# Patient Record
Sex: Female | Born: 1935
Health system: Southern US, Community
[De-identification: ages and names within clinical notes are randomized; demographics above are authoritative.]

## PROBLEM LIST (undated history)

## (undated) DIAGNOSIS — T8859XA Other complications of anesthesia, initial encounter: Secondary | ICD-10-CM

## (undated) DIAGNOSIS — Z8489 Family history of other specified conditions: Secondary | ICD-10-CM

## (undated) DIAGNOSIS — E079 Disorder of thyroid, unspecified: Secondary | ICD-10-CM

## (undated) DIAGNOSIS — I7300 Raynaud's syndrome without gangrene: Secondary | ICD-10-CM

## (undated) DIAGNOSIS — Z8619 Personal history of other infectious and parasitic diseases: Secondary | ICD-10-CM

## (undated) DIAGNOSIS — I10 Essential (primary) hypertension: Secondary | ICD-10-CM

## (undated) DIAGNOSIS — T4145XA Adverse effect of unspecified anesthetic, initial encounter: Secondary | ICD-10-CM

## (undated) DIAGNOSIS — I8390 Asymptomatic varicose veins of unspecified lower extremity: Secondary | ICD-10-CM

## (undated) DIAGNOSIS — M722 Plantar fascial fibromatosis: Secondary | ICD-10-CM

## (undated) DIAGNOSIS — C50919 Malignant neoplasm of unspecified site of unspecified female breast: Secondary | ICD-10-CM

## (undated) DIAGNOSIS — M549 Dorsalgia, unspecified: Secondary | ICD-10-CM

## (undated) DIAGNOSIS — J189 Pneumonia, unspecified organism: Secondary | ICD-10-CM

## (undated) DIAGNOSIS — E039 Hypothyroidism, unspecified: Secondary | ICD-10-CM

## (undated) DIAGNOSIS — K589 Irritable bowel syndrome without diarrhea: Secondary | ICD-10-CM

## (undated) DIAGNOSIS — J449 Chronic obstructive pulmonary disease, unspecified: Secondary | ICD-10-CM

## (undated) DIAGNOSIS — K219 Gastro-esophageal reflux disease without esophagitis: Secondary | ICD-10-CM

## (undated) HISTORY — DX: Raynaud's syndrome without gangrene: I73.00

## (undated) HISTORY — PX: COLONOSCOPY: SHX174

## (undated) HISTORY — DX: Chronic obstructive pulmonary disease, unspecified: J44.9

## (undated) HISTORY — DX: Essential (primary) hypertension: I10

## (undated) HISTORY — DX: Malignant neoplasm of unspecified site of unspecified female breast: C50.919

## (undated) HISTORY — DX: Plantar fascial fibromatosis: M72.2

## (undated) HISTORY — DX: Irritable bowel syndrome, unspecified: K58.9

## (undated) HISTORY — DX: Hemochromatosis, unspecified: E83.119

## (undated) HISTORY — PX: CATARACT EXTRACTION: SUR2

## (undated) HISTORY — PX: APPENDECTOMY: SHX54

## (undated) HISTORY — DX: Dorsalgia, unspecified: M54.9

## (undated) HISTORY — DX: Disorder of thyroid, unspecified: E07.9

---

## 1968-04-13 HISTORY — PX: OTHER SURGICAL HISTORY: SHX169

## 1977-04-13 HISTORY — PX: ABDOMINAL HYSTERECTOMY: SHX81

## 1989-04-13 HISTORY — PX: LUMBAR LAMINECTOMY: SHX95

## 1992-04-13 HISTORY — PX: MASTECTOMY: SHX3

## 1999-10-30 ENCOUNTER — Other Ambulatory Visit: Admission: RE | Admit: 1999-10-30 | Discharge: 1999-10-30 | Payer: Self-pay | Admitting: *Deleted

## 2000-01-06 ENCOUNTER — Other Ambulatory Visit: Admission: RE | Admit: 2000-01-06 | Discharge: 2000-01-06 | Payer: Self-pay | Admitting: Ophthalmology

## 2000-01-17 ENCOUNTER — Inpatient Hospital Stay (HOSPITAL_COMMUNITY): Admission: EM | Admit: 2000-01-17 | Discharge: 2000-01-19 | Payer: Self-pay | Admitting: Emergency Medicine

## 2000-01-17 ENCOUNTER — Encounter: Payer: Self-pay | Admitting: Emergency Medicine

## 2000-01-19 ENCOUNTER — Encounter: Payer: Self-pay | Admitting: Cardiovascular Disease

## 2000-01-19 ENCOUNTER — Encounter: Payer: Self-pay | Admitting: Internal Medicine

## 2000-06-07 ENCOUNTER — Ambulatory Visit (HOSPITAL_COMMUNITY): Admission: RE | Admit: 2000-06-07 | Discharge: 2000-06-07 | Payer: Self-pay | Admitting: Endocrinology

## 2000-06-07 ENCOUNTER — Encounter: Payer: Self-pay | Admitting: Endocrinology

## 2001-01-05 ENCOUNTER — Other Ambulatory Visit: Admission: RE | Admit: 2001-01-05 | Discharge: 2001-01-05 | Payer: Self-pay | Admitting: *Deleted

## 2001-08-17 ENCOUNTER — Encounter: Admission: RE | Admit: 2001-08-17 | Discharge: 2001-08-17 | Payer: Self-pay | Admitting: Gastroenterology

## 2001-08-17 ENCOUNTER — Encounter: Payer: Self-pay | Admitting: Gastroenterology

## 2001-08-23 ENCOUNTER — Encounter: Payer: Self-pay | Admitting: Gastroenterology

## 2001-08-23 ENCOUNTER — Ambulatory Visit (HOSPITAL_COMMUNITY): Admission: RE | Admit: 2001-08-23 | Discharge: 2001-08-23 | Payer: Self-pay | Admitting: Gastroenterology

## 2001-10-11 ENCOUNTER — Encounter (HOSPITAL_COMMUNITY): Admission: RE | Admit: 2001-10-11 | Discharge: 2002-01-09 | Payer: Self-pay | Admitting: Gastroenterology

## 2002-01-10 ENCOUNTER — Encounter (HOSPITAL_COMMUNITY): Admission: RE | Admit: 2002-01-10 | Discharge: 2002-04-10 | Payer: Self-pay | Admitting: Gastroenterology

## 2002-04-11 ENCOUNTER — Encounter (HOSPITAL_COMMUNITY): Admission: RE | Admit: 2002-04-11 | Discharge: 2002-07-10 | Payer: Self-pay | Admitting: Gastroenterology

## 2002-08-01 ENCOUNTER — Encounter (HOSPITAL_COMMUNITY): Admission: RE | Admit: 2002-08-01 | Discharge: 2002-10-30 | Payer: Self-pay | Admitting: Gastroenterology

## 2002-11-14 ENCOUNTER — Encounter (HOSPITAL_COMMUNITY): Admission: RE | Admit: 2002-11-14 | Discharge: 2003-02-12 | Payer: Self-pay | Admitting: Gastroenterology

## 2003-02-27 ENCOUNTER — Encounter (HOSPITAL_COMMUNITY): Admission: RE | Admit: 2003-02-27 | Discharge: 2003-05-28 | Payer: Self-pay | Admitting: Gastroenterology

## 2003-03-13 ENCOUNTER — Ambulatory Visit (HOSPITAL_COMMUNITY): Admission: RE | Admit: 2003-03-13 | Discharge: 2003-03-13 | Payer: Self-pay | Admitting: Internal Medicine

## 2003-03-29 ENCOUNTER — Other Ambulatory Visit: Admission: RE | Admit: 2003-03-29 | Discharge: 2003-03-29 | Payer: Self-pay | Admitting: *Deleted

## 2003-07-19 ENCOUNTER — Encounter (HOSPITAL_COMMUNITY): Admission: RE | Admit: 2003-07-19 | Discharge: 2003-10-17 | Payer: Self-pay | Admitting: Gastroenterology

## 2003-11-14 ENCOUNTER — Encounter (HOSPITAL_COMMUNITY): Admission: RE | Admit: 2003-11-14 | Discharge: 2004-02-12 | Payer: Self-pay | Admitting: Gastroenterology

## 2004-03-14 ENCOUNTER — Ambulatory Visit (HOSPITAL_COMMUNITY): Admission: RE | Admit: 2004-03-14 | Discharge: 2004-03-14 | Payer: Self-pay

## 2004-04-09 ENCOUNTER — Encounter: Admission: RE | Admit: 2004-04-09 | Discharge: 2004-04-09 | Payer: Self-pay | Admitting: Gastroenterology

## 2004-04-18 ENCOUNTER — Ambulatory Visit: Payer: Self-pay | Admitting: Oncology

## 2004-08-06 ENCOUNTER — Ambulatory Visit: Payer: Self-pay | Admitting: Oncology

## 2004-09-02 ENCOUNTER — Ambulatory Visit: Payer: Self-pay | Admitting: Internal Medicine

## 2004-09-05 ENCOUNTER — Ambulatory Visit (HOSPITAL_COMMUNITY): Admission: RE | Admit: 2004-09-05 | Discharge: 2004-09-05 | Payer: Self-pay | Admitting: Internal Medicine

## 2004-10-21 ENCOUNTER — Ambulatory Visit: Payer: Self-pay | Admitting: Internal Medicine

## 2004-12-26 ENCOUNTER — Ambulatory Visit: Payer: Self-pay | Admitting: Oncology

## 2005-03-09 ENCOUNTER — Ambulatory Visit: Payer: Self-pay | Admitting: Oncology

## 2005-04-24 ENCOUNTER — Ambulatory Visit: Payer: Self-pay | Admitting: Internal Medicine

## 2005-06-30 ENCOUNTER — Ambulatory Visit: Payer: Self-pay | Admitting: Oncology

## 2005-11-30 ENCOUNTER — Ambulatory Visit: Payer: Self-pay | Admitting: Oncology

## 2005-12-04 LAB — COMPREHENSIVE METABOLIC PANEL
ALT: 10 U/L (ref 0–40)
CO2: 26 mEq/L (ref 19–32)
Creatinine, Ser: 0.72 mg/dL (ref 0.40–1.20)
Total Bilirubin: 0.6 mg/dL (ref 0.3–1.2)

## 2005-12-04 LAB — CBC WITH DIFFERENTIAL/PLATELET
BASO%: 0.4 % (ref 0.0–2.0)
Eosinophils Absolute: 0.3 10*3/uL (ref 0.0–0.5)
LYMPH%: 22.2 % (ref 14.0–48.0)
MCH: 32.7 pg (ref 26.0–34.0)
MCHC: 35 g/dL (ref 32.0–36.0)
MCV: 93.4 fL (ref 81.0–101.0)
MONO%: 9.4 % (ref 0.0–13.0)
Platelets: 283 10*3/uL (ref 145–400)
RBC: 4.47 10*6/uL (ref 3.70–5.32)

## 2005-12-04 LAB — FERRITIN: Ferritin: 43 ng/mL (ref 10–291)

## 2006-01-04 ENCOUNTER — Ambulatory Visit: Payer: Self-pay | Admitting: Internal Medicine

## 2006-01-07 ENCOUNTER — Ambulatory Visit: Payer: Self-pay | Admitting: Internal Medicine

## 2006-01-27 ENCOUNTER — Ambulatory Visit: Payer: Self-pay | Admitting: Internal Medicine

## 2006-03-30 ENCOUNTER — Ambulatory Visit: Payer: Self-pay | Admitting: Oncology

## 2006-04-02 LAB — IRON AND TIBC
%SAT: 56 % — ABNORMAL HIGH (ref 20–55)
Iron: 167 ug/dL — ABNORMAL HIGH (ref 42–145)
UIBC: 133 ug/dL

## 2006-04-02 LAB — CBC WITH DIFFERENTIAL/PLATELET
Eosinophils Absolute: 0.2 10*3/uL (ref 0.0–0.5)
MONO#: 0.6 10*3/uL (ref 0.1–0.9)
MONO%: 8 % (ref 0.0–13.0)
NEUT#: 5.1 10*3/uL (ref 1.5–6.5)
RBC: 4.69 10*6/uL (ref 3.70–5.32)
RDW: 12.2 % (ref 11.3–14.5)
WBC: 7.3 10*3/uL (ref 3.9–10.0)

## 2006-04-02 LAB — COMPREHENSIVE METABOLIC PANEL
ALT: 16 U/L (ref 0–35)
CO2: 31 mEq/L (ref 19–32)
Creatinine, Ser: 0.71 mg/dL (ref 0.40–1.20)
Total Bilirubin: 0.7 mg/dL (ref 0.3–1.2)

## 2006-04-02 LAB — FERRITIN: Ferritin: 63 ng/mL (ref 10–291)

## 2006-10-05 ENCOUNTER — Ambulatory Visit: Payer: Self-pay | Admitting: Oncology

## 2006-10-08 LAB — CBC WITH DIFFERENTIAL/PLATELET
Basophils Absolute: 0 10*3/uL (ref 0.0–0.1)
Eosinophils Absolute: 0.2 10*3/uL (ref 0.0–0.5)
HCT: 39.1 % (ref 34.8–46.6)
LYMPH%: 19.8 % (ref 14.0–48.0)
MCHC: 35.7 g/dL (ref 32.0–36.0)
MONO#: 0.5 10*3/uL (ref 0.1–0.9)
NEUT%: 68.7 % (ref 39.6–76.8)
Platelets: 250 10*3/uL (ref 145–400)
WBC: 6.5 10*3/uL (ref 3.9–10.0)

## 2007-01-06 ENCOUNTER — Encounter: Payer: Self-pay | Admitting: *Deleted

## 2007-01-06 DIAGNOSIS — K589 Irritable bowel syndrome without diarrhea: Secondary | ICD-10-CM | POA: Insufficient documentation

## 2007-01-06 DIAGNOSIS — M549 Dorsalgia, unspecified: Secondary | ICD-10-CM | POA: Insufficient documentation

## 2007-01-06 DIAGNOSIS — Z9079 Acquired absence of other genital organ(s): Secondary | ICD-10-CM | POA: Insufficient documentation

## 2007-01-06 DIAGNOSIS — E039 Hypothyroidism, unspecified: Secondary | ICD-10-CM | POA: Insufficient documentation

## 2007-01-06 DIAGNOSIS — Z9010 Acquired absence of unspecified breast and nipple: Secondary | ICD-10-CM | POA: Insufficient documentation

## 2007-01-06 DIAGNOSIS — C50919 Malignant neoplasm of unspecified site of unspecified female breast: Secondary | ICD-10-CM | POA: Insufficient documentation

## 2007-01-06 DIAGNOSIS — I10 Essential (primary) hypertension: Secondary | ICD-10-CM | POA: Insufficient documentation

## 2007-03-01 ENCOUNTER — Telehealth: Payer: Self-pay | Admitting: Internal Medicine

## 2007-03-18 ENCOUNTER — Encounter: Payer: Self-pay | Admitting: Internal Medicine

## 2007-03-25 ENCOUNTER — Ambulatory Visit: Payer: Self-pay | Admitting: Internal Medicine

## 2007-03-25 LAB — CONVERTED CEMR LAB
BUN: 22 mg/dL (ref 6–23)
Basophils Absolute: 0 10*3/uL (ref 0.0–0.1)
Basophils Relative: 0.6 % (ref 0.0–1.0)
CO2: 31 meq/L (ref 19–32)
Calcium: 9.3 mg/dL (ref 8.4–10.5)
Chloride: 100 meq/L (ref 96–112)
Creatinine, Ser: 0.9 mg/dL (ref 0.4–1.2)
Eosinophils Absolute: 0.3 10*3/uL (ref 0.0–0.6)
Eosinophils Relative: 4.4 % (ref 0.0–5.0)
Ferritin: 82.6 ng/mL (ref 10.0–291.0)
Free T4: 1 ng/dL (ref 0.6–1.6)
GFR calc Af Amer: 79 mL/min
GFR calc non Af Amer: 65 mL/min
Glucose, Bld: 103 mg/dL — ABNORMAL HIGH (ref 70–99)
HCT: 43.6 % (ref 36.0–46.0)
Hemoglobin: 15.2 g/dL — ABNORMAL HIGH (ref 12.0–15.0)
Lymphocytes Relative: 24.4 % (ref 12.0–46.0)
MCHC: 34.8 g/dL (ref 30.0–36.0)
MCV: 95.5 fL (ref 78.0–100.0)
Monocytes Absolute: 0.7 10*3/uL (ref 0.2–0.7)
Monocytes Relative: 11.1 % — ABNORMAL HIGH (ref 3.0–11.0)
Neutro Abs: 3.5 10*3/uL (ref 1.4–7.7)
Neutrophils Relative %: 59.5 % (ref 43.0–77.0)
Platelets: 289 10*3/uL (ref 150–400)
Potassium: 3.7 meq/L (ref 3.5–5.1)
RBC: 4.56 M/uL (ref 3.87–5.11)
RDW: 11.6 % (ref 11.5–14.6)
Sodium: 139 meq/L (ref 135–145)
TSH: 7.31 microintl units/mL — ABNORMAL HIGH (ref 0.35–5.50)
WBC: 6 10*3/uL (ref 4.5–10.5)

## 2007-03-27 ENCOUNTER — Encounter: Payer: Self-pay | Admitting: Internal Medicine

## 2007-03-31 ENCOUNTER — Ambulatory Visit: Payer: Self-pay | Admitting: Internal Medicine

## 2007-04-12 ENCOUNTER — Ambulatory Visit: Payer: Self-pay | Admitting: Oncology

## 2007-04-15 ENCOUNTER — Encounter: Payer: Self-pay | Admitting: Internal Medicine

## 2007-04-29 ENCOUNTER — Telehealth: Payer: Self-pay | Admitting: Internal Medicine

## 2007-05-06 ENCOUNTER — Encounter: Payer: Self-pay | Admitting: Internal Medicine

## 2007-05-09 ENCOUNTER — Encounter: Admission: RE | Admit: 2007-05-09 | Discharge: 2007-05-09 | Payer: Self-pay | Admitting: *Deleted

## 2007-06-06 ENCOUNTER — Encounter: Payer: Self-pay | Admitting: Internal Medicine

## 2007-06-28 ENCOUNTER — Ambulatory Visit: Payer: Self-pay | Admitting: Internal Medicine

## 2007-06-28 DIAGNOSIS — R55 Syncope and collapse: Secondary | ICD-10-CM | POA: Insufficient documentation

## 2007-07-06 ENCOUNTER — Ambulatory Visit: Payer: Self-pay | Admitting: Oncology

## 2007-07-13 LAB — COMPREHENSIVE METABOLIC PANEL
Albumin: 4.4 g/dL (ref 3.5–5.2)
CO2: 32 mEq/L (ref 19–32)
Calcium: 9.8 mg/dL (ref 8.4–10.5)
Glucose, Bld: 130 mg/dL — ABNORMAL HIGH (ref 70–99)
Potassium: 3.7 mEq/L (ref 3.5–5.3)
Sodium: 141 mEq/L (ref 135–145)
Total Protein: 6.7 g/dL (ref 6.0–8.3)

## 2007-07-15 ENCOUNTER — Encounter: Payer: Self-pay | Admitting: Internal Medicine

## 2007-07-20 ENCOUNTER — Ambulatory Visit: Payer: Self-pay | Admitting: Internal Medicine

## 2007-07-21 ENCOUNTER — Encounter: Payer: Self-pay | Admitting: Internal Medicine

## 2007-08-05 ENCOUNTER — Encounter: Payer: Self-pay | Admitting: Internal Medicine

## 2007-08-08 ENCOUNTER — Encounter: Payer: Self-pay | Admitting: Internal Medicine

## 2007-09-12 ENCOUNTER — Ambulatory Visit: Payer: Self-pay | Admitting: Oncology

## 2007-09-14 LAB — CBC WITH DIFFERENTIAL/PLATELET
BASO%: 0.3 % (ref 0.0–2.0)
EOS%: 4.5 % (ref 0.0–7.0)
LYMPH%: 20.7 % (ref 14.0–48.0)
MCH: 33 pg (ref 26.0–34.0)
MCHC: 35.7 g/dL (ref 32.0–36.0)
MONO#: 0.4 10*3/uL (ref 0.1–0.9)
RBC: 4.68 10*6/uL (ref 3.70–5.32)
WBC: 5.7 10*3/uL (ref 3.9–10.0)
lymph#: 1.2 10*3/uL (ref 0.9–3.3)

## 2007-09-14 LAB — COMPREHENSIVE METABOLIC PANEL
ALT: 13 U/L (ref 0–35)
AST: 19 U/L (ref 0–37)
Alkaline Phosphatase: 76 U/L (ref 39–117)
BUN: 21 mg/dL (ref 6–23)
Calcium: 9.1 mg/dL (ref 8.4–10.5)
Chloride: 103 mEq/L (ref 96–112)
Creatinine, Ser: 0.76 mg/dL (ref 0.40–1.20)
Potassium: 3.8 mEq/L (ref 3.5–5.3)

## 2007-09-14 LAB — FERRITIN: Ferritin: 32 ng/mL (ref 10–291)

## 2007-10-18 ENCOUNTER — Encounter: Payer: Self-pay | Admitting: Internal Medicine

## 2007-10-18 ENCOUNTER — Encounter: Admission: RE | Admit: 2007-10-18 | Discharge: 2007-10-18 | Payer: Self-pay | Admitting: Gastroenterology

## 2007-11-18 ENCOUNTER — Encounter: Payer: Self-pay | Admitting: Internal Medicine

## 2008-01-11 ENCOUNTER — Ambulatory Visit: Payer: Self-pay | Admitting: Oncology

## 2008-01-13 LAB — CBC WITH DIFFERENTIAL/PLATELET
EOS%: 5.8 % (ref 0.0–7.0)
LYMPH%: 19.8 % (ref 14.0–48.0)
MCH: 33.4 pg (ref 26.0–34.0)
MCHC: 35.5 g/dL (ref 32.0–36.0)
MCV: 94.1 fL (ref 81.0–101.0)
MONO%: 9 % (ref 0.0–13.0)
RBC: 4.74 10*6/uL (ref 3.70–5.32)
RDW: 12.6 % (ref 11.3–14.5)

## 2008-01-13 LAB — FERRITIN: Ferritin: 69 ng/mL (ref 10–291)

## 2008-01-18 ENCOUNTER — Other Ambulatory Visit: Admission: RE | Admit: 2008-01-18 | Discharge: 2008-01-18 | Payer: Self-pay | Admitting: Obstetrics and Gynecology

## 2008-02-15 ENCOUNTER — Ambulatory Visit (HOSPITAL_COMMUNITY): Admission: RE | Admit: 2008-02-15 | Discharge: 2008-02-15 | Payer: Self-pay | Admitting: Obstetrics and Gynecology

## 2008-03-21 ENCOUNTER — Ambulatory Visit: Payer: Self-pay | Admitting: Internal Medicine

## 2008-03-22 ENCOUNTER — Telehealth: Payer: Self-pay | Admitting: Internal Medicine

## 2008-05-16 ENCOUNTER — Ambulatory Visit: Payer: Self-pay | Admitting: Oncology

## 2008-05-18 LAB — COMPREHENSIVE METABOLIC PANEL
ALT: 19 U/L (ref 0–35)
AST: 20 U/L (ref 0–37)
Albumin: 4.2 g/dL (ref 3.5–5.2)
Alkaline Phosphatase: 92 U/L (ref 39–117)
BUN: 17 mg/dL (ref 6–23)
Chloride: 98 mEq/L (ref 96–112)
Potassium: 3.6 mEq/L (ref 3.5–5.3)
Sodium: 138 mEq/L (ref 135–145)

## 2008-05-18 LAB — CBC WITH DIFFERENTIAL/PLATELET
BASO%: 0.4 % (ref 0.0–2.0)
EOS%: 2.2 % (ref 0.0–7.0)
MCH: 32.5 pg (ref 26.0–34.0)
MCHC: 34.6 g/dL (ref 32.0–36.0)
MCV: 93.9 fL (ref 81.0–101.0)
MONO%: 4.9 % (ref 0.0–13.0)
RBC: 4.71 10*6/uL (ref 3.70–5.32)
RDW: 12.2 % (ref 11.3–14.5)
lymph#: 1.1 10*3/uL (ref 0.9–3.3)

## 2008-05-21 ENCOUNTER — Encounter: Payer: Self-pay | Admitting: Internal Medicine

## 2008-05-23 ENCOUNTER — Encounter: Payer: Self-pay | Admitting: Internal Medicine

## 2008-09-17 ENCOUNTER — Ambulatory Visit: Payer: Self-pay | Admitting: Oncology

## 2008-09-19 LAB — FERRITIN: Ferritin: 48 ng/mL (ref 10–291)

## 2008-11-19 ENCOUNTER — Encounter: Payer: Self-pay | Admitting: Internal Medicine

## 2009-01-07 ENCOUNTER — Encounter: Payer: Self-pay | Admitting: Internal Medicine

## 2009-01-14 ENCOUNTER — Ambulatory Visit: Payer: Self-pay | Admitting: Oncology

## 2009-01-16 LAB — FERRITIN: Ferritin: 83 ng/mL (ref 10–291)

## 2009-02-14 ENCOUNTER — Telehealth: Payer: Self-pay | Admitting: Internal Medicine

## 2009-02-15 ENCOUNTER — Ambulatory Visit: Payer: Self-pay | Admitting: Internal Medicine

## 2009-02-15 LAB — CONVERTED CEMR LAB
BUN: 15 mg/dL (ref 6–23)
CO2: 30 meq/L (ref 19–32)
Calcium: 8.9 mg/dL (ref 8.4–10.5)
Chloride: 102 meq/L (ref 96–112)
Creatinine, Ser: 0.7 mg/dL (ref 0.4–1.2)
Free T4: 1 ng/dL (ref 0.6–1.6)
GFR calc non Af Amer: 86.44 mL/min (ref >60)
Glucose, Bld: 87 mg/dL (ref 70–99)
Potassium: 4.1 meq/L (ref 3.5–5.1)
Sodium: 142 meq/L (ref 135–145)
TSH: 1.22 microintl units/mL (ref 0.35–5.50)
Vit D, 25-Hydroxy: 47 ng/mL (ref 30–89)
Vitamin B-12: 578 pg/mL (ref 211–911)

## 2009-02-18 ENCOUNTER — Ambulatory Visit: Payer: Self-pay | Admitting: Internal Medicine

## 2009-02-18 DIAGNOSIS — L659 Nonscarring hair loss, unspecified: Secondary | ICD-10-CM | POA: Insufficient documentation

## 2009-03-20 ENCOUNTER — Encounter: Payer: Self-pay | Admitting: Internal Medicine

## 2009-05-13 ENCOUNTER — Ambulatory Visit: Payer: Self-pay | Admitting: Oncology

## 2009-05-15 ENCOUNTER — Encounter: Payer: Self-pay | Admitting: Internal Medicine

## 2009-05-15 LAB — CBC WITH DIFFERENTIAL/PLATELET
BASO%: 0.2 % (ref 0.0–2.0)
Basophils Absolute: 0 10*3/uL (ref 0.0–0.1)
EOS%: 1.8 % (ref 0.0–7.0)
Eosinophils Absolute: 0.2 10*3/uL (ref 0.0–0.5)
HCT: 44.5 % (ref 34.8–46.6)
HGB: 15.2 g/dL (ref 11.6–15.9)
LYMPH%: 12 % — ABNORMAL LOW (ref 14.0–49.7)
MCH: 33 pg (ref 25.1–34.0)
MCHC: 34.2 g/dL (ref 31.5–36.0)
MCV: 96.4 fL (ref 79.5–101.0)
MONO#: 0.5 10*3/uL (ref 0.1–0.9)
MONO%: 5.1 % (ref 0.0–14.0)
NEUT#: 8 10*3/uL — ABNORMAL HIGH (ref 1.5–6.5)
NEUT%: 80.9 % — ABNORMAL HIGH (ref 38.4–76.8)
Platelets: 272 10*3/uL (ref 145–400)
RBC: 4.62 10*6/uL (ref 3.70–5.45)
RDW: 12.1 % (ref 11.2–14.5)
WBC: 9.9 10*3/uL (ref 3.9–10.3)
lymph#: 1.2 10*3/uL (ref 0.9–3.3)

## 2009-05-15 LAB — COMPREHENSIVE METABOLIC PANEL
ALT: 15 U/L (ref 0–35)
AST: 24 U/L (ref 0–37)
Albumin: 4.1 g/dL (ref 3.5–5.2)
Alkaline Phosphatase: 89 U/L (ref 39–117)
BUN: 16 mg/dL (ref 6–23)
CO2: 26 mEq/L (ref 19–32)
Calcium: 9 mg/dL (ref 8.4–10.5)
Chloride: 99 mEq/L (ref 96–112)
Creatinine, Ser: 0.67 mg/dL (ref 0.40–1.20)
Glucose, Bld: 95 mg/dL (ref 70–99)
Potassium: 3.9 mEq/L (ref 3.5–5.3)
Sodium: 138 mEq/L (ref 135–145)
Total Bilirubin: 0.8 mg/dL (ref 0.3–1.2)
Total Protein: 6.9 g/dL (ref 6.0–8.3)

## 2009-05-15 LAB — FERRITIN: Ferritin: 88 ng/mL (ref 10–291)

## 2009-05-22 ENCOUNTER — Encounter: Payer: Self-pay | Admitting: Internal Medicine

## 2009-07-04 ENCOUNTER — Ambulatory Visit: Payer: Self-pay | Admitting: Oncology

## 2009-07-04 LAB — FERRITIN: Ferritin: 38 ng/mL (ref 10–291)

## 2009-10-11 ENCOUNTER — Ambulatory Visit: Payer: Self-pay | Admitting: Oncology

## 2009-10-16 LAB — FERRITIN: Ferritin: 51 ng/mL (ref 10–291)

## 2009-11-25 ENCOUNTER — Encounter: Payer: Self-pay | Admitting: Internal Medicine

## 2009-12-24 ENCOUNTER — Ambulatory Visit: Payer: Self-pay | Admitting: Internal Medicine

## 2009-12-24 DIAGNOSIS — R109 Unspecified abdominal pain: Secondary | ICD-10-CM | POA: Insufficient documentation

## 2009-12-24 LAB — CONVERTED CEMR LAB
BUN: 18 mg/dL (ref 6–23)
Basophils Absolute: 0 10*3/uL (ref 0.0–0.1)
Basophils Relative: 0.3 % (ref 0.0–3.0)
Bilirubin Urine: NEGATIVE
Blood in Urine, dipstick: NEGATIVE
CO2: 32 meq/L (ref 19–32)
Calcium: 9.2 mg/dL (ref 8.4–10.5)
Chloride: 101 meq/L (ref 96–112)
Creatinine, Ser: 0.7 mg/dL (ref 0.4–1.2)
Eosinophils Absolute: 0.1 10*3/uL (ref 0.0–0.7)
Eosinophils Relative: 1.5 % (ref 0.0–5.0)
Free T4: 1.19 ng/dL (ref 0.60–1.60)
GFR calc non Af Amer: 82.17 mL/min (ref >60)
Glucose, Bld: 80 mg/dL (ref 70–99)
Glucose, Urine, Semiquant: NEGATIVE
HCT: 43.6 % (ref 36.0–46.0)
Hemoglobin: 15.1 g/dL — ABNORMAL HIGH (ref 12.0–15.0)
Ketones, urine, test strip: NEGATIVE
Lymphocytes Relative: 13.1 % (ref 12.0–46.0)
Lymphs Abs: 1.2 10*3/uL (ref 0.7–4.0)
MCHC: 34.5 g/dL (ref 30.0–36.0)
MCV: 96.8 fL (ref 78.0–100.0)
Monocytes Absolute: 0.5 10*3/uL (ref 0.1–1.0)
Monocytes Relative: 5.1 % (ref 3.0–12.0)
Neutro Abs: 7.4 10*3/uL (ref 1.4–7.7)
Neutrophils Relative %: 80 % — ABNORMAL HIGH (ref 43.0–77.0)
Nitrite: NEGATIVE
Platelets: 252 10*3/uL (ref 150.0–400.0)
Potassium: 4.1 meq/L (ref 3.5–5.1)
Protein, U semiquant: NEGATIVE
RBC: 4.51 M/uL (ref 3.87–5.11)
RDW: 12.4 % (ref 11.5–14.6)
Sodium: 141 meq/L (ref 135–145)
Specific Gravity, Urine: <1.005
T3 Uptake Ratio: 39.8 % — ABNORMAL HIGH (ref 22.5–37.0)
TSH: 1 microintl units/mL (ref 0.35–5.50)
Urobilinogen, UA: 0.2
WBC Urine, dipstick: NEGATIVE
WBC: 9.2 10*3/uL (ref 4.5–10.5)
pH: 5

## 2010-01-01 ENCOUNTER — Telehealth: Payer: Self-pay | Admitting: Internal Medicine

## 2010-02-03 ENCOUNTER — Ambulatory Visit: Payer: Self-pay | Admitting: Oncology

## 2010-02-06 ENCOUNTER — Encounter: Payer: Self-pay | Admitting: Internal Medicine

## 2010-04-18 ENCOUNTER — Encounter
Admission: RE | Admit: 2010-04-18 | Discharge: 2010-04-18 | Payer: Self-pay | Source: Home / Self Care | Attending: General Surgery | Admitting: General Surgery

## 2010-04-18 ENCOUNTER — Encounter: Payer: Self-pay | Admitting: Internal Medicine

## 2010-05-13 ENCOUNTER — Ambulatory Visit: Payer: Self-pay | Admitting: Oncology

## 2010-05-13 LAB — COMPREHENSIVE METABOLIC PANEL
ALT: 13 U/L (ref 0–35)
AST: 21 U/L (ref 0–37)
Albumin: 4.2 g/dL (ref 3.5–5.2)
Alkaline Phosphatase: 78 U/L (ref 39–117)
BUN: 15 mg/dL (ref 6–23)
CO2: 28 mEq/L (ref 19–32)
Chloride: 99 mEq/L (ref 96–112)
Glucose, Bld: 94 mg/dL (ref 70–99)
Potassium: 3.9 mEq/L (ref 3.5–5.3)
Sodium: 137 mEq/L (ref 135–145)
Total Bilirubin: 0.7 mg/dL (ref 0.3–1.2)
Total Protein: 6.8 g/dL (ref 6.0–8.3)

## 2010-05-13 LAB — CBC WITH DIFFERENTIAL/PLATELET
BASO%: 0.3 % (ref 0.0–2.0)
Basophils Absolute: 0 10*3/uL (ref 0.0–0.1)
EOS%: 2.9 % (ref 0.0–7.0)
Eosinophils Absolute: 0.2 10*3/uL (ref 0.0–0.5)
HCT: 43.2 % (ref 34.8–46.6)
LYMPH%: 13.7 % — ABNORMAL LOW (ref 14.0–49.7)
MCH: 33 pg (ref 25.1–34.0)
MCHC: 35 g/dL (ref 31.5–36.0)
MCV: 94.4 fL (ref 79.5–101.0)
MONO%: 6.1 % (ref 0.0–14.0)
NEUT#: 5.7 10*3/uL (ref 1.5–6.5)
RBC: 4.57 10*6/uL (ref 3.70–5.45)
RDW: 12.1 % (ref 11.2–14.5)

## 2010-05-13 LAB — FERRITIN: Ferritin: 50 ng/mL (ref 10–291)

## 2010-05-13 NOTE — Progress Notes (Signed)
Summary: Rf and Lab questions  Phone Note Call from Patient   Caller: Patient Reason for Call: Talk to Doctor Summary of Call: pt is requesting Rf on thyroid med and Trim/HCTZ.  She states she also has ? about recent labs. Initial call taken by: Lanier Prude, [facility]),  January 01, 2010 10:34 AM  Follow-up for Phone Call        lmoam for pt to call back Follow-up by: Ami Bullins CMA,  January 01, 2010 12:21 PM  Additional Follow-up for Phone Call Additional follow up Details #1::        pt concerned will her neutraphils being high on her labs. She is also concerned with her hair falling out and what should be done with her thyroid. Do you want to change any dose on her levothyroxine. Please Advise  Additional Follow-up by: Ami Bullins CMA,  January 01, 2010 12:27 PM    Additional Follow-up for Phone Call Additional follow up Details #2::    1) white count is normal and having 80% segment neurtraphils is inconsequential  2) Thyroid functions are in normal range with minial increase in T3U which is not significant. No change in dose of levothyroxine  3) for hair loss - at this point consult with dermatology is the next step.  Follow-up by: Jacques Navy MD,  January 01, 2010 1:14 PM  Additional Follow-up for Phone Call Additional follow up Details #3:: Details for Additional Follow-up Action Taken: Notified pt with md recommendations. Pt sates ok but need refills on both meds [facility]. [facility] Additional Follow-up by: Orlan Leavens RMA,  January 02, 2010 3:00 PM  Prescriptions: TRIAMTERENE-HCTZ 37.5-25 MG TABS (TRIAMTERENE-HCTZ) 1 once daily  #90 x 3   Entered by:   Orlan Leavens RMA   Authorized by:   Jacques Navy MD   Signed by:   Orlan Leavens RMA on 01/02/2010   Method used:   Electronically to        [facility]* (retail)       [address] [place], [place]  [address]       Ph: [phone]  Fax: [phone]   RxID:   [id number] LEVOXYL 75 MCG  TABS (LEVOTHYROXINE SODIUM) 1 once daily  #90 x 3   Entered by:   Orlan Leavens RMA   Authorized by:   Jacques Navy MD   Signed by:   Orlan Leavens RMA on 01/02/2010   Method used:   Electronically to        [facility]* (retail)       [address] [place], [place]  [address]       Ph: [phone]       Fax: [phone]   RxID:   [id number]

## 2010-05-13 NOTE — Assessment & Plan Note (Signed)
Summary: HAIR LOSS, STOMCH PAIN/NWS #   Vital Signs:  Patient profile:   77 year old female Height:      63 inches Weight:      107 pounds BMI:     19.02 O2 Sat:      94 % on Room air Temp:     97.5 degrees F oral Pulse rate:   103 / minute BP sitting:   124 / 62  (left arm) Cuff size:   regular  Vitals Entered By: Bill Salinas CMA (December 24, 2009 11:26 AM)  O2 Flow:  Room air CC: office visit to address several concerns 1. hair loss x 2 months 2. abd distention and pain 3. urinary freq./ ab Comments Pt will get flu shot today   Primary Care Provider:  Norins  CC:  office visit to address several concerns 1. hair loss x 2 months 2. abd distention and pain 3. urinary freq./ ab.  History of Present Illness: Patient presents with several issues:  Urinary frequency, mild dysuria. No fever or flank pain.   Dull aching lower abdominal pain that is intermittent. For the past week the discomfort has been worse and more  noticeable.E-chart and EMR records reviewed.  Last colonoscopy in '04 was normal. She has had transvaginal U/S '09- no ovaries visualized but no adnexal or pelvic abnormality. She has had Abdominal U/S x 3 '09 that were negative.  Hairloss increased over the past two months. Not handfulls but notices when she washes her hair or has her hair done she has had abnormal hair follicle.   Current Medications (verified): 1)  Levoxyl 75 Mcg  Tabs (Levothyroxine Sodium) .Marland Kitchen.. 1 Once Daily 2)  Triamterene-Hctz 37.5-25 Mg Tabs (Triamterene-Hctz) .Marland Kitchen.. 1 Once Daily 3)  Biotin 10 Mg Tabs (Biotin) .... Daily 4)  Claritin 10 Mg Tabs (Loratadine) .... As Needed  Allergies (verified): 1)  ! * Ivp Dye 2)  ! Sulfa  Past History:  Past Medical History: Last updated: Apr 25, 2007 CARCINOMA, BREAST, RIGHT (ICD-174.9)-'94 HYPOTHYROIDISM (ICD-244.9) HEMOCHROMATOSIS (ICD-275.0) IBS (ICD-564.1) BACK PAIN (ICD-724.5) HYPERTENSION (ICD-401.9) plantar fascititis    Physician  roster:        Ortho - [facility]        Onc - Darrold Span        Derm - [facility]        GI - [facility]        Gyn- [facility]        GS- [facility]  Past Surgical History: Last updated: 04/25/2007 MASTECTOMY, RIGHT, HX OF (ICD-V45.71) '94 TOTAL ABDOMINAL HYSTERECTOMY, HX OF (ICD-V45.77) '79- ovarian growth/embryonic cysts. Abnl PAP  Lumbar laminectomy '91 L5-S1 Appendectomy '79  Family History: Last updated: 04/25/07 father - died 52; CAD/MI,RA, HTN, (?) hemachromatosis mother- died 48; metastatic cancer (pancreatic vs lung), HTN, OA, Hypothyroid brother - hemachromatosis, MI; other brother healthy Neg- breast cancer, DM  Social History: Last updated: 04-25-2007 [employer] work: Scientist, product/process development. married '53-widowed '02 1 son- '55 grandchildren 2 lives alone: I-ADLs  Review of Systems       The patient complains of abdominal pain.  The patient denies anorexia, fever, weight loss, weight gain, decreased hearing, chest pain, syncope, dyspnea on exertion, prolonged cough, melena, hematochezia, severe indigestion/heartburn, incontinence, suspicious skin lesions, difficulty walking, depression, abnormal bleeding, and enlarged lymph nodes.    Physical Exam  General:  WNWD white female in no distress Head:  normocephalic and atraumatic.   Eyes:  pupils equal and pupils round.  C&S clear Neck:  supple.  Chest Wall:  no deformities and no tenderness.   Lungs:  normal respiratory effort and normal breath sounds.   Heart:  normal rate and regular rhythm.   Abdomen:  soft, normal bowel sounds, no distention, no masses, no guarding, no rigidity, no abdominal hernia, and no hepatomegaly.  Mild tenderness in the lower quadrants Msk:  normal ROM, no joint swelling, no joint warmth, and no redness over joints.   Pulses:  2+ radial Extremities:  No peripheral edema Neurologic:  alert & oriented X3, cranial nerves II-XII intact, and gait normal.   Skin:  turgor normal and color normal.   Scalp without lesions or abnormalities. No dandruff. Hair of normal texture. No visible folliculitis or folicular changes Cervical Nodes:  no anterior cervical adenopathy and no posterior cervical adenopathy.   Psych:  Oriented X3, normally interactive, good eye contact, and slightly anxious.     Impression & Recommendations:  Problem # 1:  ABDOMINAL PAIN OTHER SPECIFIED SITE (ICD-789.09) Normal exam. Previous imaging studies normal with no evidence of ovarian disease. U/A negative  Plan - r/o infection - CBC           No imaging at this time.           if discomfort is persistent or worsens will refer to GI  Orders: TLB-CBC Platelet - w/Differential (85025-CBCD)  Addendum - CBC normal  Problem # 2:  ALOPECIA (ICD-704.00) No visible abnormality scalp or hair.  Plan - check thyroid function           may need to revisit dermatology : previous exam with Dr. Terri Piedra was negative.   Problem # 3:  HYPOTHYROIDISM (ICD-244.9) Need to check thyroid function.  Her updated medication list for this problem includes:    Levoxyl 75 Mcg Tabs (Levothyroxine sodium) .Marland Kitchen... 1 once daily  Orders: TLB-TSH (Thyroid Stimulating Hormone) (84443-TSH) TLB-T4 (Thyrox), Free [id number]) TLB-T3 Uptake (40981-X9JY)  Addendum - labs are normal with an iconsequential elevation in T3 uptake.  Plan - continue present medication  Problem # 4:  HYPERTENSION (ICD-401.9)  Her updated medication list for this problem includes:    Triamterene-hctz 37.5-25 Mg Tabs (Triamterene-hctz) .Marland Kitchen... 1 once daily  Orders: TLB-BMP (Basic Metabolic Panel-BMET) (80048-METABOL)  BP today: 124/62 Prior BP: 138/66 (02/18/2009)  Good control on present medications. Labs are normal.  Problem # 5:  HEMOCHROMATOSIS (ICD-275.0) Had recent [facility]bot[facility]. She is stable  Complete Medication List: 1)  Levoxyl 75 Mcg Tabs (Levothyroxine sodium) .Marland Kitchen.. 1 once daily 2)  Triamterene-hctz 37.5-25 Mg Tabs (Triamterene-hctz) .Marland Kitchen..  1 once daily 3)  Biotin 10 Mg Tabs (Biotin) .... Daily 4)  Claritin 10 Mg Tabs (Loratadine) .... As needed  Other Orders: Flu Vaccine 15yrs + MEDICARE PATIENTS (N8295) Administration Flu vaccine - MCR (A2130)   Patient: Brandy Peterson Note: All result statuses are Final unless otherwise noted.  Tests: (1) TSH (TSH)   FastTSH                   1.00 uIU/mL                 0.35-5.50  Tests: (2) T4, Free (FT4R)   Free T4                   1.19 ng/dL                  0.60-1.60  Tests: (3) T3 Uptake (T3UP)   T3 Uptake            [  H]  39.8 %                      22.5-37.0  Tests: (4) BMP (METABOL)   Sodium                    141 mEq/L                   135-145   Potassium                 4.1 mEq/L                   3.5-5.1   Chloride                  101 mEq/L                   96-112   Carbon Dioxide            32 mEq/L                    19-32   Glucose                   80 mg/dL                    15-17   BUN                       18 mg/dL                    6-16   Creatinine                0.7 mg/dL                   0.7-3.7   Calcium                   9.2 mg/dL                   1.0-62.6   GFR                       82.17 mL/min                >60  Tests: (5) CBC Platelet w/Diff (CBCD)   White Cell Count          9.2 K/uL                    4.5-10.5   Red Cell Count            4.51 Mil/uL                 3.87-5.11   Hemoglobin           [H]  15.1 g/dL                   94.8-54.6   Hematocrit                43.6 %                      36.0-46.0   MCV                       96.8 fl  78.0-100.0   MCHC                      34.5 g/dL                   40.9-81.1   RDW                       12.4 %                      11.5-14.6   Platelet Count            252.0 K/uL                  150.0-400.0   Neutrophil %         [H]  80.0 %                      43.0-77.0   Lymphocyte %              13.1 %                      12.0-46.0   Monocyte %                5.1 %                        3.0-12.0   Eosinophils%              1.5 %                       0.0-5.0   Basophils %               0.3 %                       0.0-3.0   Neutrophill Absolute      7.4 K/uL                    1.4-7.7   Lymphocyte Absolute       1.2 K/uL                    0.7-4.0   Monocyte Absolute         0.5 K/uL                    0.1-1.0  Eosinophils, Absolute                             0.1 K/uL                    0.0-0.7   Basophils Absolute        0.0 K/uL                    0.0-0.1 Laboratory Results   Urine Tests   Date/Time Reported: Ami Bullins CMA  December 24, 2009 11:31 AM   Routine Urinalysis   Color: straw Appearance: Clear Glucose: negative   (Normal Range: Negative) Bilirubin: negative   (Normal Range: Negative) Ketone: negative   (Normal Range: Negative) Spec. Gravity: <1.005   (Normal Range: 1.003-1.035) Blood: negative   (Normal Range: Negative) pH: 5.0   (Normal Range: 5.0-8.0) Protein: negative   (Normal Range: Negative)  Urobilinogen: 0.2   (Normal Range: 0-1) Nitrite: negative   (Normal Range: Negative) Leukocyte Esterace: negative   (Normal Range: Negative)        Flu Vaccine Consent Questions     Do you have a history of severe allergic reactions to this vaccine? no    Any prior history of allergic reactions to egg and/or gelatin? no    Do you have a sensitivity to the preservative Thimersol? no    Do you have a past history of Guillan-Barre Syndrome? no    Do you currently have an acute febrile illness? no    Have you ever had a severe reaction to latex? no    Vaccine information given and explained to patient? yes    Are you currently pregnant? no    Lot Number:[id number]   Exp Date:10/11/2010   Site Given  Left Deltoid IMflu .........Marland KitchenLamar Sprinkles, CMA  December 24, 2009 12:29 PM

## 2010-05-13 NOTE — Letter (Signed)
[facility]   Imported By: Sherian Rein 06/06/2009 09:40:37  _____________________________________________________________________  External Attachment:    Type:   Image     Comment:   External Document

## 2010-05-13 NOTE — Letter (Signed)
Summary: [facility]  [facility]   Imported By: Lester Braggs 02/12/2010 10:14:38  _____________________________________________________________________  External Attachment:    Type:   Image     Comment:   External Document

## 2010-05-15 NOTE — Letter (Signed)
Summary: [facility]   Imported By: Lennie Odor 05/05/2010 14:13:51  _____________________________________________________________________  External Attachment:    Type:   Image     Comment:   External Document

## 2010-05-19 ENCOUNTER — Encounter (HOSPITAL_BASED_OUTPATIENT_CLINIC_OR_DEPARTMENT_OTHER): Payer: Medicare Other | Admitting: Oncology

## 2010-05-19 ENCOUNTER — Encounter: Payer: Self-pay | Admitting: Internal Medicine

## 2010-05-19 DIAGNOSIS — Z853 Personal history of malignant neoplasm of breast: Secondary | ICD-10-CM

## 2010-06-04 NOTE — Letter (Signed)
Summary: [facility]  [facility]   Imported By: Sherian Rein 05/28/2010 11:53:22  _____________________________________________________________________  External Attachment:    Type:   Image     Comment:   External Document

## 2010-08-29 NOTE — H&P (Signed)
[place]. [facility]  Patient:    Brandy Peterson, Brandy Peterson                         MRN: [id number] Adm. Date:  [id number] Disc. Date: [id number] Attending:  Duke Salvia                         History and Physical  CHIEF COMPLAINT: The patient is a 77 year old white female without prior known cardiac disease, who was stable until the morning of admission when she had onset of chest pain while at rest.  HISTORY OF PRESENT ILLNESS: This was described as a severe pressure sensation along the right chest wall area.  It was associated with weakness and profuse diaphoresis.  She had persistent pain and the patient took two of her husbands nitroglycerin, and after the second nitroglycerin tablet the pain resolved after 10-12 minutes.  Also during this period of time following the nitroglycerin use the patient described some dimmed visual acuity.  She became quite weak and faint.  Because of the onset of pain the patient was transported to the ER for evaluation and treatment.  The patient has a two to three year history of right-sided jaw and chest pain that usually occurred at night and was not related to exertion.  The patient had this evaluated approximately two years ago with what sounds like a Cardiolite stress test with apparent negative results.  For the past two years the patient has had some mild exertional substernal chest pain she describes as tightness with walking.  This has been nonprogressive for about two years, and again is mild and transient.  The patient otherwise has been clinically stable, but over the past 24 hours has noticed some nausea and weakness.  She awoke this morning, however, feeling well.  She is now admitted for further evaluation and treatment of her chest pain and to exclude ischemic heart disease.  PAST MEDICAL HISTORY:  1. Right mastectomy in 1995 or 1996 by Dr. Samuella Cota, and is also followed     closely by Dr. Darrold Span.  2.  History of prior tobacco use, but not in approximately ten years.  3. Gravida 1 para 1 abortus 0, with a son.  PAST SURGICAL HISTORY:  1. Remote hysterectomy with incidental appendectomy.  2. Apparently the patient has had some other gynecologic surgery for     embryonal tumor of some sort.  ALLERGIES:  1. IVP DYE.  2. SULFA.  CURRENT MEDICATIONS: She takes a diuretic for fluid retention.  She denies any history of hypertension, hypercholesterolemia, or diabetes.  FAMILY HISTORY: Positive for coronary artery disease.  Father died at age 31 of MI.  A brother required what sounds like thrombolytic therapy for an acute MI while in [place].  Mother died of complications of metastatic cancer, either pancreatic or lung.  REVIEW OF SYSTEMS: Otherwise fairly noncontributory except as mentioned in the History of Present Illness.  PHYSICAL EXAMINATION:  VITAL SIGNS: Blood pressure 100/64, pulse rate 56.  GENERAL: The patient is a well-developed, thin appearing white female, in no acute distress at rest.  HEENT: Normal pupillary responses.  Fundi normal.  Ears, nose, and throat clear.  NECK: No neck vein distention, bruits, or thyroid enlargement.  CHEST: Clear.  CARDIOVASCULAR: Normal S1 and S2, no murmurs or gallops.  BREAST: Right mastectomy site was clean.  There is no axillary adenopathy. Left breast is normal.  ABDOMEN:  Soft, nontender.  No organomegaly.  Bowel sounds active.  No bruits appreciated.  EXTREMITIES: No edema.  Peripheral pulses full.  NEUROLOGIC: Nonfocal.  IMPRESSION:  1. Right anterior chest pain.  Rule out ischemic heart disease.  2. Status post right mastectomy.  PLAN: The patient will be admitted to a telemetry setting and serial cardiac enzymes and electrocardiograms will be reviewed.  The patient will be placed on intravenous heparin and intravenous nitrates.  Beta-blockers will be held in view of her sinus bradycardia and low-normal blood  pressure.  Will place on daily aspirin.  Cardiology is to evaluate. DD:  01/17/00 TD:  01/17/00 Job: [id number]

## 2010-08-29 NOTE — Assessment & Plan Note (Signed)
[facility]                             PRIMARY CARE OFFICE NOTE   NAME:Brandy Peterson, Hosie                         MRN:          [id number]  DATE:01/04/2006                            DOB:          1930-08-26    1. Brandy Peterson is a 77 year old woman followed for several medical problems      who presents for follow up evaluation and exam.  In the interval since      her last visit in January 2007, she has been followed by Dr. Darrold Span      for her hemochromatosis.  She has had recent lab work in August at the      [facility] which revealed a normal comprehensive metabolic panel,      normal ferritin level at 43, and normal CBC with a hemoglobin of 14.6,      hematocrit 41.8, white count 7500, with a normal differential.   2.  GI.  The patient reports she is having persistent problems with gas,      lower abdominal bloating and discomfort, upper abdominal bloating and      discomfort and pain, and irregular bowel habits.  The patient      occasionally gets sharp chest pressures suggestive of reflux.  She has      been diagnosed previously with IBS and currently takes no medications.      She has to follow a high fiber diet.   3.  Rheum.  The patient has had a problem with multiple joint discomforts,      especially her knees.  She also has had an erythematous rash.  She was      evaluated by Dr. Corliss Skains several years ago for possible lupus and did      have, at that time, an ANA of 1 to 80 titer which was inconclusive.   4.  Derm.  The patient has had many small minor lesions treated by Dr. Para Skeans.  She does continue to have a problem with facial tingling,      discomfort, and stinging and erythematous flushing, specifically      associated with heat such as a hot shower.   PAST MEDICAL HISTORY:  SURGICAL:  1. Total abdominal hysterectomy.  2. Mastectomy right September 2004.  MEDICAL:  1. Usual childhood diseases.  2. Hypertension.  3.  History of back pain.  4. IBS.  5. Hemachromatosis.  6. History of breast cancer.  7. Hypothyroid disease.   CURRENT MEDICATIONS:  Calcium with D, multi-vitamins, HydroDIURIL 37.5 mg  daily, Levoxyl 75 mcg daily, ferritin on an as needed basis.   HEALTH MAINTENANCE:  The patient's last pelvic and Pap smear was in August  2007 with Dr. Carey Bullocks.  The patient's last mammogram was July 2007 which was  unremarkable.  The patient's last colonoscopy was in approximately 2002 with  Dr. Vilinda Boehringer.  The patient's last tetanus booster was in 1995.   PHYSICIAN ROSTER:  Vania Rea. Jarold Motto, M.D., [facility], FACP, GI  Lennis P. Darrold Span, M.D., oncology  Sharlet Salina  Lurlean Leyden, M.D., surgery  Pershing Cox, M.D., OB/GYN  Elinor Parkinson. Terri Piedra, M.D., dermatology   REVIEW OF SYMPTOMS:  The patient has had no fevers, sweats, or chills.  Her  weight has been stable.  She has had an eye exam in the last 12-14 months.  No ENT, cardiovascular, or respiratory complaints.  GI as above.  No GU  complaints.  Musculoskeletal as above.  Dermatological as above.   INTERVAL SOCIAL HISTORY:  Unchanged with the patient continuing to live  alone.  She is very independent in all her activities of daily living.   PHYSICAL EXAMINATION:  VITAL SIGNS:  Temperature 96.5, blood pressure 147/63, pulse 68, weight 110.  GENERAL:  Well nourished woman looking younger than her stated chronologic  age in no acute distress.  HEENT:  Normocephalic, atraumatic, EACs with some cerumen, no impaction, TMs  were visualized and normal.  Oropharynx with native dentition in good  repair, she does have a partial denture.  No buccal lesions were noted.  Posterior pharynx was clear.  Conjunctivae and sclerae were clear.  Pupils  equal, round, reactive to light and accommodation.  Funduscopic exam  revealed normal disk margins, no vascular abnormalities were noted.  No raw  spots, no exudates, no hemorrhages.  NECK:  Supple without  thyromegaly.  NODES:  No lymphadenopathy was noted in the cervical or supraclavicular  regions.  CHEST:  No CVA tenderness, lungs were clear to auscultation and percussion.  BREASTS:  Exam deferred to gynecology.  CARDIOVASCULAR:  2+ radial pulse, no JVD, no carotid bruits.  She had a  quiet precordium with a regular rate and rhythm without murmurs, gallops,  and rubs.  ABDOMEN:  Soft, no guarding or rebound.  No organo splenomegaly was noted.  PELVIC AND RECTAL:  Exams deferred to Dr. Carey Bullocks.  EXTREMITIES:  Without cyanosis, clubbing, or edema.  No deformities were  noted.  She moves all her joints well.   LABORATORY DATA:  As noted, the patient had recent laboratory in August 2007  at the [facility].  Last mammogram November 03, 2005, which was read as  normal.  The patient had a 12 lead electrocardiogram today which revealed a  heart rate of 57, this was an unremarkable EKG unchanged from previous  studies.   ASSESSMENT:  1. Hypertension.  The patient's blood pressure is borderline controlled at      today's visit.  I will ask that she have her blood pressure checked at      her convenience at random.  If she continues to run blood pressures      greater than 140 systolic, we need to consider a change in her      medications.  2. Hypothyroid disease.  The patient is due for TSH.  We will get this      done and adjust her medications as indicated.  3. Hemachromatosis.  The patient is followed on a regular basis by Dr.      Jama Flavors.  With the patient's problem with skin stinging and      tingling as well as erythematous macular rash, I would be concerned      with whether she has polycythemia vera with a significant skin reaction      as noted.  We will direct this question to Dr. Darrold Span.  4. Derm.  The patient has been seen by Dr. Para Skeans.  She has had some      minor skin lesions removed, none  of which were evidently worrisome or     cancerous.  At this time, her  examination of the skin is normal.  No      further evaluation at this time but she should return to Dr. Terri Piedra as      needed.  5. GI.  Patient with symptoms that area very suggestive of IBS.  She has      been seen by Dr. Vilinda Boehringer and has had colonoscopy approximately five      years ago.  Will defer to Dr. Randa Evens for recall.   PLAN:  1. The patient is given a prescription for Levsin SL to use on an as      needed basis for bloating, discomfort, gas, and irregular bowel habits.  2. Health maintenance.  The patient is current and up to date with her      gynecologist.  She needs to check with Dr. Randa Evens as to when she is      due for follow up.  In reviewing the patient's chart, she did have a      stress nuclear study in 2000 that was negative.  She had a 2D echo in      2004 that was negative.  No need for cardiac evaluation at this time.   In summary, a pleasant patient with medical problems as outlined above.  She  has had pneumonia vaccine.  The patient is advised that ZOSTAVAX is  available, although given her Medicare part D, she might be better served by  either going to the health department or pharmacy for the vaccine.  She will  be notified as to the results of her laboratory, specifically ANA, to rule  out any significant rheumatologic disorder causing joint pain, TSH, and a  lipid panel for which she is due.                                   Rosalyn Gess Norins, MD   MEN/MedQ  DD:  01/04/2006  DT:  01/05/2006  Job #:  [id number]   cc:   Lennis P. Darrold Span, M.D.  Pershing Cox, M.D.  James L. Malon Kindle., M.D.

## 2010-08-29 NOTE — Discharge Summary (Signed)
. [facility]  Patient:    VELENA, KEEGAN                         MRN: [id number] Adm. Date:  [id number] Disc. Date: [id number] Attending:  Duke Salvia Dictator:   Cornell Barman, P.A. CC:         Rosalyn Gess. Norins, M.D. [facility]   Discharge Summary  DISCHARGE DIAGNOSIS:  Chest pain, myocardial infarction ruled out.  BRIEF ADMISSION HISTORY:  Ms. Macias is a 77 year old white female without prior known coronary artery disease who presented with chest pain that was relieved by nitroglycerin. She had associated shortness of breath and diaphoresis.  PAST MEDICAL HISTORY: 1. History of breast cancer, status post right mastectomy in 1996. 2. Remote tobacco. 3. Status post hysterectomy. 4. Status post appendectomy.  HOSPITAL COURSE:  CHEST PAIN:  The patient was admitted to rule out coronary artery disease.  Cardiology was asked to see the patient.  Noralyn Pick. Eden Emms, M.D., saw the patient on January 17, 2000.  He felt her pain was atypical but would recommend continuing her heparin and pursuing a stress Cardiolite.  The patient did rule out for MI.  Cardiolite was negative and the patient was felt to be stable for discharge.  DISCHARGE MEDICATIONS:  The patient was instructed to resume her medications as at home and add an aspirin 325 mg q.d.  FOLLOW-UP:  The patient was to follow up with Casimiro Needle E. Norins, M.D., in two to three weeks. DD:  01/27/00 TD:  01/27/00 Job: [id number]

## 2010-09-03 ENCOUNTER — Encounter (INDEPENDENT_AMBULATORY_CARE_PROVIDER_SITE_OTHER): Payer: Self-pay | Admitting: General Surgery

## 2010-09-09 ENCOUNTER — Other Ambulatory Visit: Payer: Self-pay | Admitting: Oncology

## 2010-09-09 ENCOUNTER — Encounter (HOSPITAL_BASED_OUTPATIENT_CLINIC_OR_DEPARTMENT_OTHER): Payer: Medicare Other | Admitting: Oncology

## 2010-09-09 LAB — FERRITIN: Ferritin: 45 ng/mL (ref 10–291)

## 2010-12-25 ENCOUNTER — Other Ambulatory Visit: Payer: Self-pay | Admitting: Internal Medicine

## 2010-12-26 ENCOUNTER — Other Ambulatory Visit: Payer: Self-pay | Admitting: Internal Medicine

## 2010-12-26 ENCOUNTER — Other Ambulatory Visit (INDEPENDENT_AMBULATORY_CARE_PROVIDER_SITE_OTHER): Payer: Medicare Other

## 2010-12-26 ENCOUNTER — Ambulatory Visit (INDEPENDENT_AMBULATORY_CARE_PROVIDER_SITE_OTHER): Payer: Medicare Other | Admitting: Internal Medicine

## 2010-12-26 DIAGNOSIS — R7989 Other specified abnormal findings of blood chemistry: Secondary | ICD-10-CM

## 2010-12-26 DIAGNOSIS — E039 Hypothyroidism, unspecified: Secondary | ICD-10-CM

## 2010-12-26 DIAGNOSIS — D489 Neoplasm of uncertain behavior, unspecified: Secondary | ICD-10-CM

## 2010-12-26 DIAGNOSIS — I10 Essential (primary) hypertension: Secondary | ICD-10-CM

## 2010-12-26 LAB — LIPID PANEL
HDL: 85 mg/dL (ref >39.00)
Triglycerides: 54 mg/dL (ref 0.0–149.0)

## 2010-12-26 LAB — T4, FREE: Free T4: 1.95 ng/dL — ABNORMAL HIGH (ref 0.60–1.60)

## 2010-12-26 LAB — TSH: TSH: 2.53 u[IU]/mL (ref 0.35–5.50)

## 2010-12-26 NOTE — Assessment & Plan Note (Signed)
Lab Results  Component Value Date   TSH 1.00 12/24/2009   Patient due for follow-up lab with recommendations as to thyroid replacement dose as indicated.

## 2010-12-26 NOTE — Assessment & Plan Note (Signed)
BP Readings from Last 3 Encounters:  12/26/10 150/68  12/24/09 124/62  02/18/09 138/66   Newly elevated blood pressure  Plan - continue present medication           Monitor BP as outpatient and report back. Medication adjustments as indicated

## 2010-12-26 NOTE — Progress Notes (Signed)
Subjective:    Patient ID: Brandy Peterson, female    DOB: [birth year], 77 y.o.   MRN: [id number]  HPI Brandy Peterson presents with concern for a new lesion at the underside of her left breast at the 5 o'clock position. There was initially some itching which has subsided. She has had no pain, no new lymph nodes.  She is due for follow-up thyroid labs as well a lipid testing due to previous abnormal labs.  Her Blood pressure is elevated today and she reports that when in other medical settings her pressure has been high. She has not monitored her blood pressure at other times.  She reports that she had an episode of dysequilibrium and ataxia without any associated symptoms that resolved spontaneously without sequelae.   Past Medical History  Diagnosis Date  . Raynaud's syndrome   . Hemochromatosis   . Thyroid disease   . IBS (irritable bowel syndrome)   . Backache, unspecified   . Hypertension   . Carcinoma of breast     Right  . Plantar fasciitis    Past Surgical History  Procedure Date  . Bone spur 1970    BOTH FEET  . Abdominal hysterectomy 1979    PARTIAL  . Mastectomy 1994    RIGHT  . Mastectomy 1994    Right  . Appendectomy   . Lumbar laminectomy 1991    L5-S1   Family History  Problem Relation Age of Onset  . Cancer Mother     metastatic cancer (pancreas vs lung)  . Hypertension Mother   . Osteoarthritis Mother   . Hypothyroidism Mother   . Heart disease Father     CAD/MI  . Hypertension Father   . Arthritis Father   . Hemochromatosis Father     Possible  . Diabetes Neg Hx   . Hemochromatosis Brother    History   Social History  . Marital Status: Widowed    Spouse Name: N/A    Number of Children: N/A  . Years of Education: N/A   Occupational History  . Not on file.   Social History Main Topics  . Smoking status: Unknown If Ever Smoked  . Smokeless tobacco: Not on file  . Alcohol Use: Yes     2 OZ, 5 TIMES A WEEK  . Drug Use:   . Sexually Active:      Other Topics Concern  . 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 She now lives alone I-ADLs. Pt has 1 son born 18 and 2 grandchildren.        Review of Systems System review is negative for any constitutional, cardiac, pulmonary, GI or neuro symptoms or complaints     Objective:   Physical Exam Vitals reviewed - BP noted Gen'l - WNWD white woman in no distress HEENT - EAC's /TMs normal, C&S clear, globes normal Pulm - normal respirations Cor - 2+ radial, RRR Derm- tan raised 1 cm lesion at the 5 o'clock position left breast, smooth edge, no dark discoloration   Procedure Note :     Procedure :  Skin biopsy   Indication: Suspicious lesion   Risks including unsuccessful procedure , bleeding, infection, bruising, scar, a need for another complete procedure and others were explained to the patient in detail as well as the benefits. Informed consent was obtained and signed.   The patient was placed in a  supine position.  Lesion #1 on left breast at 5 o'clock  measuring   10 mm   Skin over lesion #1  was prepped with Betadine and alcohol  and anesthetized with 1 cc of 2% lidocaine and epinephrine, using a 30-gauge 1 inch needle.  Shave biopsy with a sterile Dermablade was carried out in the usual fashion. It was attempted to biopsy with  1 mm free margins.  Hyfrecator was used to destroy the rest of the lesion potentially left behind and for hemostasis. Band-Aid was applied.  Postprocedure instructions :    A Band-Aid should be  changed twice daily. You can take a shower tomorrow.  Keep the wounds clean. You can wash them with liquid soap and water. Pat dry with gauze or a Kleenex tissue  Before applying antibiotic ointment and a Band-Aid.   You need to report immediately  if fever, chills or any signs of infection develop.    The biopsy results should be available in 1  -2 weeks.        Assessment & Plan:  Skin lesion of uncertain behavior. successful shave biopsy. Specimen to pathology

## 2010-12-30 ENCOUNTER — Encounter: Payer: Self-pay | Admitting: Internal Medicine

## 2011-01-02 ENCOUNTER — Telehealth: Payer: Self-pay | Admitting: *Deleted

## 2011-01-02 NOTE — Telephone Encounter (Signed)
Pt calling in to report BP readings so Dr Debby Bud can determine if her BP meds needs to be changed 136/56 Pulse 69   138/56 Pulse 59   121/58 Pulse 63   110/58 Pulse 64  Pt states that her breast where Mole was cut off was weeping for one day, but states it is healing well.

## 2011-01-04 NOTE — Telephone Encounter (Signed)
BP's are ok. I appreciate her sending report. Do I need to look at the wound site?

## 2011-01-05 ENCOUNTER — Other Ambulatory Visit: Payer: Self-pay | Admitting: *Deleted

## 2011-01-05 MED ORDER — TRIAMTERENE-HCTZ 37.5-25 MG PO CAPS: 1.0000 | ORAL_CAPSULE | ORAL | Status: DC

## 2011-01-05 MED ORDER — LEVOTHYROXINE SODIUM 75 MCG PO TABS: 75.0000 ug | ORAL_TABLET | Freq: Every day | ORAL | Status: DC

## 2011-01-05 NOTE — Telephone Encounter (Signed)
Call path report to her: seborrheic keratosis - inflammed. 100% benign! thanks

## 2011-01-05 NOTE — Telephone Encounter (Signed)
Spoke with pt and informed her. Pt states her site on her breast has turned into a scab. She states its doing much better

## 2011-01-06 ENCOUNTER — Telehealth: Payer: Self-pay | Admitting: *Deleted

## 2011-01-06 NOTE — Telephone Encounter (Signed)
Pt informed

## 2011-01-06 NOTE — Telephone Encounter (Signed)
Spoke w/pharm, pt has been getting Rx for triam/hctz cap but wants tablets due to cost. Gave verbal ok to pharmacist to use tabs.

## 2011-01-14 ENCOUNTER — Other Ambulatory Visit: Payer: Self-pay | Admitting: Oncology

## 2011-01-14 ENCOUNTER — Encounter (HOSPITAL_BASED_OUTPATIENT_CLINIC_OR_DEPARTMENT_OTHER): Payer: Medicare Other | Admitting: Oncology

## 2011-01-14 LAB — FERRITIN: Ferritin: 57 ng/mL (ref 10–291)

## 2011-01-22 ENCOUNTER — Ambulatory Visit (HOSPITAL_COMMUNITY)
Admission: RE | Admit: 2011-01-22 | Discharge: 2011-01-22 | Disposition: A | Discharge status: Home / Self Care | Payer: Medicare Other | Source: Ambulatory Visit | Attending: Oncology | Admitting: Oncology

## 2011-02-18 ENCOUNTER — Encounter (INDEPENDENT_AMBULATORY_CARE_PROVIDER_SITE_OTHER): Payer: Self-pay

## 2011-02-27 ENCOUNTER — Telehealth: Payer: Self-pay

## 2011-02-27 DIAGNOSIS — E039 Hypothyroidism, unspecified: Secondary | ICD-10-CM

## 2011-02-27 NOTE — Telephone Encounter (Signed)
Patient called [facility]mov[facility] stating that she had labs done 12/2010 and received a letter advising to recheck in 4mos. Patient would like a call back letting her know when she may come in to have her TSH and lipid checked.

## 2011-02-28 NOTE — Telephone Encounter (Signed)
Order entered for Dec 17 or after

## 2011-03-02 NOTE — Telephone Encounter (Signed)
Patient notified

## 2011-03-31 ENCOUNTER — Other Ambulatory Visit (INDEPENDENT_AMBULATORY_CARE_PROVIDER_SITE_OTHER): Payer: Medicare Other

## 2011-03-31 DIAGNOSIS — E039 Hypothyroidism, unspecified: Secondary | ICD-10-CM

## 2011-04-01 LAB — TSH: TSH: 6.41 u[IU]/mL — ABNORMAL HIGH (ref 0.35–5.50)

## 2011-04-08 ENCOUNTER — Encounter: Payer: Self-pay | Admitting: Internal Medicine

## 2011-04-08 MED ORDER — LEVOTHYROXINE SODIUM 88 MCG PO TABS: 88.0000 ug | ORAL_TABLET | Freq: Every day | ORAL | Status: DC

## 2011-04-24 ENCOUNTER — Ambulatory Visit (INDEPENDENT_AMBULATORY_CARE_PROVIDER_SITE_OTHER): Payer: Medicare Other | Admitting: General Surgery

## 2011-04-24 DIAGNOSIS — Z853 Personal history of malignant neoplasm of breast: Secondary | ICD-10-CM | POA: Diagnosis not present

## 2011-04-24 DIAGNOSIS — C50919 Malignant neoplasm of unspecified site of unspecified female breast: Secondary | ICD-10-CM | POA: Diagnosis not present

## 2011-04-24 NOTE — Progress Notes (Signed)
History: Patient returns for yearly followup with a history of 1.8 cm node-negative ER/PR positive carcinoma the right breast status post modified mastectomy by Dr. Samuella Cota and subsequent 5 years of tamoxifen. She reports she is doing well. She denies any changes in her left breast, right chest wall changes, unusual pain, or other complaints. Imaging last year at [facility] was negative.  Exam: Gen.: Thin elderly female in no distress Skin: Somewhat atrophic, no rash or infection HEENT: No palpable masses or thyromegaly. Lymph nodes: No palpable cervical, supraclavicular, or axillary lymph nodes Breasts: Right chest wall well-healed with no skin changes or chest wall nodules. The axilla is negative. The left breast shows some mild nodularity but no mass, skin change or nipple changes.  Assessment and plan: Doing well with no evidence of new breast cancer were late recurrence. She is to return in one year.

## 2011-05-01 ENCOUNTER — Telehealth: Payer: Self-pay | Admitting: Oncology

## 2011-05-01 NOTE — Telephone Encounter (Signed)
Talked to pt, gave her appt for end of January for labs and MD visit on 05/22/11

## 2011-05-11 ENCOUNTER — Other Ambulatory Visit (HOSPITAL_BASED_OUTPATIENT_CLINIC_OR_DEPARTMENT_OTHER): Payer: Medicare Other | Admitting: Lab

## 2011-05-11 LAB — COMPREHENSIVE METABOLIC PANEL
Albumin: 4.1 g/dL (ref 3.5–5.2)
CO2: 29 mEq/L (ref 19–32)
Glucose, Bld: 93 mg/dL (ref 70–99)
Potassium: 4 mEq/L (ref 3.5–5.3)
Sodium: 138 mEq/L (ref 135–145)
Total Protein: 6.6 g/dL (ref 6.0–8.3)

## 2011-05-11 LAB — CBC WITH DIFFERENTIAL/PLATELET
Eosinophils Absolute: 0.3 10*3/uL (ref 0.0–0.5)
MONO#: 0.4 10*3/uL (ref 0.1–0.9)
NEUT#: 5.6 10*3/uL (ref 1.5–6.5)
Platelets: 255 10*3/uL (ref 145–400)
RBC: 4.64 10*6/uL (ref 3.70–5.45)
RDW: 12.6 % (ref 11.2–14.5)
WBC: 7.7 10*3/uL (ref 3.9–10.3)

## 2011-05-15 ENCOUNTER — Telehealth: Payer: Self-pay

## 2011-05-15 ENCOUNTER — Other Ambulatory Visit: Payer: Self-pay | Admitting: Oncology

## 2011-05-15 NOTE — Telephone Encounter (Signed)
SPOKE WITH MS. Muscarella AND TOLD HER THAT HER FERRITIN WAS 54 ON 05-11-11.  SHE IS SET UP FOR PHLEBOTOMY AFTER HER VISIT WITH DR. Darrold Span ON 05-22-11.

## 2011-05-21 ENCOUNTER — Telehealth: Payer: Self-pay | Admitting: *Deleted

## 2011-05-21 NOTE — Telephone Encounter (Signed)
Received call from pt this am stating that she may not be able to make appt tomorrow for Dr. Darrold Span & phlebotomy b/c her brother is at [facility] & she doesn't really know what to expect.  She will call back later & speak to schedulers if she knows she definitely won't come.  Note to Dr.Livesay.

## 2011-05-22 ENCOUNTER — Ambulatory Visit: Payer: Medicare Other

## 2011-05-22 ENCOUNTER — Ambulatory Visit (HOSPITAL_BASED_OUTPATIENT_CLINIC_OR_DEPARTMENT_OTHER): Payer: Medicare Other | Admitting: Oncology

## 2011-05-22 DIAGNOSIS — C50919 Malignant neoplasm of unspecified site of unspecified female breast: Secondary | ICD-10-CM

## 2011-05-22 DIAGNOSIS — Z853 Personal history of malignant neoplasm of breast: Secondary | ICD-10-CM

## 2011-05-22 NOTE — Progress Notes (Signed)
1145-523 grams removed without difficulty from left ac from [id number]. Pressure dressing applied to site. Pt tolerated without difficulty.  Pt ate snack and drink prior to discharge.  Pt discharged to home at 1145 with dressing clean, dry and intact to left ac.  Pt without any questions or concerns at this time.

## 2011-05-23 ENCOUNTER — Encounter: Payer: Self-pay | Admitting: Oncology

## 2011-05-23 NOTE — Progress Notes (Signed)
OFFICE PROGRESS NOTE Date of Visit: 05-22-11 Physicians: M.Norins, J.Edwards, B.Hoxworth  INTERVAL HISTORY:   Patient is seen, alone for visit today, in continuing attention to her history of hemochromatosis, also remote history of stage 1 breast cancer. We follow ferritin levels and phlebotomize to keep < or equal to 50; she had phlebotomy in Feb and Oct 2012 and x3 in 2011. Most recent ferritin was 54 on 05-11-11 and she has been scheduled for phlebotomy with visit today. The breast cancer was T1N0 (five axillary nodes) in 1994, treated with mastectomy with the node evaluation, five years of tamoxifen and subsequently on observation. Last mammograms were at [facility] 12-25-10 without findings of concern. Patient is upset today as her brother is hospitalized at [facility] following a CVA; he also has hemochromatosis. She has had no recent infectious illnesses, no respiratory or cardiac symptoms, appetite and energy generally good, no new or different pain, no changes on breast self-exam.Remainder of full 10 point Review of Systems is negative. She has pushed fluids today prior to phlebotomy.  Objective:  Vital signs in last 24 hours:  BP 156/74  Pulse 64  Temp(Src) 96.6 F (35.9 C) (Oral)  Ht 5' 1.5" (1.562 m)  Wt 109 lb (49.442 kg)  BMI 20.26 kg/m2 Easily mobile, NAD. Wearing gloves due to Reynaud's.   HEENT:mucous membranes moist, pharynx normal without lesions.PERRL. Hair somewhat thin. LymphaticsCervical, supraclavicular, and axillary nodes normal.No inguinal adenopathy Resp: clear to auscultation bilaterally and normal percussion bilaterally Cardio: regular rate and rhythm GI: soft, non-tender; bowel sounds normal; no masses,  no organomegaly. Liver edge not felt. Extremities: extremities normal, atraumatic, no cyanosis or edema Breasts: right mastectomy scar without evidence of local recurrence. Nothing in right axilla, no swelling RUE. Left breast without dominant mass, skin or nipple  findings and axilla is benign.    Lab Results: from 05-11-11 WBC 7.7, ANC 5.6, Hgb 15.1, Plt 255, MCV 95.5 Ferritin 54 CMET entirely normal including Tb 0.6, AP 79, AST 25, ALT 18 BMET  Studies/Results:  No results found.  Medications: I have reviewed the patient's current medications.  Assessment/Plan:  1. Hemachromatosis: will phlebotomize today and repeat ferritin in 6 months. I will see her in a year or sooner if needed 2. Remote history of breast cancer as above. Mammograms at [facility] in Sept 2013 3.Remote tobacco use, discontinued over 20 yrs ago        Reece Packer, MD   05/23/2011, 4:45 PM

## 2011-08-14 ENCOUNTER — Encounter (INDEPENDENT_AMBULATORY_CARE_PROVIDER_SITE_OTHER): Payer: Self-pay

## 2011-10-22 ENCOUNTER — Telehealth: Payer: Self-pay | Admitting: Oncology

## 2011-10-22 NOTE — Telephone Encounter (Signed)
pt called into r/s 8/8 lab to  8/15     done.aware aom

## 2011-11-19 ENCOUNTER — Other Ambulatory Visit: Payer: Medicare Other | Admitting: Lab

## 2011-11-26 ENCOUNTER — Other Ambulatory Visit (HOSPITAL_BASED_OUTPATIENT_CLINIC_OR_DEPARTMENT_OTHER): Payer: Medicare Other | Admitting: Lab

## 2011-11-26 LAB — CBC WITH DIFFERENTIAL/PLATELET
BASO%: 0.4 % (ref 0.0–2.0)
EOS%: 3.2 % (ref 0.0–7.0)
LYMPH%: 23 % (ref 14.0–49.7)
MCH: 32.9 pg (ref 25.1–34.0)
MCHC: 35 g/dL (ref 31.5–36.0)
MCV: 94.1 fL (ref 79.5–101.0)
MONO%: 9.3 % (ref 0.0–14.0)
Platelets: 241 10*3/uL (ref 145–400)
RBC: 4.42 10*6/uL (ref 3.70–5.45)

## 2011-11-26 LAB — FERRITIN: Ferritin: 50 ng/mL (ref 10–291)

## 2011-11-27 ENCOUNTER — Other Ambulatory Visit: Payer: Self-pay | Admitting: Internal Medicine

## 2011-11-27 ENCOUNTER — Telehealth: Payer: Self-pay

## 2011-11-27 NOTE — Telephone Encounter (Signed)
Spoke with Brandy Peterson and told her that her ferritin level was 50.  Dr. Darrold Span feels that it is ok to just recheck level in Feb. 2014 as scheduled. Pt. Stated that Is comfortable with this plan.

## 2011-11-30 ENCOUNTER — Other Ambulatory Visit: Payer: Self-pay

## 2011-11-30 ENCOUNTER — Other Ambulatory Visit: Payer: Self-pay | Admitting: Oncology

## 2011-11-30 ENCOUNTER — Telehealth: Payer: Self-pay

## 2011-11-30 NOTE — Telephone Encounter (Signed)
Brandy Peterson after talkling with Dr. Precious Reel nurse last week about ferritin being 50 felt that she would like to have a phlebotomy this week.  She has been a little tired and has experienced some abd. pain as she had when first diagnosed.

## 2011-11-30 NOTE — Progress Notes (Signed)
Spoke with Ms. Brandy Peterson and told her that Dr. Darrold Span is ok for her to have a phlebotomy for ferritin of  50 and with symptoms of fatigue and abd. Pain.  Told her of appt. On 12-03-11 at 0900.

## 2011-11-30 NOTE — Telephone Encounter (Signed)
Told Ms. Delucchi that Dr. Darrold Span is fine with a phlebotomy for her.  It is scheduled for 0900 on 12-03-11.

## 2011-12-01 DIAGNOSIS — Z1231 Encounter for screening mammogram for malignant neoplasm of breast: Secondary | ICD-10-CM | POA: Diagnosis not present

## 2011-12-03 ENCOUNTER — Encounter: Payer: Self-pay | Admitting: Internal Medicine

## 2011-12-03 ENCOUNTER — Ambulatory Visit (INDEPENDENT_AMBULATORY_CARE_PROVIDER_SITE_OTHER): Payer: Medicare Other | Admitting: Internal Medicine

## 2011-12-03 ENCOUNTER — Ambulatory Visit (HOSPITAL_BASED_OUTPATIENT_CLINIC_OR_DEPARTMENT_OTHER): Payer: Medicare Other

## 2011-12-03 ENCOUNTER — Ambulatory Visit: Payer: Medicare Other

## 2011-12-03 VITALS — BP 140/72 | HR 62 | Temp 97.4°F | Resp 16 | Wt 105.0 lb

## 2011-12-03 DIAGNOSIS — I10 Essential (primary) hypertension: Secondary | ICD-10-CM | POA: Diagnosis not present

## 2011-12-03 MED ORDER — LISINOPRIL 2.5 MG PO TABS: 2.5000 mg | ORAL_TABLET | Freq: Every day | ORAL | Status: DC

## 2011-12-03 NOTE — Progress Notes (Signed)
Subjective:    Patient ID: Brandy Peterson, female    DOB: 1930-10-11, 78 y.o.   MRN: [id number]  HPI Mrs. Warwick presents acutely for elevated BP: at [facility] she had BPO 176/69, 170/68. She reports that at home she will have SBP in the 150-160 range. She has not had any symptoms: no nosebleed, double vision, neurologic changes, visual changes.  Past Medical History  Diagnosis Date  . Raynaud's syndrome   . Hemochromatosis   . Thyroid disease   . IBS (irritable bowel syndrome)   . Backache, unspecified   . Hypertension   . Carcinoma of breast     Right  . Plantar fasciitis    Past Surgical History  Procedure Date  . Bone spur 1970    BOTH FEET  . Abdominal hysterectomy 1979    PARTIAL  . Mastectomy 1994    RIGHT  . Mastectomy 1994    Right  . Appendectomy   . Lumbar laminectomy 1991    L5-S1   Family History  Problem Relation Age of Onset  . Cancer Mother     metastatic cancer (pancreas vs lung)  . Hypertension Mother   . Osteoarthritis Mother   . Hypothyroidism Mother   . Heart disease Father     CAD/MI  . Hypertension Father   . Arthritis Father   . Hemochromatosis Father     Possible  . Diabetes Neg Hx   . Hemochromatosis Brother    History   Social History  . Marital Status: Widowed    Spouse Name: N/A    Number of Children: N/A  . Years of Education: N/A   Occupational History  . Not on file.   Social History Main Topics  . Smoking status: Former Smoker    Types: Cigarettes    Quit date: 04/13/1988  . Smokeless tobacco: Not on file   Comment: approximate quit date  . Alcohol Use: Yes     2 OZ, 5 TIMES A WEEK  . Drug Use: Not on file  . Sexually Active: Not on file   Other Topics Concern  . 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 She now lives alone I-ADLs. Pt has 1 son born 27 and 2 grandchildren.     Current Outpatient Prescriptions on  File Prior to Visit  Medication Sig Dispense Refill  . Biotin 5000 MCG CAPS Take by mouth.        . levothyroxine (SYNTHROID, LEVOTHROID) 88 MCG tablet Take 1 tablet (88 mcg total) by mouth daily.  90 tablet  3  . Loratadine-Pseudoephedrine (CLARITIN-D 12 HOUR PO) Take by mouth.        . Omega-3 Fatty Acids (FISH OIL PO) Take 1,000 mg by mouth daily.       Marland Kitchen triamterene-hydrochlorothiazide (MAXZIDE-25) 37.5-25 MG per tablet TAKE ONE TABLET BY MOUTH EVERY DAY  90 tablet  0  . triamterene-hydrochlorothiazide (DYAZIDE) 37.5-25 MG per capsule Take 1 each (1 capsule total) by mouth every morning.  90 capsule  3      Review of Systems    System review is negative for any constitutional, cardiac, pulmonary, GI or neuro symptoms or complaints other than as described in the HPI.  Objective:   Physical Exam Filed Vitals:   12/03/11 1027  BP: 140/72  Pulse: 62  Temp: 97.4 F (36.3 C)  Resp: 16   Repeat BP by  examiner- 142/74  Gen'l thin woman in no distress HEENT- Fundi with normal vessels, no exudate or hemorrhage Cor 2+ radial, RRR Pulm - normal respirations.       Assessment & Plan:

## 2011-12-03 NOTE — Progress Notes (Addendum)
Ferritin 50, phlebotomy indicated.  Phlebotomy using aseptic tech, 500gm removed.  Pt tolerated well.  No bleeding noted on dc.   dmr

## 2011-12-03 NOTE — Patient Instructions (Addendum)
Mild hypertension - elevated at the cancer center but close to normal in the office at 142/74.  Plan Continue maxide  Add lisinopril 2.5 mg (very low dose) once a day  Return in 21-28 days for BP check.

## 2011-12-05 NOTE — Assessment & Plan Note (Addendum)
BP Readings from Last 3 Encounters:  12/03/11 140/72  12/03/11 170/68  05/22/11 150/70   Patient has had more frequent elevations in BP  Plan Continue maxide  Add ACE-I at low dose: lisinopril 2.5 mg once a day  ROV for BP check 18-21 days.

## 2011-12-29 ENCOUNTER — Encounter: Payer: Self-pay | Admitting: Internal Medicine

## 2011-12-29 ENCOUNTER — Ambulatory Visit (INDEPENDENT_AMBULATORY_CARE_PROVIDER_SITE_OTHER): Payer: Medicare Other | Admitting: Internal Medicine

## 2011-12-29 VITALS — BP 160/70 | HR 67 | Temp 96.6°F | Resp 16 | Wt 104.0 lb

## 2011-12-29 DIAGNOSIS — Z23 Encounter for immunization: Secondary | ICD-10-CM

## 2011-12-29 DIAGNOSIS — J309 Allergic rhinitis, unspecified: Secondary | ICD-10-CM | POA: Diagnosis not present

## 2011-12-29 DIAGNOSIS — E039 Hypothyroidism, unspecified: Secondary | ICD-10-CM | POA: Diagnosis not present

## 2011-12-29 DIAGNOSIS — I10 Essential (primary) hypertension: Secondary | ICD-10-CM

## 2011-12-29 MED ORDER — FLUTICASONE PROPIONATE 50 MCG/ACT NA SUSP: 1.0000 | Freq: Every day | NASAL | Status: DC

## 2011-12-29 NOTE — Assessment & Plan Note (Signed)
Blood pressure - the readings that you showed are all below the threshold for additional medication. The pharmacy blood pressure machines are reasonably accurate. You can also bring in your machine to compare to ours. Please continue to take the maxide. Before starting any other medication please discontinue the decongestant which is a definite cause of elevated blood pressure. If you continue to have high readings we will consider a different class of blood pressure medication.

## 2011-12-29 NOTE — Patient Instructions (Addendum)
Blood pressure - the readings that you showed are all below the threshold for additional medication. The pharmacy blood pressure machines are reasonably accurate. You can also bring in your machine to compare to ours. Please continue to take the maxide. Before starting any other medication please discontinue the decongestant which is a definite cause of elevated blood pressure. If you continue to have high readings we will consider a different class of blood pressure medication.   Allergy - start flonase spray, 1 spray to each nostril once a day. If after 5 days you are not getting adequate relief you may increase this to 1 spray to each nostril twice a day. Please gargle or brush your teeth after use. You may continue the claritin-D for 2-3 days while the flonase takes affect.

## 2011-12-29 NOTE — Assessment & Plan Note (Signed)
Allergy - start flonase spray, 1 spray to each nostril once a day. If after 5 days you are not getting adequate relief you may increase this to 1 spray to each nostril twice a day. Please gargle or brush your teeth after use. You may continue the claritin-D for 2-3 days while the flonase takes affect.

## 2012-01-02 NOTE — Progress Notes (Signed)
Subjective:    Patient ID: Margorie John, female    DOB: 07-26-1930, 78 y.o.   MRN: [id number]  HPI Ms. Ortlieb presents for evaluation of elevated BP readings. She has had several elevated readings at local drugstore. She has been asymptomatic: no headache, epistaxis, change in vision, focal neurologic symptoms, chest pain or shortness of breath.  It is allergy season for her and she is having congestion and rhinorrhea. She is advised against the use of otc decongestants in a setting of blood pressure. She has no h/o to suggest infection.   Past Medical History  Diagnosis Date  . Raynaud's syndrome   . Hemochromatosis   . Thyroid disease   . IBS (irritable bowel syndrome)   . Backache, unspecified   . Hypertension   . Carcinoma of breast     Right  . Plantar fasciitis    Past Surgical History  Procedure Date  . Bone spur 1970    BOTH FEET  . Abdominal hysterectomy 1979    PARTIAL  . Mastectomy 1994    RIGHT  . Mastectomy 1994    Right  . Appendectomy   . Lumbar laminectomy 1991    L5-S1   Family History  Problem Relation Age of Onset  . Cancer Mother     metastatic cancer (pancreas vs lung)  . Hypertension Mother   . Osteoarthritis Mother   . Hypothyroidism Mother   . Heart disease Father     CAD/MI  . Hypertension Father   . Arthritis Father   . Hemochromatosis Father     Possible  . Diabetes Neg Hx   . Hemochromatosis Brother    History   Social History  . Marital Status: Widowed    Spouse Name: N/A    Number of Children: N/A  . Years of Education: N/A   Occupational History  . Not on file.   Social History Main Topics  . Smoking status: Former Smoker    Types: Cigarettes    Quit date: 04/13/1988  . Smokeless tobacco: Not on file   Comment: approximate quit date  . Alcohol Use: Yes     2 OZ, 5 TIMES A WEEK  . Drug Use: Not on file  . Sexually Active: Not on file   Other Topics Concern  . 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. Pt married in 1953 and was widowed in 2002. She now lives alone I-ADLs. Pt has 1 son born 39 and 2 grandchildren.     Current Outpatient Prescriptions on File Prior to Visit  Medication Sig Dispense Refill  . Biotin 5000 MCG CAPS Take by mouth.        . levothyroxine (SYNTHROID, LEVOTHROID) 88 MCG tablet Take 1 tablet (88 mcg total) by mouth daily.  90 tablet  3  . Omega-3 Fatty Acids (FISH OIL PO) Take 1,000 mg by mouth daily.       Marland Kitchen triamterene-hydrochlorothiazide (MAXZIDE-25) 37.5-25 MG per tablet TAKE ONE TABLET BY MOUTH EVERY DAY  90 tablet  0  . fluticasone (FLONASE) 50 MCG/ACT nasal spray Place 1 spray into the nose daily.  16 g  6    Review of Systems System review is negative for any constitutional, cardiac, pulmonary, GI or neuro symptoms or complaints other than as described in the HPI.     Objective:   Physical Exam Filed Vitals:   12/29/11 1040  BP: 160/70  Pulse: 67  Temp:  96.6 F (35.9 C)  Resp: 16   Gen'l- slender white woman in no distress HEENT_ PERRLA Cor- RRR Pulm - normal respirations Neuro - A&O x 3, CN II-XII normal, normal gait.       Assessment & Plan:

## 2012-02-05 ENCOUNTER — Other Ambulatory Visit (INDEPENDENT_AMBULATORY_CARE_PROVIDER_SITE_OTHER): Payer: Medicare Other

## 2012-02-05 DIAGNOSIS — E039 Hypothyroidism, unspecified: Secondary | ICD-10-CM | POA: Diagnosis not present

## 2012-02-05 DIAGNOSIS — I10 Essential (primary) hypertension: Secondary | ICD-10-CM | POA: Diagnosis not present

## 2012-02-05 LAB — COMPREHENSIVE METABOLIC PANEL
ALT: 22 U/L (ref 0–35)
AST: 28 U/L (ref 0–37)
Albumin: 3.6 g/dL (ref 3.5–5.2)
Calcium: 9.1 mg/dL (ref 8.4–10.5)
Chloride: 100 mEq/L (ref 96–112)
Creatinine, Ser: 0.7 mg/dL (ref 0.4–1.2)
Potassium: 4.5 mEq/L (ref 3.5–5.1)
Sodium: 135 mEq/L (ref 135–145)
Total Protein: 7.1 g/dL (ref 6.0–8.3)

## 2012-02-08 ENCOUNTER — Ambulatory Visit (INDEPENDENT_AMBULATORY_CARE_PROVIDER_SITE_OTHER): Payer: Medicare Other | Admitting: Internal Medicine

## 2012-02-08 ENCOUNTER — Encounter: Payer: Self-pay | Admitting: Internal Medicine

## 2012-02-08 VITALS — BP 152/70 | HR 67 | Temp 97.0°F | Resp 16 | Wt 105.0 lb

## 2012-02-08 DIAGNOSIS — M546 Pain in thoracic spine: Secondary | ICD-10-CM | POA: Insufficient documentation

## 2012-02-08 MED ORDER — BIOTIN 5000 MCG PO CAPS: 5000.0000 ug | ORAL_CAPSULE | Freq: Every day | ORAL | Status: DC

## 2012-02-08 MED ORDER — LEVOTHYROXINE SODIUM 88 MCG PO TABS: 88.0000 ug | ORAL_TABLET | Freq: Every day | ORAL | Status: DC

## 2012-02-08 MED ORDER — TRIAMTERENE-HCTZ 37.5-25 MG PO TABS: 1.0000 | ORAL_TABLET | Freq: Every day | ORAL | Status: DC

## 2012-02-08 MED ORDER — DULOXETINE HCL 30 MG PO CPEP: 30.0000 mg | ORAL_CAPSULE | Freq: Every day | ORAL | Status: DC

## 2012-02-08 NOTE — Assessment & Plan Note (Signed)
Long term chronic problem (10 years) but getting worse.  Plan - MRI thoracic spine to r/o DDD, radiculopathy  Trial of cymbalta 30 mg daily for pain management 

## 2012-02-08 NOTE — Progress Notes (Signed)
Subjective:    Patient ID: Brandy Peterson, female    DOB: Aug 20, 1930, 78 y.o.   MRN: [id number]  HPI Brandy Peterson presents for evaluation of intractable mid-thoracic back pain that has been a problem on and off for approximately 10 years. She did have shingles at the right mid-back which was painful but resolved. Several months later she started having intermittent pain in the mid back both right and left. She describes the pain as deep and nerve like. Over the years she has had multiple evaluations. Approximately 2003 she had an MRI of the spine which revealed a minor bulging disk. This was not thought to be a surgical problem. She has been tried on a variety of drugs that really did not help except for lyrica which may have helped some but the side effects were freightening. Over the last 2 weeks the pain has been much worse and more persistent although it has not disturbed her sleep. She has some paresthesia in her hands but no focal weakness.   Past Medical History  Diagnosis Date  . Raynaud's syndrome   . Hemochromatosis   . Thyroid disease   . IBS (irritable bowel syndrome)   . Backache, unspecified   . Hypertension   . Carcinoma of breast     Right  . Plantar fasciitis    Past Surgical History  Procedure Date  . Bone spur 1970    BOTH FEET  . Abdominal hysterectomy 1979    PARTIAL  . Mastectomy 1994    RIGHT  . Mastectomy 1994    Right  . Appendectomy   . Lumbar laminectomy 1991    L5-S1   Family History  Problem Relation Age of Onset  . Cancer Mother     metastatic cancer (pancreas vs lung)  . Hypertension Mother   . Osteoarthritis Mother   . Hypothyroidism Mother   . Heart disease Father     CAD/MI  . Hypertension Father   . Arthritis Father   . Hemochromatosis Father     Possible  . Diabetes Neg Hx   . Hemochromatosis Brother    History   Social History  . Marital Status: Widowed    Spouse Name: N/A    Number of Children: N/A  . Years of Education: N/A    Occupational History  . Not on file.   Social History Main Topics  . Smoking status: Former Smoker    Types: Cigarettes    Quit date: 04/13/1988  . Smokeless tobacco: Not on file   Comment: approximate quit date  . Alcohol Use: Yes     2 OZ, 5 TIMES A WEEK  . Drug Use: Not on file  . Sexually Active: Not on file   Other Topics Concern  . 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 She now lives alone I-ADLs. Pt has 1 son born 67 and 2 grandchildren.     Current Outpatient Prescriptions on File Prior to Visit  Medication Sig Dispense Refill  . Biotin 5000 MCG CAPS Take by mouth.        . fluticasone (FLONASE) 50 MCG/ACT nasal spray Place 1 spray into the nose daily.  16 g  6  . levothyroxine (SYNTHROID, LEVOTHROID) 88 MCG tablet Take 1 tablet (88 mcg total) by mouth daily.  90 tablet  3  . Omega-3 Fatty Acids (FISH OIL PO) Take 1,000 mg  by mouth daily.       Marland Kitchen triamterene-hydrochlorothiazide (MAXZIDE-25) 37.5-25 MG per tablet TAKE ONE TABLET BY MOUTH EVERY DAY  90 tablet  0     Review of Systems System review is negative for any constitutional, cardiac, pulmonary, GI or neuro symptoms or complaints other than as described in the HPI.     Objective:   Physical Exam Filed Vitals:   02/08/12 1718  BP: 152/70  Pulse: 67  Temp: 97 F (36.1 C)  Resp: 16   Gen'l- WNWD white woman who is in no acute distress Cor- RRR Pulm - normal respirations, lungs clear MSK  - no tenderness to percussion along the spine, no palpable tenderness in the back, pain with holding arms outstretched in front of her, with bending to the side. She has normal grip and arm strength and full ROM  Lab Results  Component Value Date   WBC 5.6 11/26/2011   HGB 14.5 11/26/2011   HCT 41.6 11/26/2011   PLT 241 11/26/2011   GLUCOSE 86 02/05/2012   CHOL 206* 12/26/2010   TRIG 54.0 12/26/2010   HDL 85.00  12/26/2010   LDLDIRECT 93.6 12/26/2010   ALT 22 02/05/2012   AST 28 02/05/2012   NA 135 02/05/2012   K 4.5 02/05/2012   CL 100 02/05/2012   CREATININE 0.7 02/05/2012   BUN 17 02/05/2012   CO2 27 02/05/2012   TSH 2.92 02/05/2012          Assessment & Plan:

## 2012-02-08 NOTE — Patient Instructions (Addendum)
Long term chronic problem (10 years) but getting worse.  Plan - MRI thoracic spine to r/o DDD, radiculopathy  Trial of cymbalta 30 mg daily for pain management

## 2012-02-16 ENCOUNTER — Ambulatory Visit (HOSPITAL_COMMUNITY)
Admission: RE | Admit: 2012-02-16 | Discharge: 2012-02-16 | Disposition: A | Discharge status: Home / Self Care | Payer: Medicare Other | Source: Ambulatory Visit | Attending: Internal Medicine | Admitting: Internal Medicine

## 2012-02-16 DIAGNOSIS — G8929 Other chronic pain: Secondary | ICD-10-CM | POA: Insufficient documentation

## 2012-02-16 DIAGNOSIS — M5124 Other intervertebral disc displacement, thoracic region: Secondary | ICD-10-CM | POA: Diagnosis not present

## 2012-02-16 DIAGNOSIS — M546 Pain in thoracic spine: Secondary | ICD-10-CM

## 2012-04-29 ENCOUNTER — Encounter (INDEPENDENT_AMBULATORY_CARE_PROVIDER_SITE_OTHER): Payer: Self-pay | Admitting: General Surgery

## 2012-04-29 ENCOUNTER — Ambulatory Visit (INDEPENDENT_AMBULATORY_CARE_PROVIDER_SITE_OTHER): Payer: Medicare Other | Admitting: General Surgery

## 2012-04-29 VITALS — BP 152/86 | HR 67 | Temp 96.9°F | Ht 62.0 in | Wt 108.0 lb

## 2012-04-29 DIAGNOSIS — C50919 Malignant neoplasm of unspecified site of unspecified female breast: Secondary | ICD-10-CM

## 2012-04-29 NOTE — Progress Notes (Signed)
Chief complaint: Followup breast cancer  History: Patient returns for yearly followup with a history of 1.8 cm node-negative ER/PR positive carcinoma the right breast status post modified mastectomy by Dr. Samuella Cota and subsequent 5 years of tamoxifen. She reports she is doing well. She denies any changes in her left breast or right chest wall changes. She has some chronic thoracic back pain which has been intermittently bothering her but is really no different over the last couple of years. Previous MRI showed no evidence of tumor. Imaging last year at [facility] was negative.  Exam: BP 152/86  Pulse 67  Temp 96.9 F (36.1 C) (Temporal)  Ht 5\' 2"  (1.575 m)  Wt 108 lb (48.988 kg)  BMI 19.75 kg/m2  SpO2 97% General: Appears well HEENT: No palpable mass or thyromegaly. Lymph nodes: No cervical, subclavicular or axillary nodes palpable Breasts: Well-healed mastectomy site right chest wall without skin changes or nodularity. Left breast is negative to exam.  Imaging: mammogram at [facility] August 2013 was negative  Assessment and plan: No evidence of recurrent or new breast cancer on clinical followup and imaging. She was like to return in one year for followup.

## 2012-05-09 DIAGNOSIS — Z1211 Encounter for screening for malignant neoplasm of colon: Secondary | ICD-10-CM | POA: Diagnosis not present

## 2012-05-13 ENCOUNTER — Other Ambulatory Visit (HOSPITAL_BASED_OUTPATIENT_CLINIC_OR_DEPARTMENT_OTHER): Payer: Medicare Other | Admitting: Lab

## 2012-05-13 ENCOUNTER — Ambulatory Visit: Payer: Medicare Other | Admitting: Oncology

## 2012-05-13 LAB — CBC WITH DIFFERENTIAL/PLATELET
Basophils Absolute: 0 10*3/uL (ref 0.0–0.1)
EOS%: 4.7 % (ref 0.0–7.0)
Eosinophils Absolute: 0.3 10*3/uL (ref 0.0–0.5)
HCT: 42.9 % (ref 34.8–46.6)
HGB: 15.2 g/dL (ref 11.6–15.9)
MCH: 33 pg (ref 25.1–34.0)
MCV: 93.4 fL (ref 79.5–101.0)
MONO%: 9.1 % (ref 0.0–14.0)
NEUT#: 4.2 10*3/uL (ref 1.5–6.5)
NEUT%: 67.2 % (ref 38.4–76.8)
RDW: 12.2 % (ref 11.2–14.5)

## 2012-05-13 LAB — COMPREHENSIVE METABOLIC PANEL (CC13)
Albumin: 3.5 g/dL (ref 3.5–5.0)
Alkaline Phosphatase: 84 U/L (ref 40–150)
BUN: 15 mg/dL (ref 7.0–26.0)
CO2: 27 mEq/L (ref 22–29)
Glucose: 88 mg/dl (ref 70–99)
Potassium: 3.9 mEq/L (ref 3.5–5.1)
Sodium: 137 mEq/L (ref 136–145)
Total Bilirubin: 0.67 mg/dL (ref 0.20–1.20)
Total Protein: 7.1 g/dL (ref 6.4–8.3)

## 2012-05-17 ENCOUNTER — Telehealth: Payer: Self-pay

## 2012-05-17 NOTE — Telephone Encounter (Signed)
Told Brandy Peterson the ferritin value as noted below by Dr. Darrold Span. She is fine with phlebotomy on 05-20-12 if it can be coordinated.  If the appointment needs to be changed, she cannot come on mondays or tuesdays. Told her that this office will call her to let her know if appt. 05-20-12 or another day.

## 2012-05-17 NOTE — Telephone Encounter (Signed)
Message copied by Lorine Bears on Tue May 17, 2012  8:40 PM ------      Message from: Reece Packer      Created: Fri May 13, 2012  5:37 PM       Labs seen and need follow up: please let her know ferritin 63, other labs good. Please add phlebotomy to visit on 2-7 if possible (ok to move MD visit to coordinate with phlebotomy if cannot do it that day). She needs to push po fluids day of phlebotomy

## 2012-05-18 ENCOUNTER — Telehealth: Payer: Self-pay | Admitting: *Deleted

## 2012-05-18 NOTE — Telephone Encounter (Signed)
Notified pt of phlebotomy appointment at 11:00 on 05/20/12

## 2012-05-19 ENCOUNTER — Encounter: Payer: Self-pay | Admitting: Internal Medicine

## 2012-05-20 ENCOUNTER — Ambulatory Visit (HOSPITAL_BASED_OUTPATIENT_CLINIC_OR_DEPARTMENT_OTHER): Payer: Medicare Other

## 2012-05-20 ENCOUNTER — Encounter: Payer: Self-pay | Admitting: Oncology

## 2012-05-20 ENCOUNTER — Ambulatory Visit (HOSPITAL_BASED_OUTPATIENT_CLINIC_OR_DEPARTMENT_OTHER): Payer: Medicare Other | Admitting: Oncology

## 2012-05-20 ENCOUNTER — Telehealth: Payer: Self-pay | Admitting: Oncology

## 2012-05-20 DIAGNOSIS — Z853 Personal history of malignant neoplasm of breast: Secondary | ICD-10-CM | POA: Diagnosis not present

## 2012-05-20 NOTE — Telephone Encounter (Signed)
Gave pt for labs and see MD on March 2015 with labs

## 2012-05-20 NOTE — Patient Instructions (Signed)

## 2012-05-20 NOTE — Progress Notes (Signed)
OFFICE PROGRESS NOTE   05/20/2012   Physicians:M.Norins, J.Edwards, B.Hoxworth   INTERVAL HISTORY:   Patient is seen, alone for visit, in continuing follow up of her history of hemachromatosis, for which she has periodic phlebotomies to keep ferritin <=50. Most recent ferritin from 05-13-12 was 63, and she is scheduled for phlebotomy today. She also has remote history of stage 1 right breast cancer She is up to date on visits to Dr Debby Bud, had recent colonoscopy by Dr Randa Evens which will be her last routine colonoscopy due to age, saw Dr Johna Sheriff for breast exam in Jan 2015, last mammograms at [facility] 12-03-11.  Patient continues to have intermittent mid back pain, as she has had for several years. This has occurred once in past 2 months, resolves when she stays very still, no clear precipitating factors. She did have MRI of Tspine Nov 2013 which was normal with exception of small central disc protrusion at T11-12.  Patient has otherwise felt generally well. She has had no recent infectious illness, energy at baseline, no new or different pain, no abdominal symptoms, no shortness of breath or cough. Remainder of 10 point Review of Systems negative.  Objective:  Vital signs in last 24 hours:  BP 163/75  Pulse 69  Temp 96.8 F (36 C) (Oral)  Resp 20  Ht 5\' 2"  (1.575 m)  Wt 110 lb 8 oz (50.122 kg)  BMI 20.21 kg/m2 Weight is up 1 lb from a year ago. Ambulatory without difficulty, appears comfortable.   HEENT:PERRLA, sclera clear, anicteric and oropharynx clear, no lesions LymphaticsCervical, supraclavicular, and axillary nodes normal. Resp: clear to auscultation bilaterally and normal percussion bilaterally. Back nontender to palpation Cardio: regular rate and rhythm GI: soft, non-tender; bowel sounds normal; no masses,  no organomegaly Extremities: extremities normal, atraumatic, no cyanosis or edema Neuro:nonfocal Breast:patient declined breast exam today Skin without rash or  ecchymosis  Lab Results:  Results for orders placed in visit on 05/13/12  CBC WITH DIFFERENTIAL      Component Value Range   WBC 6.3  3.9 - 10.3 10e3/uL   NEUT# 4.2  1.5 - 6.5 10e3/uL   HGB 15.2  11.6 - 15.9 g/dL   HCT 40.9  81.1 - 91.4 %   Platelets 227  145 - 400 10e3/uL   MCV 93.4  79.5 - 101.0 fL   MCH 33.0  25.1 - 34.0 pg   MCHC 35.3  31.5 - 36.0 g/dL   RBC 7.82  9.56 - 2.13 10e6/uL   RDW 12.2  11.2 - 14.5 %   lymph# 1.2  0.9 - 3.3 10e3/uL   MONO# 0.6  0.1 - 0.9 10e3/uL   Eosinophils Absolute 0.3  0.0 - 0.5 10e3/uL   Basophils Absolute 0.0  0.0 - 0.1 10e3/uL   NEUT% 67.2  38.4 - 76.8 %   LYMPH% 18.4  14.0 - 49.7 %   MONO% 9.1  0.0 - 14.0 %   EOS% 4.7  0.0 - 7.0 %   BASO% 0.6  0.0 - 2.0 %  FERRITIN      Component Value Range   Ferritin 63  10 - 291 ng/mL  COMPREHENSIVE METABOLIC PANEL (CC13)      Component Value Range   Sodium 137  136 - 145 mEq/L   Potassium 3.9  3.5 - 5.1 mEq/L   Chloride 100  98 - 107 mEq/L   CO2 27  22 - 29 mEq/L   Glucose 88  70 - 99 mg/dl   BUN  15.0  7.0 - 26.0 mg/dL   Creatinine 0.8  0.6 - 1.1 mg/dL   Total Bilirubin 1.47  0.20 - 1.20 mg/dL   Alkaline Phosphatase 84  40 - 150 U/L   AST 21  5 - 34 U/L   ALT 18  0 - 55 U/L   Total Protein 7.1  6.4 - 8.3 g/dL   Albumin 3.5  3.5 - 5.0 g/dL   Calcium 9.1  8.4 - 82.9 mg/dL     Studies/Results:  No results found.  Medications: I have reviewed the patient's current medications.  Assessment/Plan:  1.hemochromatosis: she will have phlebotomy today and we will recheck ferritin in 6-8 weeks to be sure that this was adequate. She had labs just every 6 months last year, but may need these ~ every 4 months depending on next results. I will see her back at least in a year or sooner if needed.  2.T1 (1.8 cm) node negative ER PR positive right breast cancer treated with modified right mastectomy by Dr Ronnette Juniper followed by 5 years tamoxifen.   Patient was in agreement with plan above and knows that  she can call at any time if needed.  Gerard Cantara P, MD   05/20/2012, 10:32 AM

## 2012-05-20 NOTE — Patient Instructions (Signed)
Appointments as scheduled. 

## 2012-06-09 ENCOUNTER — Encounter: Payer: Self-pay | Admitting: Internal Medicine

## 2012-07-08 ENCOUNTER — Other Ambulatory Visit (HOSPITAL_BASED_OUTPATIENT_CLINIC_OR_DEPARTMENT_OTHER): Payer: Medicare Other | Admitting: Lab

## 2012-07-08 LAB — CBC WITH DIFFERENTIAL/PLATELET
BASO%: 0.4 % (ref 0.0–2.0)
HCT: 43.3 % (ref 34.8–46.6)
LYMPH%: 18.2 % (ref 14.0–49.7)
MCH: 32.3 pg (ref 25.1–34.0)
MCHC: 34.4 g/dL (ref 31.5–36.0)
MCV: 93.7 fL (ref 79.5–101.0)
MONO#: 0.6 10*3/uL (ref 0.1–0.9)
MONO%: 9.9 % (ref 0.0–14.0)
NEUT%: 68.1 % (ref 38.4–76.8)
Platelets: 247 10*3/uL (ref 145–400)
RBC: 4.62 10*6/uL (ref 3.70–5.45)
WBC: 6.2 10*3/uL (ref 3.9–10.3)

## 2012-07-11 ENCOUNTER — Telehealth: Payer: Self-pay

## 2012-07-11 NOTE — Telephone Encounter (Signed)
Message copied by Lorine Bears on Mon Jul 11, 2012  7:26 PM ------      Message from: Reece Packer      Created: Sat Jul 09, 2012  9:35 AM       Labs seen and need follow up: please let her know ferritin is in good range now. Next lab apts July and Nov ok ------

## 2012-07-11 NOTE — Telephone Encounter (Signed)
Told Brandy Peterson that her ferritin on 07-08-12 wa down to 33 and in good range as noted below by Dr. Darrold Span.

## 2012-09-23 DIAGNOSIS — H2589 Other age-related cataract: Secondary | ICD-10-CM | POA: Diagnosis not present

## 2012-09-23 DIAGNOSIS — H25819 Combined forms of age-related cataract, unspecified eye: Secondary | ICD-10-CM | POA: Insufficient documentation

## 2012-09-27 ENCOUNTER — Telehealth: Payer: Self-pay | Admitting: Oncology

## 2012-09-27 ENCOUNTER — Other Ambulatory Visit: Payer: Self-pay | Admitting: *Deleted

## 2012-09-27 NOTE — Telephone Encounter (Signed)
s.w. pt and advised on  3.2015 est appt being week after MD visit pt....pt ok and aware

## 2012-11-02 ENCOUNTER — Telehealth: Payer: Self-pay | Admitting: Oncology

## 2012-11-02 NOTE — Telephone Encounter (Signed)
pt called to r/s lab due to funeral..Marland KitchenMarland KitchenDone

## 2012-11-08 ENCOUNTER — Other Ambulatory Visit: Payer: Medicare Other

## 2012-11-09 ENCOUNTER — Other Ambulatory Visit (HOSPITAL_BASED_OUTPATIENT_CLINIC_OR_DEPARTMENT_OTHER): Payer: Medicare Other

## 2012-11-09 LAB — CBC WITH DIFFERENTIAL/PLATELET
BASO%: 0.5 % (ref 0.0–2.0)
Basophils Absolute: 0 10*3/uL (ref 0.0–0.1)
EOS%: 3.8 % (ref 0.0–7.0)
HGB: 15.5 g/dL (ref 11.6–15.9)
MCH: 32.7 pg (ref 25.1–34.0)
MCHC: 35 g/dL (ref 31.5–36.0)
MCV: 93.6 fL (ref 79.5–101.0)
MONO%: 8.7 % (ref 0.0–14.0)
NEUT%: 70 % (ref 38.4–76.8)
RDW: 12.6 % (ref 11.2–14.5)

## 2012-11-11 ENCOUNTER — Telehealth: Payer: Self-pay

## 2012-11-11 NOTE — Telephone Encounter (Signed)
Spoke with Ms. Brandy Peterson and told her that she did not need a phlebotomy as her ferritin was 46.  Phlebotomy is done to keep ferritin level less than 50 per Dr. Precious Reel office note dated 05-20-12.  She is to keep appointment as scheduled Nov. 25, 2014 for ferritin level. Patient verbalized understanding.

## 2012-11-16 ENCOUNTER — Other Ambulatory Visit: Payer: Self-pay

## 2012-11-16 ENCOUNTER — Encounter: Payer: Self-pay | Admitting: Internal Medicine

## 2012-11-21 ENCOUNTER — Encounter: Payer: Self-pay | Admitting: Internal Medicine

## 2012-11-21 DIAGNOSIS — H52213 Irregular astigmatism, bilateral: Secondary | ICD-10-CM | POA: Insufficient documentation

## 2012-11-21 DIAGNOSIS — H2589 Other age-related cataract: Secondary | ICD-10-CM | POA: Diagnosis not present

## 2012-11-21 DIAGNOSIS — H52219 Irregular astigmatism, unspecified eye: Secondary | ICD-10-CM | POA: Diagnosis not present

## 2012-11-24 DIAGNOSIS — H2589 Other age-related cataract: Secondary | ICD-10-CM | POA: Diagnosis not present

## 2012-11-24 DIAGNOSIS — I10 Essential (primary) hypertension: Secondary | ICD-10-CM | POA: Diagnosis not present

## 2012-11-24 DIAGNOSIS — E039 Hypothyroidism, unspecified: Secondary | ICD-10-CM | POA: Diagnosis not present

## 2012-11-24 DIAGNOSIS — F411 Generalized anxiety disorder: Secondary | ICD-10-CM | POA: Diagnosis not present

## 2012-11-25 DIAGNOSIS — Z48810 Encounter for surgical aftercare following surgery on the sense organs: Secondary | ICD-10-CM | POA: Diagnosis not present

## 2012-11-25 DIAGNOSIS — Z9849 Cataract extraction status, unspecified eye: Secondary | ICD-10-CM | POA: Diagnosis not present

## 2012-11-30 DIAGNOSIS — Z961 Presence of intraocular lens: Secondary | ICD-10-CM | POA: Insufficient documentation

## 2012-12-01 DIAGNOSIS — Z1231 Encounter for screening mammogram for malignant neoplasm of breast: Secondary | ICD-10-CM | POA: Diagnosis not present

## 2012-12-15 DIAGNOSIS — H5709 Other anomalies of pupillary function: Secondary | ICD-10-CM | POA: Diagnosis not present

## 2012-12-15 DIAGNOSIS — Z961 Presence of intraocular lens: Secondary | ICD-10-CM | POA: Diagnosis not present

## 2012-12-15 DIAGNOSIS — Z87891 Personal history of nicotine dependence: Secondary | ICD-10-CM | POA: Diagnosis not present

## 2012-12-15 DIAGNOSIS — H2510 Age-related nuclear cataract, unspecified eye: Secondary | ICD-10-CM | POA: Diagnosis not present

## 2012-12-15 DIAGNOSIS — H538 Other visual disturbances: Secondary | ICD-10-CM | POA: Diagnosis not present

## 2012-12-15 DIAGNOSIS — Z48810 Encounter for surgical aftercare following surgery on the sense organs: Secondary | ICD-10-CM | POA: Diagnosis not present

## 2012-12-22 DIAGNOSIS — E039 Hypothyroidism, unspecified: Secondary | ICD-10-CM | POA: Diagnosis not present

## 2012-12-22 DIAGNOSIS — I10 Essential (primary) hypertension: Secondary | ICD-10-CM | POA: Diagnosis not present

## 2012-12-22 DIAGNOSIS — H2589 Other age-related cataract: Secondary | ICD-10-CM | POA: Diagnosis not present

## 2012-12-27 ENCOUNTER — Encounter: Payer: Self-pay | Admitting: Oncology

## 2013-01-02 DIAGNOSIS — Z23 Encounter for immunization: Secondary | ICD-10-CM | POA: Diagnosis not present

## 2013-01-25 DIAGNOSIS — H26499 Other secondary cataract, unspecified eye: Secondary | ICD-10-CM | POA: Insufficient documentation

## 2013-02-04 ENCOUNTER — Other Ambulatory Visit: Payer: Self-pay | Admitting: Internal Medicine

## 2013-02-16 ENCOUNTER — Other Ambulatory Visit: Payer: Self-pay

## 2013-03-07 ENCOUNTER — Other Ambulatory Visit (HOSPITAL_BASED_OUTPATIENT_CLINIC_OR_DEPARTMENT_OTHER): Payer: Medicare Other

## 2013-03-07 LAB — FERRITIN CHCC: Ferritin: 68 ng/ml (ref 9–269)

## 2013-03-07 LAB — CBC WITH DIFFERENTIAL/PLATELET
Eosinophils Absolute: 0.2 10*3/uL (ref 0.0–0.5)
HCT: 43.2 % (ref 34.8–46.6)
LYMPH%: 20.9 % (ref 14.0–49.7)
MONO#: 0.7 10*3/uL (ref 0.1–0.9)
NEUT#: 4.6 10*3/uL (ref 1.5–6.5)
NEUT%: 64.7 % (ref 38.4–76.8)
Platelets: 246 10*3/uL (ref 145–400)
RBC: 4.51 10*6/uL (ref 3.70–5.45)
WBC: 7.1 10*3/uL (ref 3.9–10.3)

## 2013-03-08 ENCOUNTER — Telehealth: Payer: Self-pay

## 2013-03-08 ENCOUNTER — Telehealth: Payer: Self-pay | Admitting: Oncology

## 2013-03-08 NOTE — Telephone Encounter (Signed)
Spoke with Brandy Peterson and told her her ferritin level as noted below by Dr. Darrold Span.  Will send and order for scheduling  To call her and set appointment up in the nex 2 weeks. [name] cannot come on 12-4; 9; 12.  She would like appointment 10 am or later.

## 2013-03-08 NOTE — Telephone Encounter (Signed)
Per email from [facility] moved pt from CP1 to LL per pt request - ok w/LL. Moved appt from 06/23/13 to 06/21/13 w/LL. S/w pt she is aware. Confirmed both appt for lab 06/16/13 and LL 06/21/13.

## 2013-03-08 NOTE — Telephone Encounter (Signed)
Message copied by Lorine Bears on Wed Mar 08, 2013 12:30 PM ------      Message from: Reece Packer      Created: Wed Mar 08, 2013 12:18 PM       Labs seen and need follow up  Please let her know ferritin was 68, so we can do phlebotomy in next few weeks if that suits her. (Next apt with me is set up in March) ------

## 2013-03-10 ENCOUNTER — Telehealth: Payer: Self-pay | Admitting: Oncology

## 2013-03-10 NOTE — Telephone Encounter (Signed)
s.w,. pt and advised on 12.5.14 appt...pt ok and awre

## 2013-03-17 ENCOUNTER — Ambulatory Visit (HOSPITAL_BASED_OUTPATIENT_CLINIC_OR_DEPARTMENT_OTHER): Payer: Medicare Other

## 2013-03-17 NOTE — Patient Instructions (Signed)

## 2013-03-18 ENCOUNTER — Encounter: Payer: Self-pay | Admitting: Internal Medicine

## 2013-03-24 DIAGNOSIS — Z961 Presence of intraocular lens: Secondary | ICD-10-CM | POA: Diagnosis not present

## 2013-04-21 ENCOUNTER — Ambulatory Visit (INDEPENDENT_AMBULATORY_CARE_PROVIDER_SITE_OTHER): Payer: Medicare Other | Admitting: General Surgery

## 2013-04-21 ENCOUNTER — Encounter (INDEPENDENT_AMBULATORY_CARE_PROVIDER_SITE_OTHER): Payer: Self-pay | Admitting: General Surgery

## 2013-04-21 VITALS — BP 124/76 | HR 66 | Temp 97.6°F | Resp 16 | Ht 62.0 in | Wt 105.2 lb

## 2013-04-21 DIAGNOSIS — C50919 Malignant neoplasm of unspecified site of unspecified female breast: Secondary | ICD-10-CM

## 2013-04-21 NOTE — Progress Notes (Signed)
Chief complaint: Followup breast cancer  History: Patient returns for yearly followup with a history of 1.8 cm node-negative ER/PR positive carcinoma the right breast status post modified mastectomy by Dr. March Rummage and subsequent 5 years of tamoxifen. She reports she is doing well. She denies any changes in her left breast or right chest wall changes. She has had some problems with fragile skin and superficial vein rupture is but nothing related to her breast or chest wall.  Exam: BP 124/76  Pulse 66  Temp(Src) 97.6 F (36.4 C) (Temporal)  Resp 16  Ht 5\' 2"  (1.575 m)  Wt 105 lb 3.2 oz (47.718 kg)  BMI 19.24 kg/m2 General: Appears well HEENT: No palpable mass or thyromegaly. Lymph nodes: No cervical, subclavicular or axillary nodes palpable Breasts: Well-healed mastectomy site right chest wall without skin changes or nodularity. Left breast is negative to exam.  Imaging: mammogram at [facility] August 2014 was negative  Assessment and plan: No evidence of recurrent or new breast cancer on clinical followup and imaging. She was like to return in one year for followup.

## 2013-05-02 ENCOUNTER — Encounter: Payer: Self-pay | Admitting: Internal Medicine

## 2013-05-02 ENCOUNTER — Other Ambulatory Visit (INDEPENDENT_AMBULATORY_CARE_PROVIDER_SITE_OTHER): Payer: Medicare Other

## 2013-05-02 ENCOUNTER — Ambulatory Visit (INDEPENDENT_AMBULATORY_CARE_PROVIDER_SITE_OTHER): Payer: Medicare Other | Admitting: Internal Medicine

## 2013-05-02 VITALS — BP 140/88 | HR 60 | Temp 97.7°F | Wt 107.0 lb

## 2013-05-02 DIAGNOSIS — E039 Hypothyroidism, unspecified: Secondary | ICD-10-CM

## 2013-05-02 DIAGNOSIS — Z0000 Encounter for general adult medical examination without abnormal findings: Secondary | ICD-10-CM

## 2013-05-02 DIAGNOSIS — I10 Essential (primary) hypertension: Secondary | ICD-10-CM | POA: Diagnosis not present

## 2013-05-02 DIAGNOSIS — Z23 Encounter for immunization: Secondary | ICD-10-CM | POA: Diagnosis not present

## 2013-05-02 LAB — BASIC METABOLIC PANEL
BUN: 18 mg/dL (ref 6–23)
CALCIUM: 9.3 mg/dL (ref 8.4–10.5)
CHLORIDE: 100 meq/L (ref 96–112)
CO2: 28 meq/L (ref 19–32)
CREATININE: 0.8 mg/dL (ref 0.4–1.2)
GFR: 77.76 mL/min (ref >60.00)
Glucose, Bld: 88 mg/dL (ref 70–99)
Potassium: 4.7 mEq/L (ref 3.5–5.1)
Sodium: 137 mEq/L (ref 135–145)

## 2013-05-02 LAB — T4, FREE: Free T4: 1.02 ng/dL (ref 0.60–1.60)

## 2013-05-02 LAB — TSH: TSH: 3.72 u[IU]/mL (ref 0.35–5.50)

## 2013-05-02 NOTE — Assessment & Plan Note (Signed)
Asymptomatic.  Plan TSH, FT4 with recommendations to follow

## 2013-05-02 NOTE — Assessment & Plan Note (Signed)
Interval history notable for increasing varicose veins - spider veins and some new "aging" spots. Limited Physical exam is normal. She is current with colorectal and breast cancer screening. Immunizations are up to todate.  In summary  A nice woman who appears to be medically stable.

## 2013-05-02 NOTE — Addendum Note (Signed)
Addended by: Roma Schanz R on: 05/02/2013 02:24 PM   Modules accepted: Orders

## 2013-05-02 NOTE — Patient Instructions (Signed)
1. Thyroid - routine labs today and Rx will be refilled after results are back  2. Blood pressure -  BP Readings from Last 3 Encounters:  05/02/13 140/88  04/21/13 124/76  03/17/13 149/48   Adequate control on present meds  3. Hemochromatosis - per Dr. Marko Plume.  4. Health maintenance - current. Will give Prevnar vaccine today.

## 2013-05-02 NOTE — Progress Notes (Signed)
Subjective:    Patient ID: Brandy Peterson, female    DOB: Dec 02, 1930, 80 y.o.   MRN: [id number]  HPI Brandy Peterson presents for follow up of thyroid disease. She has been having increased hair loss. She is also concerned about increasing spider veins. She is reassured that these changes do not put her at cardiac risk or for PE.  She has otherwise been doing well.  Past Medical History  Diagnosis Date  . Raynaud's syndrome   . Hemochromatosis   . Thyroid disease   . IBS (irritable bowel syndrome)   . Backache, unspecified   . Hypertension   . Carcinoma of breast     Right  . Plantar fasciitis    Past Surgical History  Procedure Laterality Date  . Bone spur  1970    BOTH FEET  . Abdominal hysterectomy  1979    PARTIAL  . Mastectomy  1994    RIGHT  . Mastectomy  1994    Right  . Appendectomy    . Lumbar laminectomy  1991    L5-S1  . Cataract extraction  10/2012 and 12/2012    bilateral    Family History  Problem Relation Age of Onset  . Cancer Mother     metastatic cancer (pancreas vs lung)  . Hypertension Mother   . Osteoarthritis Mother   . Hypothyroidism Mother   . Heart disease Father     CAD/MI  . Hypertension Father   . Arthritis Father   . Hemochromatosis Father     Possible  . Diabetes Neg Hx   . Hemochromatosis Brother    History   Social History  . Marital Status: Widowed    Spouse Name: N/A    Number of Children: N/A  . Years of Education: N/A   Occupational History  . Not on file.   Social History Main Topics  . Smoking status: Former Smoker    Types: Cigarettes    Quit date: 04/13/1988  . Smokeless tobacco: Not on file     Comment: approximate quit date  . Alcohol Use: Yes     Comment: 2 OZ, 5 TIMES A WEEK  . Drug Use: Not on file  . Sexual Activity: Not on file   Other Topics Concern  . 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 She  now lives alone I-ADLs. Pt has 1 son born 30 and 2 grandchildren.       Pt had a colonoscopy on 05/09/2012 by Dr Oletta Lamas.       Pt reports having a pneumo vaccine.    Current Outpatient Prescriptions on File Prior to Visit  Medication Sig Dispense Refill  . Biotin 5000 MCG CAPS Take 1 capsule (5,000 mcg total) by mouth daily.  30 capsule    . fluticasone (FLONASE) 50 MCG/ACT nasal spray USE ONE SPRAY IN EACH NOSTRIL EVERY DAY  16 g  3  . levothyroxine (SYNTHROID, LEVOTHROID) 88 MCG tablet TAKE ONE TABLET BY MOUTH EVERY DAY  90 tablet  0  . Omega-3 Fatty Acids (FISH OIL PO) Take 1,000 mg by mouth daily.       Marland Kitchen triamterene-hydrochlorothiazide (MAXZIDE-25) 37.5-25 MG per tablet TAKE ONE TABLET BY MOUTH EVERY DAY  90 tablet  0   No current facility-administered medications on file prior to visit.      Review of Systems Constitutional:  Negative for fever,  chills, activity change and unexpected weight change.  HEENT:  Negative for hearing loss, ear pain, congestion, neck stiffness and postnasal drip. Negative for sore throat or swallowing problems. Negative for dental complaints.   Eyes: Negative for vision loss or change in visual acuity.  Respiratory: Negative for chest tightness and wheezing. Negative for DOE.   Cardiovascular: Negative for chest pain or palpitations. No decreased exercise tolerance Gastrointestinal: No change in bowel habit. No bloating or gas. No reflux or indigestion Genitourinary: Negative for urgency, frequency, flank pain and difficulty urinating.  Musculoskeletal: Negative for myalgias, back pain, arthralgias and gait problem.  Neurological: Negative for dizziness, tremors, weakness and headaches.  Hematological: Negative for adenopathy.  Psychiatric/Behavioral: Negative for behavioral problems and dysphoric mood.       Objective:   Physical Exam Filed Vitals:   05/02/13 0941  BP: 140/88  Pulse: 60  Temp: 97.7 F (36.5 C)   Wt Readings from Last 3  Encounters:  05/02/13 107 lb (48.535 kg)  04/21/13 105 lb 3.2 oz (47.718 kg)  05/20/12 110 lb 8 oz (50.122 kg)   Gen'l - slender older woman in no distress HEENT- C&S clear Neck - supple, no thyromegaly Nodes - negative cervical region Cor 2+ radial and DP pulse, RRR, no JVD, no carotid bruits PUlm - CTAP Breast - deferred Abd - BS+ , soft, no HSM, no guarding or rebound Neuro - A&O x 3,  Derm - several bruises hands, hands are cold. Spider veins both legs w/o large ropey varicosities.       Assessment & Plan:

## 2013-05-02 NOTE — Assessment & Plan Note (Signed)
She is current with Dr. Marko Plume and doing well.

## 2013-05-02 NOTE — Assessment & Plan Note (Signed)
BP Readings from Last 3 Encounters:  05/02/13 140/88  04/21/13 124/76  03/17/13 149/48   Adequate control on present medication.

## 2013-05-02 NOTE — Progress Notes (Signed)
Pre visit review using our clinic review tool, if applicable. No additional management support is needed unless otherwise documented below in the visit note. 

## 2013-05-04 ENCOUNTER — Telehealth: Payer: Self-pay

## 2013-05-04 NOTE — Telephone Encounter (Signed)
Patient called [facility] stating that was given a written script for zostavax and told to fill in a few weeks. Pt states that PA is required by [facility]. Thanks

## 2013-05-05 NOTE — Telephone Encounter (Addendum)
I called patient to verify if Zostavax needs a prior authorization. She states it infact does. She spoke to a representative last night and they will be faxing the required form. I advised we will wait on that.   Her ID# is [id number] [facility] Rx plan through [place]

## 2013-05-09 ENCOUNTER — Telehealth: Payer: Self-pay | Admitting: Internal Medicine

## 2013-05-09 MED ORDER — LEVOTHYROXINE SODIUM 88 MCG PO TABS: 88.0000 ug | ORAL_TABLET | Freq: Every day | ORAL | Status: DC

## 2013-05-09 MED ORDER — FLUTICASONE PROPIONATE 50 MCG/ACT NA SUSP: 1.0000 | Freq: Every day | NASAL | Status: DC

## 2013-05-09 MED ORDER — TRIAMTERENE-HCTZ 37.5-25 MG PO TABS: 1.0000 | ORAL_TABLET | Freq: Every day | ORAL | Status: DC

## 2013-05-09 NOTE — Telephone Encounter (Signed)
Prescriptions have been sent to [facility].

## 2013-05-09 NOTE — Telephone Encounter (Signed)
Patient is called and left a VM on the triage line to request refills on 3 medications. She says that she was told medications would be refilled after lab results had came back. She needs refills on her fluticasone (FLONASE) nasal spray, thyroid medication and her blood pressure medication. Please advise.

## 2013-05-11 ENCOUNTER — Telehealth: Payer: Self-pay

## 2013-05-11 NOTE — Telephone Encounter (Signed)
I spoke to representative at [facility] and states Zostavax will not be covered under her insurance. Its a benefit exclusion.

## 2013-05-29 ENCOUNTER — Other Ambulatory Visit: Payer: Self-pay | Admitting: Oncology

## 2013-05-31 ENCOUNTER — Telehealth: Payer: Self-pay | Admitting: Oncology

## 2013-05-31 NOTE — Telephone Encounter (Signed)
, °

## 2013-06-16 ENCOUNTER — Ambulatory Visit: Payer: Medicare Other | Admitting: Oncology

## 2013-06-16 ENCOUNTER — Other Ambulatory Visit: Payer: Medicare Other

## 2013-06-21 ENCOUNTER — Ambulatory Visit: Payer: Medicare Other | Admitting: Oncology

## 2013-06-23 ENCOUNTER — Ambulatory Visit: Payer: Medicare Other

## 2013-06-30 ENCOUNTER — Other Ambulatory Visit (HOSPITAL_BASED_OUTPATIENT_CLINIC_OR_DEPARTMENT_OTHER): Payer: Medicare Other

## 2013-06-30 LAB — CBC WITH DIFFERENTIAL/PLATELET
BASO%: 0.5 % (ref 0.0–2.0)
BASOS ABS: 0 10*3/uL (ref 0.0–0.1)
EOS%: 3.1 % (ref 0.0–7.0)
Eosinophils Absolute: 0.2 10*3/uL (ref 0.0–0.5)
HCT: 44.5 % (ref 34.8–46.6)
HEMOGLOBIN: 15.1 g/dL (ref 11.6–15.9)
LYMPH#: 1.2 10*3/uL (ref 0.9–3.3)
LYMPH%: 16.2 % (ref 14.0–49.7)
MCH: 32 pg (ref 25.1–34.0)
MCHC: 34 g/dL (ref 31.5–36.0)
MCV: 94 fL (ref 79.5–101.0)
MONO#: 0.7 10*3/uL (ref 0.1–0.9)
MONO%: 9.3 % (ref 0.0–14.0)
NEUT#: 5.1 10*3/uL (ref 1.5–6.5)
NEUT%: 70.9 % (ref 38.4–76.8)
Platelets: 251 10*3/uL (ref 145–400)
RBC: 4.73 10*6/uL (ref 3.70–5.45)
RDW: 12.4 % (ref 11.2–14.5)
WBC: 7.2 10*3/uL (ref 3.9–10.3)

## 2013-06-30 LAB — FERRITIN CHCC: FERRITIN: 62 ng/mL (ref 9–269)

## 2013-07-03 ENCOUNTER — Telehealth: Payer: Self-pay

## 2013-07-03 NOTE — Telephone Encounter (Signed)
Message copied by Baruch Merl on Mon Jul 03, 2013 11:39 AM ------      Message from: Gordy Levan      Created: Sat Jul 01, 2013 10:41 AM       Labs seen and need follow up: please let Brandy Peterson know ferritin 62. OK to add phlebotomy to Brandy Peterson visit with me 3-27 if she agrees. She should push fluids prior to visit if phlebotomy that day.  thanks ------

## 2013-07-03 NOTE — Telephone Encounter (Signed)
Spoke with Ms. Brandy Peterson and told her her ferritin level and phlebotomy appointment as noted below by Dr. Marko Plume.  Ms. Brandy Peterson is fine with phlebotomy after her appointment on 07-07-13.  Pt will push fluids as directed below.

## 2013-07-04 ENCOUNTER — Other Ambulatory Visit: Payer: Self-pay | Admitting: Oncology

## 2013-07-07 ENCOUNTER — Ambulatory Visit (HOSPITAL_BASED_OUTPATIENT_CLINIC_OR_DEPARTMENT_OTHER): Payer: Medicare Other | Admitting: Oncology

## 2013-07-07 ENCOUNTER — Telehealth: Payer: Self-pay | Admitting: Oncology

## 2013-07-07 ENCOUNTER — Encounter: Payer: Self-pay | Admitting: Oncology

## 2013-07-07 ENCOUNTER — Ambulatory Visit (HOSPITAL_BASED_OUTPATIENT_CLINIC_OR_DEPARTMENT_OTHER): Payer: Medicare Other

## 2013-07-07 DIAGNOSIS — C50919 Malignant neoplasm of unspecified site of unspecified female breast: Secondary | ICD-10-CM

## 2013-07-07 DIAGNOSIS — Z853 Personal history of malignant neoplasm of breast: Secondary | ICD-10-CM | POA: Diagnosis not present

## 2013-07-07 DIAGNOSIS — Z1231 Encounter for screening mammogram for malignant neoplasm of breast: Secondary | ICD-10-CM

## 2013-07-07 NOTE — Progress Notes (Signed)
Tolerated phlebotomy without any adverse event. Blood drained easily with total of 514 grams removed.

## 2013-07-07 NOTE — Patient Instructions (Signed)
Therapeutic Phlebotomy Care After Refer to this sheet in the next few weeks. These instructions provide you with information on caring for yourself after your procedure. Your caregiver may also give you more specific instructions. Your treatment has been planned according to current medical practices, but problems sometimes occur. Call your caregiver if you have any problems or questions after your procedure. HOME CARE INSTRUCTIONS Most people can go back to their normal activities right away. Before you leave, be sure to ask if there is anything you should or should not do. In general, it would be wise to:  Keep the bandage dry. You can remove the bandage after about 5 hours.  Eat well-balanced meals for the next 24 hours.  Drink enough fluids to keep your urine clear or pale yellow.  Avoid drinking alcohol minimally until after eating.  Avoid smoking for at least 30 minutes after the procedure.  Avoid strenous physical activity or heavy lifting or pulling for about 5 hours after the procedure.  Athletes should avoid strenous exercise for 12 hours or more.  Change positions slowly for the remainder of the day to prevent lightheadedness or fainting.  If you feel lightheaded, lie down until the feeling subsides.  If you have bleeding from the needle insertion site, elevate your arm and press firmly on the site until the bleeding stops.  If bruising or bleeding appears under the skin, apply ice to the area for 15 to 20 minutes, 3 to 4 times per day. Put the ice in a plastic bag and place a towel between the bag of ice and your skin. Do this while you are awake for the first 24 hours. The ice packs can be stopped before 24 hours if the swelling goes away. If swelling persists after 24 hours, a warm, moist washcloth can be applied to the area for 15 to 20 minutes, 3 to 4 times per day. The warm, moist treatments can be stopped when the swelling goes away.  It is important to continue further  therapeutic phlebotomy as directed by your caregiver. SEEK MEDICAL CARE IF:  There is bleeding or fluid leaking from the needle insertion site.  The needle insertion site becomes swollen, red, or sore.  You feel lightheaded, dizzy or nauseated, and the feeling does not go away.  You notice new bruising at the needle insertion site.  You feel more weak or tired than normal.  You develop a fever. SEEK IMMEDIATE MEDICAL CARE IF:   There is increased bleeding, pain, or swelling from the needle insertion site.  You have severe nausea or vomiting.  You have chest pain.  You have trouble breathing. MAKE SURE YOU:  Understand these instructions.  Will watch your condition.  Will get help right away if you are not doing well or get worse. Document Released: 09/01/2010 Document Revised: 06/22/2011 Document Reviewed: 09/01/2010 [facility] Patient Information 2014 [place], [place].  It has been a pleasure to serve you today !

## 2013-07-07 NOTE — Progress Notes (Signed)
OFFICE PROGRESS NOTE   07/07/2013   Physicians: (M.Norins)/ J.John, J.Edwards, B.Hoxworth  INTERVAL HISTORY:  Patient is seen, alone for visit, in scheduled follow up of hemochromatosis, this treated with prn phlebotomies to keep ferritin <50. Last phlebotomy was Nov 2014, tho amount of that phlebotomy was less than has been done previously; last prior to Nov 2014 was Feb 2014. Labs drawn 06-30-13 for today's visit had ferritin 62 with WBC 7.2, ANC 5.1, Hgb 15.1 and plt 251.  Patient has been generally well recently. She had cataract surgery summer 2015; has noticed more nontender, not puritic erythematous skin areas on extremities during past year, gets skin tears with SQ bleeding easily tho no other bleeding and this is not really bruising. She occasionally has abdominal bloating, had colonoscopy by Dr Oletta Lamas ~ 2 years ago and is not to have further scheduled colonoscopies due to age. She occasionally feels weak and recdently felt chest tightness when she spread fertilizer on lawn quickly, but mostly goes about regular activities.  She is not aware of any changes in left breast or right mastectomy. Most recent left mammogram was at [facility] 12-01-2012, still with extremely dense tissue in left breast. I have recommended 3D/ tomo mammogram with next imaging in Aug 2015.  She has pushed fluids prior to planned phlebotomy today.  ONCOLOGIC HISTORY Right T1 (1.8 cm)N0 breast cancer 1994, treated with mastectomy with node evaluation by Dr Aaron Edelman and 5 years tamoxifen, not known recurrent.   Review of systems as above, also: No infectious illness thru winter. No pain. No respiratory symptoms. No LE swelling. No epistaxis or other bleeding. Lost weight around cataract surgery, has gained some back and is not stable. Raynaud's symptoms in hands stable. Remainder of 10 point Review of Systems negative.    Objective:  Vital signs in last 24 hours:  BP 174/80  Pulse 64  Temp(Src) 96 F (35.6  C) (Oral)  Resp 18  Ht 5\' 2"  (1.575 m)  Wt 106 lb 4.8 oz (48.217 kg)  BMI 19.44 kg/m2 weight down 4 lbs from a year ago  Alert, oriented and appropriate. Ambulatory without difficulty. Respirations not labored, looks comfortable.   HEENT:PERRL, sclerae not icteric. Oral mucosa moist without lesions, posterior pharynx clear.  Neck supple. No JVD.  Lymphatics:no cervical,suraclavicular, axillary adenopathy Resp: clear to auscultation bilaterally and normal percussion bilaterally Cardio: regular rate and rhythm. No gallop. GI: soft, nontender, not distended, no mass or organomegaly. Normally active bowel sounds. Musculoskeletal/ Extremities: without pitting edema, cords, tenderness Neuro: nonfocal. PSYCH normal mood and affect Skin without  ecchymosis, petechiae. No SQ bleeding. Some patchy discoloration lower legs. A few 3-4 mm nonpalpable areas slight erythema on forearms.  Breasts:Patient preferred not to undress entirely, however right mastectomy scar not remarkable and left breast without dominant mass, skin or nipple findings with exam possible. Axillae benign.   Lab Results:  Results for orders placed in visit on 06/30/13  CBC WITH DIFFERENTIAL      Result Value Ref Range   WBC 7.2  3.9 - 10.3 10e3/uL   NEUT# 5.1  1.5 - 6.5 10e3/uL   HGB 15.1  11.6 - 15.9 g/dL   HCT 44.5  34.8 - 46.6 %   Platelets 251  145 - 400 10e3/uL   MCV 94.0  79.5 - 101.0 fL   MCH 32.0  25.1 - 34.0 pg   MCHC 34.0  31.5 - 36.0 g/dL   RBC 4.73  3.70 - 5.45 10e6/uL   RDW 12.4  11.2 - 14.5 %   lymph# 1.2  0.9 - 3.3 10e3/uL   MONO# 0.7  0.1 - 0.9 10e3/uL   Eosinophils Absolute 0.2  0.0 - 0.5 10e3/uL   Basophils Absolute 0.0  0.0 - 0.1 10e3/uL   NEUT% 70.9  38.4 - 76.8 %   LYMPH% 16.2  14.0 - 49.7 %   MONO% 9.3  0.0 - 14.0 %   EOS% 3.1  0.0 - 7.0 %   BASO% 0.5  0.0 - 2.0 %  FERRITIN [facility]      Result Value Ref Range   Ferritin 62  9 - 269 ng/ml     Studies/Results:  Report of left mammogram  [facility] 12-01-12 reviewed. Imaging of liver has been done by Dr Oletta Lamas  Medications: I have reviewed the patient's current medications.  DISCUSSION: Patient is in agreement with phlebotomy today. She is not comfortable waiting 6 mo for next labs for the hemochromatosis, so will do these at 4 mo, 8 mo and with my visit again in 1 year.   Assessment/Plan: 1.hemachromatosis: phlebotomize to keep hematocrit < ~ 50, schedule as above 2.T1N0 node negative right breast cancer 1994, post mastectomy with nodes, 5 years of adjuvant tamoxifen, and not known recurrent. Recommended 3D tomo mammography with next imaging 11-2013. 3.skin concerns: has seen Dr Nena Polio previousla and expects to reschedule to his office. 4.post cataract extractions    LIVESAY,LENNIS P, MD   07/07/2013, 10:58 AM

## 2013-07-07 NOTE — Telephone Encounter (Signed)
, °

## 2013-07-31 DIAGNOSIS — L570 Actinic keratosis: Secondary | ICD-10-CM | POA: Diagnosis not present

## 2013-07-31 DIAGNOSIS — D485 Neoplasm of uncertain behavior of skin: Secondary | ICD-10-CM | POA: Diagnosis not present

## 2013-07-31 DIAGNOSIS — D235 Other benign neoplasm of skin of trunk: Secondary | ICD-10-CM | POA: Diagnosis not present

## 2013-09-11 DIAGNOSIS — D485 Neoplasm of uncertain behavior of skin: Secondary | ICD-10-CM | POA: Diagnosis not present

## 2013-09-11 DIAGNOSIS — L570 Actinic keratosis: Secondary | ICD-10-CM | POA: Diagnosis not present

## 2013-09-13 ENCOUNTER — Telehealth: Payer: Self-pay | Admitting: Oncology

## 2013-09-13 NOTE — Telephone Encounter (Signed)
pt called to r/s appt..done...pt aware of new d.t °

## 2013-10-17 ENCOUNTER — Other Ambulatory Visit: Payer: Self-pay | Admitting: Dermatology

## 2013-10-17 DIAGNOSIS — D485 Neoplasm of uncertain behavior of skin: Secondary | ICD-10-CM | POA: Diagnosis not present

## 2013-10-17 DIAGNOSIS — L570 Actinic keratosis: Secondary | ICD-10-CM | POA: Diagnosis not present

## 2013-10-17 DIAGNOSIS — C44320 Squamous cell carcinoma of skin of unspecified parts of face: Secondary | ICD-10-CM | POA: Diagnosis not present

## 2013-10-17 DIAGNOSIS — L821 Other seborrheic keratosis: Secondary | ICD-10-CM | POA: Diagnosis not present

## 2013-11-03 ENCOUNTER — Encounter: Payer: Self-pay | Admitting: Internal Medicine

## 2013-11-03 ENCOUNTER — Ambulatory Visit (INDEPENDENT_AMBULATORY_CARE_PROVIDER_SITE_OTHER): Payer: Medicare Other | Admitting: Internal Medicine

## 2013-11-03 VITALS — BP 150/66 | HR 67 | Temp 98.0°F | Wt 105.8 lb

## 2013-11-03 DIAGNOSIS — K117 Disturbances of salivary secretion: Secondary | ICD-10-CM | POA: Diagnosis not present

## 2013-11-03 DIAGNOSIS — R682 Dry mouth, unspecified: Principal | ICD-10-CM

## 2013-11-03 DIAGNOSIS — R202 Paresthesia of skin: Secondary | ICD-10-CM

## 2013-11-03 DIAGNOSIS — R209 Unspecified disturbances of skin sensation: Secondary | ICD-10-CM | POA: Diagnosis not present
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
## 2013-11-03 NOTE — Progress Notes (Signed)
Pre visit review using our clinic review tool, if applicable. No additional management support is needed unless otherwise documented below in the visit note. 

## 2013-11-03 NOTE — Progress Notes (Signed)
   Subjective:    Patient ID: Brandy Peterson, female    DOB: 08/20/1930, 80 y.o.   MRN: [id number]  HPI Pt complains of xerostomia x 5 days. The pt states "my tongue feels like sandpaper". She reports swelling and redness of her tongue. She denies dysphagia or SOB. She has noticed a "white coating" on her tongue, mostly at night, that she has scraped off. She has not been on antibiotics recently. Her last WBC was normal in 3/15.   She also reports lip burning. She has no history of fever blisters.The patient recently had a SCC removed from her nose. She has noticed a spot at the base of her lower lip she worries is cancerous and wonders if that may be causing the lip burning.   She has not taken any new medications nor did she eat any new foods prior to these symptoms. After the patient eats her symptoms improve mildly. She has tried using biotene mouthwash with no relief. She also trying to drink more water.  Review of Systems  Constitutional: Negative for fever and chills.  HENT: Negative for congestion, mouth sores, rhinorrhea, sinus pressure and sore throat.   Respiratory: Negative for cough and shortness of breath.   Gastrointestinal:       No dysphagia       Objective:   Physical Exam Normal PE findings   No upper airway obstruction with hyperventilation       Assessment & Plan:  #1 xerostomia;

## 2013-11-05 NOTE — Patient Instructions (Signed)
Report any changes or progression in your symptoms. Use gargle as prescribed.

## 2013-11-06 ENCOUNTER — Other Ambulatory Visit: Payer: Medicare Other

## 2013-11-08 DIAGNOSIS — H2640 Unspecified secondary cataract: Secondary | ICD-10-CM | POA: Diagnosis not present

## 2013-11-08 DIAGNOSIS — Z961 Presence of intraocular lens: Secondary | ICD-10-CM | POA: Diagnosis not present

## 2013-11-09 ENCOUNTER — Telehealth: Payer: Self-pay

## 2013-11-09 ENCOUNTER — Other Ambulatory Visit (HOSPITAL_BASED_OUTPATIENT_CLINIC_OR_DEPARTMENT_OTHER): Payer: Medicare Other

## 2013-11-09 ENCOUNTER — Telehealth: Payer: Self-pay | Admitting: *Deleted

## 2013-11-09 LAB — FERRITIN CHCC: FERRITIN: 67 ng/mL (ref 9–269)

## 2013-11-09 LAB — CBC WITH DIFFERENTIAL/PLATELET
BASO%: 0.5 % (ref 0.0–2.0)
Basophils Absolute: 0 10*3/uL (ref 0.0–0.1)
EOS%: 3.2 % (ref 0.0–7.0)
Eosinophils Absolute: 0.2 10*3/uL (ref 0.0–0.5)
HCT: 41 % (ref 34.8–46.6)
HGB: 14.3 g/dL (ref 11.6–15.9)
LYMPH%: 21.9 % (ref 14.0–49.7)
MCH: 32 pg (ref 25.1–34.0)
MCHC: 34.9 g/dL (ref 31.5–36.0)
MCV: 91.7 fL (ref 79.5–101.0)
MONO#: 0.6 10*3/uL (ref 0.1–0.9)
MONO%: 10.8 % (ref 0.0–14.0)
NEUT#: 3.8 10*3/uL (ref 1.5–6.5)
NEUT%: 63.6 % (ref 38.4–76.8)
Platelets: 224 10*3/uL (ref 145–400)
RBC: 4.47 10*6/uL (ref 3.70–5.45)
RDW: 12.4 % (ref 11.2–14.5)
WBC: 5.9 10*3/uL (ref 3.9–10.3)
lymph#: 1.3 10*3/uL (ref 0.9–3.3)

## 2013-11-09 NOTE — Telephone Encounter (Signed)
Told Ms. Brandy Peterson that her ferritin level was 67 today.  She will need a phlebotomy as per Dr. Mariana Kaufman note 07-07-13.  Pt. Needs phlebotomy if Ferritin Level > 50.

## 2013-11-09 NOTE — Telephone Encounter (Signed)
Per [facility] staff phone call scheduled appts. Advised schedulers 

## 2013-11-09 NOTE — Telephone Encounter (Signed)
Told Ms. Deoliveira phlebotomy scheduled for 11-14-13 at 1100.

## 2013-11-10 ENCOUNTER — Other Ambulatory Visit: Payer: Self-pay | Admitting: Oncology

## 2013-11-10 ENCOUNTER — Telehealth: Payer: Self-pay | Admitting: Oncology

## 2013-11-10 NOTE — Telephone Encounter (Signed)
s.w. pt and confirmed appts....pt ok adn aware °

## 2013-11-14 ENCOUNTER — Ambulatory Visit (HOSPITAL_BASED_OUTPATIENT_CLINIC_OR_DEPARTMENT_OTHER): Payer: Medicare Other

## 2013-11-14 ENCOUNTER — Ambulatory Visit: Payer: Medicare Other | Admitting: Internal Medicine

## 2013-11-14 NOTE — Patient Instructions (Signed)

## 2013-11-14 NOTE — Progress Notes (Signed)
500 grams of blood removed during therapeutic phlebotomy per order over 5 minutes. Pt tolerated procedure well. Pt is currently eating snacks she provided.   Pt monitored for 30 minutes after phlebotomy. Vital signs stable. Pt ambulatory without difficulty at discharge.

## 2013-11-28 DIAGNOSIS — L819 Disorder of pigmentation, unspecified: Secondary | ICD-10-CM | POA: Diagnosis not present

## 2013-11-28 DIAGNOSIS — D1801 Hemangioma of skin and subcutaneous tissue: Secondary | ICD-10-CM | POA: Diagnosis not present

## 2013-11-28 DIAGNOSIS — L570 Actinic keratosis: Secondary | ICD-10-CM | POA: Diagnosis not present

## 2013-11-28 DIAGNOSIS — D235 Other benign neoplasm of skin of trunk: Secondary | ICD-10-CM | POA: Diagnosis not present

## 2013-12-05 DIAGNOSIS — Z853 Personal history of malignant neoplasm of breast: Secondary | ICD-10-CM | POA: Diagnosis not present

## 2013-12-05 DIAGNOSIS — Z1231 Encounter for screening mammogram for malignant neoplasm of breast: Secondary | ICD-10-CM | POA: Diagnosis not present

## 2013-12-05 LAB — HM MAMMOGRAPHY

## 2014-01-16 ENCOUNTER — Ambulatory Visit (INDEPENDENT_AMBULATORY_CARE_PROVIDER_SITE_OTHER): Payer: Medicare Other

## 2014-01-16 DIAGNOSIS — Z23 Encounter for immunization: Secondary | ICD-10-CM | POA: Diagnosis not present

## 2014-01-26 ENCOUNTER — Encounter: Payer: Self-pay | Admitting: Internal Medicine

## 2014-01-26 ENCOUNTER — Other Ambulatory Visit: Payer: Self-pay

## 2014-02-12 ENCOUNTER — Telehealth: Payer: Self-pay | Admitting: Oncology

## 2014-02-12 NOTE — Telephone Encounter (Signed)
pt cld to r/s appt-gave pt new time & date

## 2014-03-01 DIAGNOSIS — C44321 Squamous cell carcinoma of skin of nose: Secondary | ICD-10-CM | POA: Diagnosis not present

## 2014-03-02 ENCOUNTER — Other Ambulatory Visit (HOSPITAL_BASED_OUTPATIENT_CLINIC_OR_DEPARTMENT_OTHER): Payer: Medicare Other

## 2014-03-02 LAB — CBC WITH DIFFERENTIAL/PLATELET
BASO%: 0.4 % (ref 0.0–2.0)
Basophils Absolute: 0 10*3/uL (ref 0.0–0.1)
EOS ABS: 0.3 10*3/uL (ref 0.0–0.5)
EOS%: 3.7 % (ref 0.0–7.0)
HCT: 42.5 % (ref 34.8–46.6)
HGB: 14.7 g/dL (ref 11.6–15.9)
LYMPH%: 20.1 % (ref 14.0–49.7)
MCH: 32 pg (ref 25.1–34.0)
MCHC: 34.6 g/dL (ref 31.5–36.0)
MCV: 92.6 fL (ref 79.5–101.0)
MONO#: 0.6 10*3/uL (ref 0.1–0.9)
MONO%: 9.2 % (ref 0.0–14.0)
NEUT%: 66.6 % (ref 38.4–76.8)
NEUTROS ABS: 4.5 10*3/uL (ref 1.5–6.5)
Platelets: 231 10*3/uL (ref 145–400)
RBC: 4.59 10*6/uL (ref 3.70–5.45)
RDW: 12.5 % (ref 11.2–14.5)
WBC: 6.7 10*3/uL (ref 3.9–10.3)
lymph#: 1.4 10*3/uL (ref 0.9–3.3)

## 2014-03-02 LAB — FERRITIN CHCC: Ferritin: 49 ng/ml (ref 9–269)

## 2014-03-05 ENCOUNTER — Other Ambulatory Visit: Payer: Medicare Other

## 2014-04-11 ENCOUNTER — Ambulatory Visit (INDEPENDENT_AMBULATORY_CARE_PROVIDER_SITE_OTHER): Payer: Medicare Other | Admitting: Internal Medicine

## 2014-04-11 ENCOUNTER — Encounter: Payer: Self-pay | Admitting: Internal Medicine

## 2014-04-11 VITALS — BP 130/88 | HR 68 | Temp 97.7°F | Ht 61.0 in | Wt 107.1 lb

## 2014-04-11 DIAGNOSIS — E039 Hypothyroidism, unspecified: Secondary | ICD-10-CM | POA: Diagnosis not present

## 2014-04-11 DIAGNOSIS — I10 Essential (primary) hypertension: Secondary | ICD-10-CM

## 2014-04-11 DIAGNOSIS — J309 Allergic rhinitis, unspecified: Secondary | ICD-10-CM | POA: Diagnosis not present

## 2014-04-11 DIAGNOSIS — I8393 Asymptomatic varicose veins of bilateral lower extremities: Secondary | ICD-10-CM | POA: Diagnosis not present

## 2014-04-11 MED ORDER — LEVOTHYROXINE SODIUM 88 MCG PO TABS: 88.0000 ug | ORAL_TABLET | Freq: Every day | ORAL | Status: DC

## 2014-04-11 MED ORDER — TRIAMTERENE-HCTZ 37.5-25 MG PO TABS: 1.0000 | ORAL_TABLET | Freq: Every day | ORAL | Status: DC

## 2014-04-11 NOTE — Progress Notes (Signed)
Subjective:    Patient ID: Brandy Peterson, female    DOB: Nov 15, 1930, 80 y.o.   MRN: [id number]  HPI  Here to f/u, former pt of Dr Linda Hedges now retired,  Pt denies chest pain, increased sob or doe, wheezing, orthopnea, PND, increased LE swelling, palpitations, dizziness or syncope.  Pt denies new neurological symptoms such as new headache, or facial or extremity weakness or numbness   Pt denies polydipsia, polyuria C/o hair thinning - o/w Denies hyper or hypo thyroid symptoms such as voice, skin or hair change.Does have several wks ongoing nasal allergy symptoms with clearish congestion, itch and sneezing, without fever, pain, ST, cough, swelling or wheezing.  Also with several venous varicosities to LE with which she is concerned due to recurring discomfort Past Medical History  Diagnosis Date  . Raynaud's syndrome   . Hemochromatosis   . Thyroid disease   . IBS (irritable bowel syndrome)   . Backache, unspecified   . Hypertension   . Carcinoma of breast     Right  . Plantar fasciitis    Past Surgical History  Procedure Laterality Date  . Bone spur  1970    BOTH FEET  . Abdominal hysterectomy  1979    PARTIAL  . Mastectomy  1994    RIGHT  . Mastectomy  1994    Right  . Appendectomy    . Lumbar laminectomy  1991    L5-S1  . Cataract extraction  10/2012 and 12/2012    bilateral     reports that she quit smoking about 26 years ago. Her smoking use included Cigarettes. She smoked 0.00 packs per day. She does not have any smokeless tobacco history on file. She reports that she drinks alcohol. Her drug history is not on file. family history includes Arthritis in her father; Cancer in her mother; Heart disease in her father; Hemochromatosis in her brother and father; Hypertension in her father and mother; Hypothyroidism in her mother; Osteoarthritis in her mother. There is no history of Diabetes. Allergies  Allergen Reactions  . Contrast Media [Iodinated Diagnostic Agents]   . Sulfonamide  Derivatives    Current Outpatient Prescriptions on File Prior to Visit  Medication Sig Dispense Refill  . Biotin 5000 MCG CAPS Take 1 capsule (5,000 mcg total) by mouth daily. 30 capsule   . fluticasone (FLONASE) 50 MCG/ACT nasal spray Place 1 spray into both nostrils daily. 48 g 3   No current facility-administered medications on file prior to visit.    Review of Systems  Constitutional: Negative for unusual diaphoresis or other sweats  HENT: Negative for ringing in ear Eyes: Negative for double vision or worsening visual disturbance.  Respiratory: Negative for choking and stridor.   Gastrointestinal: Negative for vomiting or other signifcant bowel change Genitourinary: Negative for hematuria or decreased urine volume.  Musculoskeletal: Negative for other MSK pain or swelling Skin: Negative for color change and worsening wound.  Neurological: Negative for tremors and numbness other than noted  Psychiatric/Behavioral: Negative for decreased concentration or agitation other than above       Objective:   Physical Exam BP 130/88 mmHg  Pulse 68  Temp(Src) 97.7 F (36.5 C) (Oral)  Ht 5\' 1"  (1.549 m)  Wt 107 lb 2 oz (48.592 kg)  BMI 20.25 kg/m2  SpO2 97% VS noted,  Constitutional: Pt appears well-developed, well-nourished.  HENT: Head: NCAT.  Right Ear: External ear normal.  Left Ear: External ear normal.  Eyes: . Pupils are equal, round, and  reactive to light. Conjunctivae and EOM are normal Neck: Normal range of motion. Neck supple.  Cardiovascular: Normal rate and regular rhythm.   Pulmonary/Chest: Effort normal and breath sounds without rales or wheezing.  Abd:  Soft, NT, ND, + BS Neurological: Pt is alert. Not confused , motor grossly intact Skin: Skin is warm. No rash; several varicose veins noted, superficial, no phlebitis Psychiatric: Pt behavior is normal. No agitation.        Assessment & Plan:

## 2014-04-11 NOTE — Progress Notes (Signed)
Pre visit review using our clinic review tool, if applicable. No additional management support is needed unless otherwise documented below in the visit note. 

## 2014-04-11 NOTE — Patient Instructions (Signed)
Please continue all other medications as before, and refills have been done if requested.  Please have the pharmacy call with any other refills you may need.  Please continue your efforts at being more active, low cholesterol diet, and weight control.  You are otherwise up to date with prevention measures today.  Please keep your appointments with your specialists as you may have planned  Please go to the LAB in the Basement (turn left off the elevator) for the tests to be done at your convenience  You will be contacted by phone if any changes need to be made immediately.  Otherwise, you will receive a letter about your results with an explanation, but please check with MyChart first.  Please remember to sign up for MyChart if you have not done so, as this will be important to you in the future with finding out test results, communicating by private email, and scheduling acute appointments online when needed.  Please return in 6 months, or sooner if needed 

## 2014-04-12 ENCOUNTER — Encounter (HOSPITAL_COMMUNITY): Payer: Self-pay | Admitting: *Deleted

## 2014-04-12 ENCOUNTER — Emergency Department (HOSPITAL_COMMUNITY): Payer: No Typology Code available for payment source

## 2014-04-12 ENCOUNTER — Telehealth: Payer: Self-pay | Admitting: Internal Medicine

## 2014-04-12 ENCOUNTER — Other Ambulatory Visit (INDEPENDENT_AMBULATORY_CARE_PROVIDER_SITE_OTHER): Payer: Medicare Other

## 2014-04-12 ENCOUNTER — Emergency Department (HOSPITAL_COMMUNITY)
Admission: EM | Admit: 2014-04-12 | Discharge: 2014-04-12 | Disposition: A | Discharge status: Home / Self Care | Payer: No Typology Code available for payment source | Attending: Emergency Medicine | Admitting: Emergency Medicine

## 2014-04-12 DIAGNOSIS — S8992XA Unspecified injury of left lower leg, initial encounter: Secondary | ICD-10-CM | POA: Diagnosis present

## 2014-04-12 DIAGNOSIS — Z8739 Personal history of other diseases of the musculoskeletal system and connective tissue: Secondary | ICD-10-CM | POA: Diagnosis not present

## 2014-04-12 DIAGNOSIS — Z87891 Personal history of nicotine dependence: Secondary | ICD-10-CM | POA: Insufficient documentation

## 2014-04-12 DIAGNOSIS — Y92481 Parking lot as the place of occurrence of the external cause: Secondary | ICD-10-CM | POA: Insufficient documentation

## 2014-04-12 DIAGNOSIS — S82142A Displaced bicondylar fracture of left tibia, initial encounter for closed fracture: Secondary | ICD-10-CM | POA: Insufficient documentation

## 2014-04-12 DIAGNOSIS — Y9389 Activity, other specified: Secondary | ICD-10-CM | POA: Insufficient documentation

## 2014-04-12 DIAGNOSIS — Z853 Personal history of malignant neoplasm of breast: Secondary | ICD-10-CM | POA: Diagnosis not present

## 2014-04-12 DIAGNOSIS — S80211A Abrasion, right knee, initial encounter: Secondary | ICD-10-CM | POA: Diagnosis not present

## 2014-04-12 DIAGNOSIS — Z79899 Other long term (current) drug therapy: Secondary | ICD-10-CM | POA: Insufficient documentation

## 2014-04-12 DIAGNOSIS — I10 Essential (primary) hypertension: Secondary | ICD-10-CM | POA: Diagnosis not present

## 2014-04-12 DIAGNOSIS — M25562 Pain in left knee: Secondary | ICD-10-CM | POA: Diagnosis not present

## 2014-04-12 DIAGNOSIS — Y9289 Other specified places as the place of occurrence of the external cause: Secondary | ICD-10-CM | POA: Insufficient documentation

## 2014-04-12 DIAGNOSIS — Z7951 Long term (current) use of inhaled steroids: Secondary | ICD-10-CM | POA: Diagnosis not present

## 2014-04-12 DIAGNOSIS — E079 Disorder of thyroid, unspecified: Secondary | ICD-10-CM | POA: Diagnosis not present

## 2014-04-12 DIAGNOSIS — Y998 Other external cause status: Secondary | ICD-10-CM | POA: Diagnosis not present

## 2014-04-12 DIAGNOSIS — S51001A Unspecified open wound of right elbow, initial encounter: Secondary | ICD-10-CM | POA: Insufficient documentation

## 2014-04-12 DIAGNOSIS — R52 Pain, unspecified: Secondary | ICD-10-CM | POA: Diagnosis not present

## 2014-04-12 DIAGNOSIS — Z8719 Personal history of other diseases of the digestive system: Secondary | ICD-10-CM | POA: Diagnosis not present

## 2014-04-12 DIAGNOSIS — M25462 Effusion, left knee: Secondary | ICD-10-CM | POA: Diagnosis not present

## 2014-04-12 LAB — HEPATIC FUNCTION PANEL
ALT: 21 U/L (ref 0–35)
AST: 23 U/L (ref 0–37)
Albumin: 4.2 g/dL (ref 3.5–5.2)
Alkaline Phosphatase: 79 U/L (ref 39–117)
BILIRUBIN TOTAL: 1 mg/dL (ref 0.2–1.2)
Bilirubin, Direct: 0.1 mg/dL (ref 0.0–0.3)
Total Protein: 7.1 g/dL (ref 6.0–8.3)

## 2014-04-12 LAB — CBC WITH DIFFERENTIAL/PLATELET
Basophils Absolute: 0 10*3/uL (ref 0.0–0.1)
Basophils Relative: 0.5 % (ref 0.0–3.0)
EOS ABS: 0.3 10*3/uL (ref 0.0–0.7)
Eosinophils Relative: 4.2 % (ref 0.0–5.0)
HEMATOCRIT: 45.9 % (ref 36.0–46.0)
HEMOGLOBIN: 15.5 g/dL — AB (ref 12.0–15.0)
LYMPHS ABS: 1.5 10*3/uL (ref 0.7–4.0)
Lymphocytes Relative: 20.3 % (ref 12.0–46.0)
MCHC: 33.8 g/dL (ref 30.0–36.0)
MCV: 95 fl (ref 78.0–100.0)
Monocytes Absolute: 0.7 10*3/uL (ref 0.1–1.0)
Monocytes Relative: 8.9 % (ref 3.0–12.0)
NEUTROS ABS: 5 10*3/uL (ref 1.4–7.7)
Neutrophils Relative %: 66.1 % (ref 43.0–77.0)
Platelets: 272 10*3/uL (ref 150.0–400.0)
RBC: 4.83 Mil/uL (ref 3.87–5.11)
RDW: 12.5 % (ref 11.5–15.5)
WBC: 7.5 10*3/uL (ref 4.0–10.5)

## 2014-04-12 LAB — BASIC METABOLIC PANEL
BUN: 18 mg/dL (ref 6–23)
CALCIUM: 9.2 mg/dL (ref 8.4–10.5)
CO2: 29 mEq/L (ref 19–32)
Chloride: 101 mEq/L (ref 96–112)
Creatinine, Ser: 0.7 mg/dL (ref 0.4–1.2)
GFR: 85.3 mL/min (ref >60.00)
GLUCOSE: 91 mg/dL (ref 70–99)
POTASSIUM: 4 meq/L (ref 3.5–5.1)
SODIUM: 137 meq/L (ref 135–145)

## 2014-04-12 LAB — URINALYSIS, ROUTINE W REFLEX MICROSCOPIC
BILIRUBIN URINE: NEGATIVE
Hgb urine dipstick: NEGATIVE
Ketones, ur: NEGATIVE
LEUKOCYTES UA: NEGATIVE
NITRITE: NEGATIVE
PH: 7 (ref 5.0–8.0)
SPECIFIC GRAVITY, URINE: 1.01 (ref 1.000–1.030)
Total Protein, Urine: NEGATIVE
Urine Glucose: NEGATIVE
Urobilinogen, UA: 0.2 (ref 0.0–1.0)

## 2014-04-12 LAB — LIPID PANEL
CHOL/HDL RATIO: 3
Cholesterol: 229 mg/dL — ABNORMAL HIGH (ref 0–200)
HDL: 71.5 mg/dL (ref >39.00)
LDL CALC: 138 mg/dL — AB (ref 0–99)
NONHDL: 157.5
Triglycerides: 99 mg/dL (ref 0.0–149.0)
VLDL: 19.8 mg/dL (ref 0.0–40.0)

## 2014-04-12 LAB — T4, FREE: Free T4: 0.97 ng/dL (ref 0.60–1.60)

## 2014-04-12 LAB — TSH: TSH: 1.66 u[IU]/mL (ref 0.35–4.50)

## 2014-04-12 MED ORDER — HYDROCODONE-ACETAMINOPHEN 5-325 MG PO TABS: 1.0000 | ORAL_TABLET | Freq: Four times a day (QID) | ORAL | Status: DC | PRN

## 2014-04-12 NOTE — ED Notes (Signed)
Case management called home health about walker, no answer.

## 2014-04-12 NOTE — Discharge Instructions (Signed)
You have a broken bone in your left knee.  Please keep knee elevated when resting.  Use walker as needed.  Take pain medication as needed.  Follow up with orthopedic specialist next week for further care.  Knee Fracture, Adult A knee fracture is a break in any of the bones of the lower part of the thigh bone, the upper part of the bones of the lower leg, or of the kneecap. When the bones no longer meet the way they are supposed to it is called a dislocation. Sometimes there can be a dislocation along with fractures. SYMPTOMS  Symptoms may include pain, swelling, inability to bend the knee, deformity of the knee, and inability to walk.  DIAGNOSIS  This problem is usually diagnosed with x-rays. Special studies are sometimes done if a fracture is suspected but cannot be seen on ordinary x-rays. If vessels around the knee are injured, special tests may be done to see what the damage is. TREATMENT   The leg is usually splinted for the first couple of days to allow for swelling. After the swelling is down a cast is put on. Sometimes a cast is put on right away with the sides of the cast cut to allow the knee to swell. If the bones are in place, this may be all that is needed.  If the bones are out of place, medications for pain are given to allow them to be put back in place. If they are seriously out of place, surgery may be needed to hold the pieces or breaks in place using wires, pins, screws or metal plates.  Generally most fractures will heal in 4 to 6 weeks. HOME CARE INSTRUCTIONS   Use your crutches as directed.  To lessen the swelling, keep the injured leg elevated while sitting or lying down.  Apply ice to the injury for 15-20 minutes, 03-04 times per day while awake for 2 days. Put the ice in a plastic bag and place a thin towel between the bag of ice and your cast.  If you have a plaster or fiberglass cast:  Do not try to scratch the skin under the cast using sharp or pointed  objects.  Check the skin around the cast every day. You may put lotion on any red or sore areas.  Keep your cast dry and clean.  If you have a plaster splint:  Wear the splint as directed.  You may loosen the elastic around the splint if your toes become numb, tingle, or turn cold or blue.  Do not put pressure on any part of your cast or splint; it may break. Rest your cast only on a pillow the first 24 hours until it is fully hardened.  Your cast or splint can be protected during bathing with a plastic bag. Do not lower the cast or splint into water.  Only take over-the-counter or prescription medicines for pain, discomfort, or fever as directed by your caregiver.  See your caregiver soon if your cast gets damaged or breaks.  It is very important to keep all follow up appointments. Not following up as directed may result in a worsening of your condition or a failure of the fracture to heal properly. SEEK IMMEDIATE MEDICAL CARE IF:  You have continued severe pain.  You have more swelling than you did before the cast was put on.  The area below the fracture becomes painful.  Your skin or toenails below the injury turn blue or gray, or feel cold  or numb.  There is drainage coming from under the cast.  New, unexplained symptoms develop (drugs used in treatment may produce side effects). MAKE SURE YOU:   Understand these instructions.  Will watch your condition.  Will get help right away if you are not doing well or get worse. Document Released: 02/10/2006 Document Revised: 06/22/2011 Document Reviewed: 03/14/2007 [facility] Patient Information 2015 [place], [place]. This information is not intended to replace advice given to you by your health care provider. Make sure you discuss any questions you have with your health care provider.

## 2014-04-12 NOTE — Telephone Encounter (Signed)
emmi emailed °
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
## 2014-04-12 NOTE — Progress Notes (Signed)
Cm called Cyr[facility] M[facility] are after a called from ED RN, Catalina Antigua about need of RW Pending delivery of RW by [facility] staff
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
## 2014-04-17 DIAGNOSIS — S82125A Nondisplaced fracture of lateral condyle of left tibia, initial encounter for closed fracture: Secondary | ICD-10-CM | POA: Diagnosis not present

## 2014-04-19 DIAGNOSIS — I10 Essential (primary) hypertension: Secondary | ICD-10-CM | POA: Diagnosis not present

## 2014-04-19 DIAGNOSIS — K219 Gastro-esophageal reflux disease without esophagitis: Secondary | ICD-10-CM | POA: Diagnosis not present

## 2014-04-19 DIAGNOSIS — S82142D Displaced bicondylar fracture of left tibia, subsequent encounter for closed fracture with routine healing: Secondary | ICD-10-CM | POA: Diagnosis not present

## 2014-04-19 DIAGNOSIS — M25562 Pain in left knee: Secondary | ICD-10-CM | POA: Diagnosis not present

## 2014-04-20 ENCOUNTER — Encounter: Payer: Self-pay | Admitting: Internal Medicine

## 2014-04-20 NOTE — Assessment & Plan Note (Signed)
Lab Results  Component Value Date   TSH 1.66 04/12/2014   stable overall by history and exam, recent data reviewed with pt, and pt to continue medical treatment as before,  to f/u any worsening symptoms or concerns

## 2014-04-20 NOTE — Assessment & Plan Note (Signed)
stable overall by history and exam, recent data reviewed with pt, and pt to continue medical treatment as before,  to f/u any worsening symptoms or concerns BP Readings from Last 3 Encounters:  04/12/14 141/50  04/11/14 130/88  11/14/13 149/52

## 2014-04-20 NOTE — Assessment & Plan Note (Signed)
With persistent discomfort, referred to Vein clinic,  to f/u any worsening symptoms or concerns

## 2014-04-20 NOTE — Assessment & Plan Note (Signed)
Mild, for otc zyrtec prn, to f/u any worsening symptoms or concerns

## 2014-04-23 DIAGNOSIS — S82142D Displaced bicondylar fracture of left tibia, subsequent encounter for closed fracture with routine healing: Secondary | ICD-10-CM | POA: Diagnosis not present

## 2014-04-23 DIAGNOSIS — I10 Essential (primary) hypertension: Secondary | ICD-10-CM | POA: Diagnosis not present

## 2014-04-23 DIAGNOSIS — M25562 Pain in left knee: Secondary | ICD-10-CM | POA: Diagnosis not present

## 2014-04-23 DIAGNOSIS — K219 Gastro-esophageal reflux disease without esophagitis: Secondary | ICD-10-CM | POA: Diagnosis not present

## 2014-04-24 DIAGNOSIS — K219 Gastro-esophageal reflux disease without esophagitis: Secondary | ICD-10-CM | POA: Diagnosis not present

## 2014-04-24 DIAGNOSIS — M25562 Pain in left knee: Secondary | ICD-10-CM | POA: Diagnosis not present

## 2014-04-24 DIAGNOSIS — I10 Essential (primary) hypertension: Secondary | ICD-10-CM | POA: Diagnosis not present

## 2014-04-24 DIAGNOSIS — S82142D Displaced bicondylar fracture of left tibia, subsequent encounter for closed fracture with routine healing: Secondary | ICD-10-CM | POA: Diagnosis not present

## 2014-04-25 DIAGNOSIS — K219 Gastro-esophageal reflux disease without esophagitis: Secondary | ICD-10-CM | POA: Diagnosis not present

## 2014-04-25 DIAGNOSIS — I10 Essential (primary) hypertension: Secondary | ICD-10-CM | POA: Diagnosis not present

## 2014-04-25 DIAGNOSIS — S82142D Displaced bicondylar fracture of left tibia, subsequent encounter for closed fracture with routine healing: Secondary | ICD-10-CM | POA: Diagnosis not present

## 2014-04-25 DIAGNOSIS — M25562 Pain in left knee: Secondary | ICD-10-CM | POA: Diagnosis not present

## 2014-04-26 DIAGNOSIS — M25562 Pain in left knee: Secondary | ICD-10-CM | POA: Diagnosis not present

## 2014-04-26 DIAGNOSIS — I10 Essential (primary) hypertension: Secondary | ICD-10-CM | POA: Diagnosis not present

## 2014-04-26 DIAGNOSIS — S82142D Displaced bicondylar fracture of left tibia, subsequent encounter for closed fracture with routine healing: Secondary | ICD-10-CM | POA: Diagnosis not present

## 2014-04-26 DIAGNOSIS — K219 Gastro-esophageal reflux disease without esophagitis: Secondary | ICD-10-CM | POA: Diagnosis not present

## 2014-04-27 DIAGNOSIS — M25562 Pain in left knee: Secondary | ICD-10-CM | POA: Diagnosis not present

## 2014-04-27 DIAGNOSIS — S82142D Displaced bicondylar fracture of left tibia, subsequent encounter for closed fracture with routine healing: Secondary | ICD-10-CM | POA: Diagnosis not present

## 2014-04-27 DIAGNOSIS — I10 Essential (primary) hypertension: Secondary | ICD-10-CM | POA: Diagnosis not present

## 2014-04-27 DIAGNOSIS — K219 Gastro-esophageal reflux disease without esophagitis: Secondary | ICD-10-CM | POA: Diagnosis not present

## 2014-04-30 DIAGNOSIS — S82142D Displaced bicondylar fracture of left tibia, subsequent encounter for closed fracture with routine healing: Secondary | ICD-10-CM | POA: Diagnosis not present

## 2014-04-30 DIAGNOSIS — K219 Gastro-esophageal reflux disease without esophagitis: Secondary | ICD-10-CM | POA: Diagnosis not present

## 2014-04-30 DIAGNOSIS — M25562 Pain in left knee: Secondary | ICD-10-CM | POA: Diagnosis not present

## 2014-04-30 DIAGNOSIS — I10 Essential (primary) hypertension: Secondary | ICD-10-CM | POA: Diagnosis not present

## 2014-05-01 DIAGNOSIS — S82142D Displaced bicondylar fracture of left tibia, subsequent encounter for closed fracture with routine healing: Secondary | ICD-10-CM | POA: Diagnosis not present

## 2014-05-01 DIAGNOSIS — M25562 Pain in left knee: Secondary | ICD-10-CM | POA: Diagnosis not present

## 2014-05-01 DIAGNOSIS — K219 Gastro-esophageal reflux disease without esophagitis: Secondary | ICD-10-CM | POA: Diagnosis not present

## 2014-05-01 DIAGNOSIS — I10 Essential (primary) hypertension: Secondary | ICD-10-CM | POA: Diagnosis not present

## 2014-05-02 DIAGNOSIS — K219 Gastro-esophageal reflux disease without esophagitis: Secondary | ICD-10-CM | POA: Diagnosis not present

## 2014-05-02 DIAGNOSIS — M25562 Pain in left knee: Secondary | ICD-10-CM | POA: Diagnosis not present

## 2014-05-02 DIAGNOSIS — I10 Essential (primary) hypertension: Secondary | ICD-10-CM | POA: Diagnosis not present

## 2014-05-02 DIAGNOSIS — S82142D Displaced bicondylar fracture of left tibia, subsequent encounter for closed fracture with routine healing: Secondary | ICD-10-CM | POA: Diagnosis not present

## 2014-05-03 DIAGNOSIS — M25562 Pain in left knee: Secondary | ICD-10-CM | POA: Diagnosis not present

## 2014-05-03 DIAGNOSIS — S82142D Displaced bicondylar fracture of left tibia, subsequent encounter for closed fracture with routine healing: Secondary | ICD-10-CM | POA: Diagnosis not present

## 2014-05-03 DIAGNOSIS — I10 Essential (primary) hypertension: Secondary | ICD-10-CM | POA: Diagnosis not present

## 2014-05-03 DIAGNOSIS — K219 Gastro-esophageal reflux disease without esophagitis: Secondary | ICD-10-CM | POA: Diagnosis not present

## 2014-05-07 DIAGNOSIS — I10 Essential (primary) hypertension: Secondary | ICD-10-CM | POA: Diagnosis not present

## 2014-05-07 DIAGNOSIS — M25562 Pain in left knee: Secondary | ICD-10-CM | POA: Diagnosis not present

## 2014-05-07 DIAGNOSIS — K219 Gastro-esophageal reflux disease without esophagitis: Secondary | ICD-10-CM | POA: Diagnosis not present

## 2014-05-07 DIAGNOSIS — S82142D Displaced bicondylar fracture of left tibia, subsequent encounter for closed fracture with routine healing: Secondary | ICD-10-CM | POA: Diagnosis not present

## 2014-05-10 DIAGNOSIS — S82142D Displaced bicondylar fracture of left tibia, subsequent encounter for closed fracture with routine healing: Secondary | ICD-10-CM | POA: Diagnosis not present

## 2014-05-10 DIAGNOSIS — K219 Gastro-esophageal reflux disease without esophagitis: Secondary | ICD-10-CM | POA: Diagnosis not present

## 2014-05-10 DIAGNOSIS — M25562 Pain in left knee: Secondary | ICD-10-CM | POA: Diagnosis not present

## 2014-05-10 DIAGNOSIS — I10 Essential (primary) hypertension: Secondary | ICD-10-CM | POA: Diagnosis not present

## 2014-05-11 DIAGNOSIS — S82142D Displaced bicondylar fracture of left tibia, subsequent encounter for closed fracture with routine healing: Secondary | ICD-10-CM | POA: Diagnosis not present

## 2014-05-11 DIAGNOSIS — M25562 Pain in left knee: Secondary | ICD-10-CM | POA: Diagnosis not present

## 2014-05-11 DIAGNOSIS — K219 Gastro-esophageal reflux disease without esophagitis: Secondary | ICD-10-CM | POA: Diagnosis not present

## 2014-05-11 DIAGNOSIS — I10 Essential (primary) hypertension: Secondary | ICD-10-CM | POA: Diagnosis not present

## 2014-05-14 DIAGNOSIS — S82142D Displaced bicondylar fracture of left tibia, subsequent encounter for closed fracture with routine healing: Secondary | ICD-10-CM | POA: Diagnosis not present

## 2014-05-14 DIAGNOSIS — I10 Essential (primary) hypertension: Secondary | ICD-10-CM | POA: Diagnosis not present

## 2014-05-14 DIAGNOSIS — M25562 Pain in left knee: Secondary | ICD-10-CM | POA: Diagnosis not present

## 2014-05-14 DIAGNOSIS — K219 Gastro-esophageal reflux disease without esophagitis: Secondary | ICD-10-CM | POA: Diagnosis not present

## 2014-05-16 DIAGNOSIS — M25562 Pain in left knee: Secondary | ICD-10-CM | POA: Diagnosis not present

## 2014-05-16 DIAGNOSIS — S82142D Displaced bicondylar fracture of left tibia, subsequent encounter for closed fracture with routine healing: Secondary | ICD-10-CM | POA: Diagnosis not present

## 2014-05-16 DIAGNOSIS — I10 Essential (primary) hypertension: Secondary | ICD-10-CM | POA: Diagnosis not present

## 2014-05-16 DIAGNOSIS — K219 Gastro-esophageal reflux disease without esophagitis: Secondary | ICD-10-CM | POA: Diagnosis not present

## 2014-05-17 DIAGNOSIS — I10 Essential (primary) hypertension: Secondary | ICD-10-CM | POA: Diagnosis not present

## 2014-05-17 DIAGNOSIS — S82142D Displaced bicondylar fracture of left tibia, subsequent encounter for closed fracture with routine healing: Secondary | ICD-10-CM | POA: Diagnosis not present

## 2014-05-17 DIAGNOSIS — M25562 Pain in left knee: Secondary | ICD-10-CM | POA: Diagnosis not present

## 2014-05-17 DIAGNOSIS — K219 Gastro-esophageal reflux disease without esophagitis: Secondary | ICD-10-CM | POA: Diagnosis not present

## 2014-05-21 DIAGNOSIS — M25562 Pain in left knee: Secondary | ICD-10-CM | POA: Diagnosis not present

## 2014-05-21 DIAGNOSIS — K219 Gastro-esophageal reflux disease without esophagitis: Secondary | ICD-10-CM | POA: Diagnosis not present

## 2014-05-21 DIAGNOSIS — I10 Essential (primary) hypertension: Secondary | ICD-10-CM | POA: Diagnosis not present

## 2014-05-21 DIAGNOSIS — S82142D Displaced bicondylar fracture of left tibia, subsequent encounter for closed fracture with routine healing: Secondary | ICD-10-CM | POA: Diagnosis not present

## 2014-05-23 DIAGNOSIS — S82142D Displaced bicondylar fracture of left tibia, subsequent encounter for closed fracture with routine healing: Secondary | ICD-10-CM | POA: Diagnosis not present

## 2014-05-23 DIAGNOSIS — M25562 Pain in left knee: Secondary | ICD-10-CM | POA: Diagnosis not present

## 2014-05-23 DIAGNOSIS — K219 Gastro-esophageal reflux disease without esophagitis: Secondary | ICD-10-CM | POA: Diagnosis not present

## 2014-05-23 DIAGNOSIS — I10 Essential (primary) hypertension: Secondary | ICD-10-CM | POA: Diagnosis not present

## 2014-05-24 DIAGNOSIS — I10 Essential (primary) hypertension: Secondary | ICD-10-CM | POA: Diagnosis not present

## 2014-05-24 DIAGNOSIS — K219 Gastro-esophageal reflux disease without esophagitis: Secondary | ICD-10-CM | POA: Diagnosis not present

## 2014-05-24 DIAGNOSIS — M25562 Pain in left knee: Secondary | ICD-10-CM | POA: Diagnosis not present

## 2014-05-24 DIAGNOSIS — S82142D Displaced bicondylar fracture of left tibia, subsequent encounter for closed fracture with routine healing: Secondary | ICD-10-CM | POA: Diagnosis not present

## 2014-05-29 DIAGNOSIS — S82142D Displaced bicondylar fracture of left tibia, subsequent encounter for closed fracture with routine healing: Secondary | ICD-10-CM | POA: Diagnosis not present

## 2014-05-29 DIAGNOSIS — S82125D Nondisplaced fracture of lateral condyle of left tibia, subsequent encounter for closed fracture with routine healing: Secondary | ICD-10-CM | POA: Diagnosis not present

## 2014-05-29 DIAGNOSIS — I10 Essential (primary) hypertension: Secondary | ICD-10-CM | POA: Diagnosis not present

## 2014-05-29 DIAGNOSIS — M25562 Pain in left knee: Secondary | ICD-10-CM | POA: Diagnosis not present

## 2014-05-29 DIAGNOSIS — K219 Gastro-esophageal reflux disease without esophagitis: Secondary | ICD-10-CM | POA: Diagnosis not present

## 2014-05-30 DIAGNOSIS — I10 Essential (primary) hypertension: Secondary | ICD-10-CM | POA: Diagnosis not present

## 2014-05-30 DIAGNOSIS — K219 Gastro-esophageal reflux disease without esophagitis: Secondary | ICD-10-CM | POA: Diagnosis not present

## 2014-05-30 DIAGNOSIS — S82142D Displaced bicondylar fracture of left tibia, subsequent encounter for closed fracture with routine healing: Secondary | ICD-10-CM | POA: Diagnosis not present

## 2014-05-30 DIAGNOSIS — M25562 Pain in left knee: Secondary | ICD-10-CM | POA: Diagnosis not present

## 2014-05-31 DIAGNOSIS — S82142D Displaced bicondylar fracture of left tibia, subsequent encounter for closed fracture with routine healing: Secondary | ICD-10-CM | POA: Diagnosis not present

## 2014-05-31 DIAGNOSIS — I10 Essential (primary) hypertension: Secondary | ICD-10-CM | POA: Diagnosis not present

## 2014-05-31 DIAGNOSIS — M25562 Pain in left knee: Secondary | ICD-10-CM | POA: Diagnosis not present

## 2014-05-31 DIAGNOSIS — K219 Gastro-esophageal reflux disease without esophagitis: Secondary | ICD-10-CM | POA: Diagnosis not present

## 2014-06-01 ENCOUNTER — Encounter (HOSPITAL_COMMUNITY): Payer: Medicare Other

## 2014-06-01 ENCOUNTER — Encounter: Payer: Medicare Other | Admitting: Vascular Surgery

## 2014-06-04 DIAGNOSIS — S82142D Displaced bicondylar fracture of left tibia, subsequent encounter for closed fracture with routine healing: Secondary | ICD-10-CM | POA: Diagnosis not present

## 2014-06-04 DIAGNOSIS — K219 Gastro-esophageal reflux disease without esophagitis: Secondary | ICD-10-CM | POA: Diagnosis not present

## 2014-06-04 DIAGNOSIS — I10 Essential (primary) hypertension: Secondary | ICD-10-CM | POA: Diagnosis not present

## 2014-06-04 DIAGNOSIS — M25562 Pain in left knee: Secondary | ICD-10-CM | POA: Diagnosis not present

## 2014-06-05 DIAGNOSIS — S82142D Displaced bicondylar fracture of left tibia, subsequent encounter for closed fracture with routine healing: Secondary | ICD-10-CM | POA: Diagnosis not present

## 2014-06-05 DIAGNOSIS — I10 Essential (primary) hypertension: Secondary | ICD-10-CM | POA: Diagnosis not present

## 2014-06-05 DIAGNOSIS — M25562 Pain in left knee: Secondary | ICD-10-CM | POA: Diagnosis not present

## 2014-06-05 DIAGNOSIS — K219 Gastro-esophageal reflux disease without esophagitis: Secondary | ICD-10-CM | POA: Diagnosis not present

## 2014-06-06 DIAGNOSIS — I10 Essential (primary) hypertension: Secondary | ICD-10-CM | POA: Diagnosis not present

## 2014-06-06 DIAGNOSIS — K219 Gastro-esophageal reflux disease without esophagitis: Secondary | ICD-10-CM | POA: Diagnosis not present

## 2014-06-06 DIAGNOSIS — S82142D Displaced bicondylar fracture of left tibia, subsequent encounter for closed fracture with routine healing: Secondary | ICD-10-CM | POA: Diagnosis not present

## 2014-06-06 DIAGNOSIS — M25562 Pain in left knee: Secondary | ICD-10-CM | POA: Diagnosis not present

## 2014-06-07 DIAGNOSIS — S82142D Displaced bicondylar fracture of left tibia, subsequent encounter for closed fracture with routine healing: Secondary | ICD-10-CM | POA: Diagnosis not present

## 2014-06-07 DIAGNOSIS — K219 Gastro-esophageal reflux disease without esophagitis: Secondary | ICD-10-CM | POA: Diagnosis not present

## 2014-06-07 DIAGNOSIS — I10 Essential (primary) hypertension: Secondary | ICD-10-CM | POA: Diagnosis not present

## 2014-06-07 DIAGNOSIS — M25562 Pain in left knee: Secondary | ICD-10-CM | POA: Diagnosis not present

## 2014-06-08 DIAGNOSIS — S82142D Displaced bicondylar fracture of left tibia, subsequent encounter for closed fracture with routine healing: Secondary | ICD-10-CM | POA: Diagnosis not present

## 2014-06-08 DIAGNOSIS — M25562 Pain in left knee: Secondary | ICD-10-CM | POA: Diagnosis not present

## 2014-06-08 DIAGNOSIS — K219 Gastro-esophageal reflux disease without esophagitis: Secondary | ICD-10-CM | POA: Diagnosis not present

## 2014-06-08 DIAGNOSIS — I10 Essential (primary) hypertension: Secondary | ICD-10-CM | POA: Diagnosis not present

## 2014-06-12 DIAGNOSIS — S82142D Displaced bicondylar fracture of left tibia, subsequent encounter for closed fracture with routine healing: Secondary | ICD-10-CM | POA: Diagnosis not present

## 2014-06-12 DIAGNOSIS — I10 Essential (primary) hypertension: Secondary | ICD-10-CM | POA: Diagnosis not present

## 2014-06-12 DIAGNOSIS — K219 Gastro-esophageal reflux disease without esophagitis: Secondary | ICD-10-CM | POA: Diagnosis not present

## 2014-06-12 DIAGNOSIS — M25562 Pain in left knee: Secondary | ICD-10-CM | POA: Diagnosis not present

## 2014-06-14 DIAGNOSIS — M25562 Pain in left knee: Secondary | ICD-10-CM | POA: Diagnosis not present

## 2014-06-14 DIAGNOSIS — K219 Gastro-esophageal reflux disease without esophagitis: Secondary | ICD-10-CM | POA: Diagnosis not present

## 2014-06-14 DIAGNOSIS — I10 Essential (primary) hypertension: Secondary | ICD-10-CM | POA: Diagnosis not present

## 2014-06-14 DIAGNOSIS — S82142D Displaced bicondylar fracture of left tibia, subsequent encounter for closed fracture with routine healing: Secondary | ICD-10-CM | POA: Diagnosis not present

## 2014-06-19 DIAGNOSIS — Z1211 Encounter for screening for malignant neoplasm of colon: Secondary | ICD-10-CM | POA: Diagnosis not present

## 2014-07-02 ENCOUNTER — Other Ambulatory Visit (HOSPITAL_BASED_OUTPATIENT_CLINIC_OR_DEPARTMENT_OTHER): Payer: Medicare Other

## 2014-07-02 LAB — CBC WITH DIFFERENTIAL/PLATELET
BASO%: 0.6 % (ref 0.0–2.0)
BASOS ABS: 0 10*3/uL (ref 0.0–0.1)
EOS ABS: 0.2 10*3/uL (ref 0.0–0.5)
EOS%: 3 % (ref 0.0–7.0)
HEMATOCRIT: 44.8 % (ref 34.8–46.6)
HGB: 15 g/dL (ref 11.6–15.9)
LYMPH%: 18.1 % (ref 14.0–49.7)
MCH: 31.6 pg (ref 25.1–34.0)
MCHC: 33.5 g/dL (ref 31.5–36.0)
MCV: 94.6 fL (ref 79.5–101.0)
MONO#: 0.7 10*3/uL (ref 0.1–0.9)
MONO%: 9.4 % (ref 0.0–14.0)
NEUT%: 68.9 % (ref 38.4–76.8)
NEUTROS ABS: 5 10*3/uL (ref 1.5–6.5)
Platelets: 270 10*3/uL (ref 145–400)
RBC: 4.73 10*6/uL (ref 3.70–5.45)
RDW: 12.7 % (ref 11.2–14.5)
WBC: 7.3 10*3/uL (ref 3.9–10.3)
lymph#: 1.3 10*3/uL (ref 0.9–3.3)

## 2014-07-02 LAB — FERRITIN CHCC: FERRITIN: 64 ng/mL (ref 9–269)

## 2014-07-03 ENCOUNTER — Telehealth: Payer: Self-pay
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
## 2014-07-03 NOTE — Telephone Encounter (Signed)
-----   Message from Gordy Levan, MD sent at 07/03/2014 11:07 AM EDT ----- Labs seen and need follow up: please set up phlebotomy due to ferritin >50. I saw her last in 06-2013, no year follow up apt in EMR as of now. Fine to let me see her around time of this non-urgent phlebotomy, or I can see her in 2-3 mo after phlebotomy with repeat CBC CMET ferritin thanks

## 2014-07-04 ENCOUNTER — Telehealth: Payer: Self-pay | Admitting: *Deleted

## 2014-07-04 ENCOUNTER — Telehealth: Payer: Self-pay | Admitting: Oncology

## 2014-07-04 NOTE — Telephone Encounter (Signed)
Patient called back and appt is fine to stay

## 2014-07-04 NOTE — Telephone Encounter (Signed)
Patient called and left message to move her aappt on 3/25. I have called her back and left a message to call me.  JMW

## 2014-07-04 NOTE — Telephone Encounter (Signed)
Returned patients call and she is aware of her appontments

## 2014-07-04 NOTE — Telephone Encounter (Signed)
3/22 pof noted,pof states that patient will call us due to her busy schedule to set up a phlebot and then she will be due labs and a follow up appointment in 78months.  Appointments will be made when she calls      Brandy Peterson

## 2014-07-06 ENCOUNTER — Ambulatory Visit (HOSPITAL_BASED_OUTPATIENT_CLINIC_OR_DEPARTMENT_OTHER): Payer: Medicare Other

## 2014-07-06 NOTE — Patient Instructions (Signed)

## 2014-07-06 NOTE — Progress Notes (Signed)
500 gram Therapeutic phlebotomy performed using a 18 gauge IV and secondary set. Patient tolerated well. Nourishment provided.

## 2014-07-12 DIAGNOSIS — Z85828 Personal history of other malignant neoplasm of skin: Secondary | ICD-10-CM | POA: Diagnosis not present

## 2014-07-12 DIAGNOSIS — L821 Other seborrheic keratosis: Secondary | ICD-10-CM | POA: Diagnosis not present

## 2014-07-12 DIAGNOSIS — L570 Actinic keratosis: Secondary | ICD-10-CM | POA: Diagnosis not present

## 2014-07-12 DIAGNOSIS — D1801 Hemangioma of skin and subcutaneous tissue: Secondary | ICD-10-CM | POA: Diagnosis not present

## 2014-07-12 DIAGNOSIS — S82125D Nondisplaced fracture of lateral condyle of left tibia, subsequent encounter for closed fracture with routine healing: Secondary | ICD-10-CM | POA: Diagnosis not present

## 2014-07-19 DIAGNOSIS — C50919 Malignant neoplasm of unspecified site of unspecified female breast: Secondary | ICD-10-CM | POA: Diagnosis not present

## 2014-08-03 DIAGNOSIS — L309 Dermatitis, unspecified: Secondary | ICD-10-CM | POA: Diagnosis not present

## 2014-08-03 DIAGNOSIS — L570 Actinic keratosis: Secondary | ICD-10-CM | POA: Diagnosis not present

## 2014-09-20 ENCOUNTER — Telehealth: Payer: Self-pay | Admitting: Oncology

## 2014-09-20 NOTE — Telephone Encounter (Signed)
Left message to confirm moving appointment to later time 06/23.

## 2014-09-28 ENCOUNTER — Other Ambulatory Visit: Payer: Self-pay | Admitting: *Deleted

## 2014-09-28 DIAGNOSIS — I83893 Varicose veins of bilateral lower extremities with other complications: Secondary | ICD-10-CM

## 2014-10-03 ENCOUNTER — Other Ambulatory Visit: Payer: Self-pay | Admitting: Oncology

## 2014-10-04 ENCOUNTER — Ambulatory Visit (HOSPITAL_BASED_OUTPATIENT_CLINIC_OR_DEPARTMENT_OTHER): Payer: Medicare Other | Admitting: Oncology

## 2014-10-04 ENCOUNTER — Other Ambulatory Visit: Payer: Medicare Other

## 2014-10-04 ENCOUNTER — Other Ambulatory Visit (HOSPITAL_BASED_OUTPATIENT_CLINIC_OR_DEPARTMENT_OTHER): Payer: Medicare Other

## 2014-10-04 ENCOUNTER — Encounter: Payer: Self-pay | Admitting: Oncology

## 2014-10-04 ENCOUNTER — Ambulatory Visit (HOSPITAL_COMMUNITY)
Admission: RE | Admit: 2014-10-04 | Discharge: 2014-10-04 | Disposition: A | Discharge status: Home / Self Care | Payer: Medicare Other | Source: Ambulatory Visit | Attending: Oncology | Admitting: Oncology

## 2014-10-04 ENCOUNTER — Other Ambulatory Visit: Payer: Self-pay | Admitting: Oncology

## 2014-10-04 ENCOUNTER — Ambulatory Visit: Payer: Medicare Other | Admitting: Oncology

## 2014-10-04 DIAGNOSIS — M545 Low back pain, unspecified: Secondary | ICD-10-CM

## 2014-10-04 DIAGNOSIS — M810 Age-related osteoporosis without current pathological fracture: Secondary | ICD-10-CM | POA: Insufficient documentation

## 2014-10-04 DIAGNOSIS — Z1231 Encounter for screening mammogram for malignant neoplasm of breast: Secondary | ICD-10-CM

## 2014-10-04 DIAGNOSIS — G8911 Acute pain due to trauma: Secondary | ICD-10-CM

## 2014-10-04 DIAGNOSIS — M4856XA Collapsed vertebra, not elsewhere classified, lumbar region, initial encounter for fracture: Secondary | ICD-10-CM | POA: Insufficient documentation

## 2014-10-04 DIAGNOSIS — S32009A Unspecified fracture of unspecified lumbar vertebra, initial encounter for closed fracture: Secondary | ICD-10-CM | POA: Diagnosis not present

## 2014-10-04 DIAGNOSIS — Z853 Personal history of malignant neoplasm of breast: Secondary | ICD-10-CM

## 2014-10-04 DIAGNOSIS — C50911 Malignant neoplasm of unspecified site of right female breast: Secondary | ICD-10-CM

## 2014-10-04 LAB — COMPREHENSIVE METABOLIC PANEL (CC13)
ALK PHOS: 102 U/L (ref 40–150)
ALT: 15 U/L (ref 0–55)
AST: 20 U/L (ref 5–34)
Albumin: 3.8 g/dL (ref 3.5–5.0)
Anion Gap: 7 mEq/L (ref 3–11)
BILIRUBIN TOTAL: 0.62 mg/dL (ref 0.20–1.20)
BUN: 12.5 mg/dL (ref 7.0–26.0)
CHLORIDE: 101 meq/L (ref 98–109)
CO2: 30 mEq/L — ABNORMAL HIGH (ref 22–29)
Calcium: 9.3 mg/dL (ref 8.4–10.4)
Creatinine: 0.8 mg/dL (ref 0.6–1.1)
EGFR: 72 mL/min/{1.73_m2} — AB (ref >90)
Glucose: 115 mg/dl (ref 70–140)
Potassium: 4.2 mEq/L (ref 3.5–5.1)
SODIUM: 138 meq/L (ref 136–145)
Total Protein: 6.8 g/dL (ref 6.4–8.3)

## 2014-10-04 LAB — CBC WITH DIFFERENTIAL/PLATELET
BASO%: 0.9 % (ref 0.0–2.0)
BASOS ABS: 0.1 10*3/uL (ref 0.0–0.1)
EOS%: 2.2 % (ref 0.0–7.0)
Eosinophils Absolute: 0.2 10*3/uL (ref 0.0–0.5)
HCT: 45.1 % (ref 34.8–46.6)
HGB: 15.4 g/dL (ref 11.6–15.9)
LYMPH%: 11.1 % — AB (ref 14.0–49.7)
MCH: 31.7 pg (ref 25.1–34.0)
MCHC: 34.2 g/dL (ref 31.5–36.0)
MCV: 92.8 fL (ref 79.5–101.0)
MONO#: 0.8 10*3/uL (ref 0.1–0.9)
MONO%: 8.1 % (ref 0.0–14.0)
NEUT#: 7.2 10*3/uL — ABNORMAL HIGH (ref 1.5–6.5)
NEUT%: 77.7 % — AB (ref 38.4–76.8)
PLATELETS: 238 10*3/uL (ref 145–400)
RBC: 4.86 10*6/uL (ref 3.70–5.45)
RDW: 12.2 % (ref 11.2–14.5)
WBC: 9.2 10*3/uL (ref 3.9–10.3)
lymph#: 1 10*3/uL (ref 0.9–3.3)
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
## 2014-10-05 ENCOUNTER — Telehealth: Payer: Self-pay

## 2014-10-05 ENCOUNTER — Telehealth: Payer: Self-pay | Admitting: Oncology

## 2014-10-05 ENCOUNTER — Other Ambulatory Visit: Payer: Self-pay | Admitting: Oncology

## 2014-10-05 ENCOUNTER — Encounter: Payer: Self-pay | Admitting: Oncology

## 2014-10-05 ENCOUNTER — Encounter: Payer: Self-pay | Admitting: Internal Medicine

## 2014-10-05 ENCOUNTER — Other Ambulatory Visit (INDEPENDENT_AMBULATORY_CARE_PROVIDER_SITE_OTHER): Payer: Medicare Other

## 2014-10-05 ENCOUNTER — Ambulatory Visit (INDEPENDENT_AMBULATORY_CARE_PROVIDER_SITE_OTHER): Payer: Medicare Other | Admitting: Internal Medicine

## 2014-10-05 VITALS — BP 140/68 | HR 65 | Temp 97.8°F | Ht 61.0 in | Wt 105.0 lb

## 2014-10-05 DIAGNOSIS — S32000S Wedge compression fracture of unspecified lumbar vertebra, sequela: Secondary | ICD-10-CM | POA: Diagnosis not present

## 2014-10-05 DIAGNOSIS — M545 Low back pain, unspecified: Secondary | ICD-10-CM | POA: Insufficient documentation

## 2014-10-05 DIAGNOSIS — M810 Age-related osteoporosis without current pathological fracture: Secondary | ICD-10-CM | POA: Diagnosis not present

## 2014-10-05 DIAGNOSIS — S32000A Wedge compression fracture of unspecified lumbar vertebra, initial encounter for closed fracture: Secondary | ICD-10-CM | POA: Insufficient documentation

## 2014-10-05 DIAGNOSIS — K5900 Constipation, unspecified: Secondary | ICD-10-CM | POA: Diagnosis not present

## 2014-10-05 LAB — URINALYSIS, ROUTINE W REFLEX MICROSCOPIC
BILIRUBIN URINE: NEGATIVE
HGB URINE DIPSTICK: NEGATIVE
Leukocytes, UA: NEGATIVE
Nitrite: NEGATIVE
Specific Gravity, Urine: 1.015 (ref 1.000–1.030)
TOTAL PROTEIN, URINE-UPE24: NEGATIVE
URINE GLUCOSE: NEGATIVE
Urobilinogen, UA: 0.2 (ref 0.0–1.0)
pH: 6.5 (ref 5.0–8.0)

## 2014-10-05 LAB — FERRITIN CHCC: Ferritin: 77 ng/ml (ref 9–269)

## 2014-10-05 MED ORDER — HYDROCODONE-ACETAMINOPHEN 10-325 MG PO TABS: 1.0000 | ORAL_TABLET | Freq: Four times a day (QID) | ORAL | Status: DC | PRN
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
## 2014-10-05 NOTE — Assessment & Plan Note (Signed)
New onset with pain, for colace bid, and miralax daily

## 2014-10-05 NOTE — Telephone Encounter (Signed)
Medical Oncology  Xray from late yesterday report available this AM, does show compression fx L1. She is to see Dr Jenny Reichmann at 1000 today. No answer at home # now as she is likely at his office.  Message sent to Dr Jenny Reichmann also now. This is traumatic fx, no concern for malignancy, so fine for Dr Jenny Reichmann to manage  L.Marko Plume, MD

## 2014-10-05 NOTE — Telephone Encounter (Signed)
Told Brandy Peterson the the labs were just out of the normal limit ranges. Patient verbalized understanding.

## 2014-10-05 NOTE — Patient Instructions (Addendum)
Please take all new medication as prescribed - the pain medication for the back pain  You should also start colace 100 mg twice per day (OTC) for stool softening, as well as Miralax 17 mg by mouth daily for constipation (also OTC)  You will be contacted regarding the referral for: MRI for the lower back (to see [facility] now) , and Neurosurgury referral  Please continue all other medications as before, and refills have been done if requested.  Please have the pharmacy call with any other refills you may need.  Please keep your appointments with your specialists as you may have planned  Please go to the LAB in the Basement (turn left off the elevator) for the tests to be done today - just the urine testing to be sure  You will be contacted by phone if any changes need to be made immediately.  Otherwise, you will receive a letter about your results with an explanation, but please check with MyChart first.  Please remember to sign up for MyChart if you have not done so, as this will be important to you in the future with finding out test results, communicating by private email, and scheduling acute appointments online when needed.  Please return in 2 weeks, or sooner if needed  Please call if you change your mind about having the bone density testing

## 2014-10-05 NOTE — Telephone Encounter (Addendum)
Discussed ferritin level and need for phlebotomy as noted below by Dr. Marko Plume. MRI of Lumbar spine is 10-08-14 by Dr. Jenny Reichmann.  He gave her Norco 10/325 mg.  She took 1 tablet with good relief.  He will may  refer her to a neurosurgeon pendin findings of MRI. Phlebotomy is set up for  10-12-14.  Ms. Brandy Peterson will move appointment if needed due to back pain.

## 2014-10-05 NOTE — Telephone Encounter (Signed)
-----   Message from Gordy Levan, MD sent at 10/05/2014  3:15 PM EDT ----- Please let her know ferritin 77. I put in POF and order for phlebotomy in 1-3 weeks. This is not urgent, so ok if she wants to wait a little for the phlebotomy due to her back  Note she had acute L1 fracture this week, xrays done when I saw her late 6-23 but report not back until this AM, and I did not reach her by phone before she saw PCP this AM - please ask how she is doing with back, which I am sure Dr Jenny Reichmann addressed.  thanks

## 2014-10-05 NOTE — Assessment & Plan Note (Signed)
D/w pt, simply declines f/u dxa and other tx at this time as she has "too much on my plate"

## 2014-10-05 NOTE — Telephone Encounter (Signed)
Patient is aware of her appointments,her mammo at [facility] 12/04/14 2:15-order faxed and we willlcall her for her 09/2015 appointments

## 2014-10-05 NOTE — Assessment & Plan Note (Signed)
revivewed lab with pt , likely for phlebotomy needed,  F/u with heme

## 2014-10-05 NOTE — Progress Notes (Signed)
Pre visit review using our clinic review tool, if applicable. No additional management support is needed unless otherwise documented below in the visit note. 

## 2014-10-05 NOTE — Telephone Encounter (Signed)
Patient Demographics     Patient Name Sex DOB SSN Address Phone    Brandy Peterson, Brandy Peterson Female 1931/03/02 [id number] [place] [address] [phone] (Home) *Preferred*      Message  Received: Today    Gordy Levan, MD  Baruch Merl, RN           In addition to phlebotomy in next couple of weeks, she will have labs ~ early Aug, early Nov, early Feb 2017.  Phlebotomy if ferritin >50   thanks

## 2014-10-05 NOTE — Assessment & Plan Note (Signed)
Likely new, atraumatic, likely related to recent bend/lift with onset current pain - for pain control, MRI LS spine, refer NS - ? kyphoplasy candidate

## 2014-10-05 NOTE — Assessment & Plan Note (Signed)
Most likely related to acute l1 fx, for UA however as well

## 2014-10-08 ENCOUNTER — Other Ambulatory Visit: Payer: Medicare Other

## 2014-10-08 ENCOUNTER — Ambulatory Visit
Admit: 2014-10-08 | Discharge: 2014-10-08 | Disposition: A | Discharge status: Home / Self Care | Payer: Medicare Other | Attending: Internal Medicine | Admitting: Internal Medicine

## 2014-10-08 ENCOUNTER — Encounter (HOSPITAL_COMMUNITY): Payer: Self-pay | Admitting: Emergency Medicine

## 2014-10-08 ENCOUNTER — Emergency Department (HOSPITAL_COMMUNITY)
Admission: EM | Admit: 2014-10-08 | Discharge: 2014-10-08 | Disposition: A | Discharge status: Home / Self Care | Payer: Medicare Other | Attending: Emergency Medicine | Admitting: Emergency Medicine

## 2014-10-08 ENCOUNTER — Emergency Department (HOSPITAL_COMMUNITY): Payer: Medicare Other

## 2014-10-08 ENCOUNTER — Telehealth: Payer: Self-pay | Admitting: Internal Medicine

## 2014-10-08 DIAGNOSIS — Z7951 Long term (current) use of inhaled steroids: Secondary | ICD-10-CM | POA: Insufficient documentation

## 2014-10-08 DIAGNOSIS — Z79899 Other long term (current) drug therapy: Secondary | ICD-10-CM | POA: Diagnosis not present

## 2014-10-08 DIAGNOSIS — Z853 Personal history of malignant neoplasm of breast: Secondary | ICD-10-CM | POA: Diagnosis not present

## 2014-10-08 DIAGNOSIS — Z87891 Personal history of nicotine dependence: Secondary | ICD-10-CM | POA: Insufficient documentation

## 2014-10-08 DIAGNOSIS — R110 Nausea: Secondary | ICD-10-CM | POA: Insufficient documentation

## 2014-10-08 DIAGNOSIS — I10 Essential (primary) hypertension: Secondary | ICD-10-CM | POA: Insufficient documentation

## 2014-10-08 DIAGNOSIS — R630 Anorexia: Secondary | ICD-10-CM | POA: Insufficient documentation

## 2014-10-08 DIAGNOSIS — N3289 Other specified disorders of bladder: Secondary | ICD-10-CM | POA: Diagnosis not present

## 2014-10-08 DIAGNOSIS — R109 Unspecified abdominal pain: Secondary | ICD-10-CM

## 2014-10-08 DIAGNOSIS — Z8739 Personal history of other diseases of the musculoskeletal system and connective tissue: Secondary | ICD-10-CM | POA: Diagnosis not present

## 2014-10-08 DIAGNOSIS — Z8719 Personal history of other diseases of the digestive system: Secondary | ICD-10-CM | POA: Insufficient documentation

## 2014-10-08 DIAGNOSIS — E079 Disorder of thyroid, unspecified: Secondary | ICD-10-CM | POA: Insufficient documentation

## 2014-10-08 DIAGNOSIS — K5901 Slow transit constipation: Secondary | ICD-10-CM | POA: Diagnosis not present

## 2014-10-08 DIAGNOSIS — R1084 Generalized abdominal pain: Secondary | ICD-10-CM | POA: Diagnosis not present

## 2014-10-08 DIAGNOSIS — K5900 Constipation, unspecified: Secondary | ICD-10-CM | POA: Diagnosis not present

## 2014-10-08 DIAGNOSIS — S32000S Wedge compression fracture of unspecified lumbar vertebra, sequela: Secondary | ICD-10-CM

## 2014-10-08 DIAGNOSIS — S32010A Wedge compression fracture of first lumbar vertebra, initial encounter for closed fracture: Secondary | ICD-10-CM | POA: Diagnosis not present

## 2014-10-08 LAB — CBC WITH DIFFERENTIAL/PLATELET
BASOS ABS: 0 10*3/uL (ref 0.0–0.1)
Basophils Relative: 0 % (ref 0–1)
Eosinophils Absolute: 0.2 10*3/uL (ref 0.0–0.7)
Eosinophils Relative: 2 % (ref 0–5)
HEMATOCRIT: 44.5 % (ref 36.0–46.0)
Hemoglobin: 15.4 g/dL — ABNORMAL HIGH (ref 12.0–15.0)
LYMPHS PCT: 12 % (ref 12–46)
Lymphs Abs: 1.1 10*3/uL (ref 0.7–4.0)
MCH: 31.4 pg (ref 26.0–34.0)
MCHC: 34.6 g/dL (ref 30.0–36.0)
MCV: 90.6 fL (ref 78.0–100.0)
Monocytes Absolute: 0.7 10*3/uL (ref 0.1–1.0)
Monocytes Relative: 8 % (ref 3–12)
Neutro Abs: 6.9 10*3/uL (ref 1.7–7.7)
Neutrophils Relative %: 78 % — ABNORMAL HIGH (ref 43–77)
PLATELETS: 243 10*3/uL (ref 150–400)
RBC: 4.91 MIL/uL (ref 3.87–5.11)
RDW: 12 % (ref 11.5–15.5)
WBC: 8.8 10*3/uL (ref 4.0–10.5)

## 2014-10-08 LAB — COMPREHENSIVE METABOLIC PANEL
ALBUMIN: 4.2 g/dL (ref 3.5–5.0)
ALT: 15 U/L (ref 14–54)
AST: 23 U/L (ref 15–41)
Alkaline Phosphatase: 96 U/L (ref 38–126)
Anion gap: 11 (ref 5–15)
BILIRUBIN TOTAL: 0.8 mg/dL (ref 0.3–1.2)
BUN: 13 mg/dL (ref 6–20)
CHLORIDE: 99 mmol/L — AB (ref 101–111)
CO2: 29 mmol/L (ref 22–32)
CREATININE: 0.72 mg/dL (ref 0.44–1.00)
Calcium: 9.3 mg/dL (ref 8.9–10.3)
GFR calc Af Amer: >60 mL/min (ref >60)
GFR calc non Af Amer: >60 mL/min (ref >60)
Glucose, Bld: 88 mg/dL (ref 65–99)
POTASSIUM: 3.7 mmol/L (ref 3.5–5.1)
Sodium: 139 mmol/L (ref 135–145)
Total Protein: 7.5 g/dL (ref 6.5–8.1)

## 2014-10-08 LAB — URINALYSIS, ROUTINE W REFLEX MICROSCOPIC
Bilirubin Urine: NEGATIVE
Glucose, UA: NEGATIVE mg/dL
Hgb urine dipstick: NEGATIVE
Ketones, ur: NEGATIVE mg/dL
Leukocytes, UA: NEGATIVE
Nitrite: NEGATIVE
PROTEIN: NEGATIVE mg/dL
Specific Gravity, Urine: 1.004 — ABNORMAL LOW (ref 1.005–1.030)
UROBILINOGEN UA: 0.2 mg/dL (ref 0.0–1.0)
pH: 7 (ref 5.0–8.0)

## 2014-10-08 LAB — POC OCCULT BLOOD, ED: Fecal Occult Bld: NEGATIVE

## 2014-10-08 NOTE — ED Notes (Signed)
Pt states that since Wed last week when she had her back injury, she has been constipated.  Pt was referred here by Triage RN for xray to see if she has blockage.  Pt has eaten and drank and take colace and miralax but has had no success.

## 2014-10-08 NOTE — Telephone Encounter (Signed)
Pt is at ED now.  

## 2014-10-08 NOTE — ED Notes (Signed)
MD at bedside. 

## 2014-10-08 NOTE — Discharge Instructions (Signed)
Constipation  Constipation is when a person has fewer than three bowel movements a week, has difficulty having a bowel movement, or has stools that are dry, hard, or larger than normal. As people grow older, constipation is more common. If you try to fix constipation with medicines that make you have a bowel movement (laxatives), the problem may get worse. Long-term laxative use may cause the muscles of the colon to become weak. A low-fiber diet, not taking in enough fluids, and taking certain medicines may make constipation worse.   CAUSES    Certain medicines, such as antidepressants, pain medicine, iron supplements, antacids, and water pills.    Certain diseases, such as diabetes, irritable bowel syndrome (IBS), thyroid disease, or depression.    Not drinking enough water.    Not eating enough fiber-rich foods.    Stress or travel.    Lack of physical activity or exercise.    Ignoring the urge to have a bowel movement.    Using laxatives too much.   SIGNS AND SYMPTOMS    Having fewer than three bowel movements a week.    Straining to have a bowel movement.    Having stools that are hard, dry, or larger than normal.    Feeling full or bloated.    Pain in the lower abdomen.    Not feeling relief after having a bowel movement.   DIAGNOSIS   Your health care provider will take a medical history and perform a physical exam. Further testing may be done for severe constipation. Some tests may include:   A barium enema X-ray to examine your rectum, colon, and, sometimes, your small intestine.    A sigmoidoscopy to examine your lower colon.    A colonoscopy to examine your entire colon.  TREATMENT   Treatment will depend on the severity of your constipation and what is causing it. Some dietary treatments include drinking more fluids and eating more fiber-rich foods. Lifestyle treatments may include regular exercise. If these diet and lifestyle recommendations do not help, your health care  provider may recommend taking over-the-counter laxative medicines to help you have bowel movements. Prescription medicines may be prescribed if over-the-counter medicines do not work.   HOME CARE INSTRUCTIONS    Eat foods that have a lot of fiber, such as fruits, vegetables, whole grains, and beans.   Limit foods high in fat and processed sugars, such as french fries, hamburgers, cookies, candies, and soda.    A fiber supplement may be added to your diet if you cannot get enough fiber from foods.    Drink enough fluids to keep your urine clear or pale yellow.    Exercise regularly or as directed by your health care provider.    Go to the restroom when you have the urge to go. Do not hold it.    Only take over-the-counter or prescription medicines as directed by your health care provider. Do not take other medicines for constipation without talking to your health care provider first.   SEEK IMMEDIATE MEDICAL CARE IF:    You have bright red blood in your stool.    Your constipation lasts for more than 4 days or gets worse.    You have abdominal or rectal pain.    You have thin, pencil-like stools.    You have unexplained weight loss.  MAKE SURE YOU:    Understand these instructions.   Will watch your condition.   Will get help right away if you are not   doing well or get worse.  Document Released: 12/27/2003 Document Revised: 04/04/2013 Document Reviewed: 01/09/2013  ExitCare Patient Information 2015 [employer]. This information is not intended to replace advice given to you by your health care provider. Make sure you discuss any questions you have with your health care provider.  Abdominal Pain  Many things can cause abdominal pain. Usually, abdominal pain is not caused by a disease and will improve without treatment. It can often be observed and treated at home. Your health care provider will do a physical exam and possibly order blood tests and X-rays to help determine the seriousness  of your pain. However, in many cases, more time must pass before a clear cause of the pain can be found. Before that point, your health care provider may not know if you need more testing or further treatment.  HOME CARE INSTRUCTIONS   Monitor your abdominal pain for any changes. The following actions may help to alleviate any discomfort you are experiencing:   Only take over-the-counter or prescription medicines as directed by your health care provider.   Do not take laxatives unless directed to do so by your health care provider.   Try a clear liquid diet (broth, tea, or water) as directed by your health care provider. Slowly move to a bland diet as tolerated.  SEEK MEDICAL CARE IF:   You have unexplained abdominal pain.   You have abdominal pain associated with nausea or diarrhea.   You have pain when you urinate or have a bowel movement.   You experience abdominal pain that wakes you in the night.   You have abdominal pain that is worsened or improved by eating food.   You have abdominal pain that is worsened with eating fatty foods.   You have a fever.  SEEK IMMEDIATE MEDICAL CARE IF:    Your pain does not go away within 2 hours.   You keep throwing up (vomiting).   Your pain is felt only in portions of the abdomen, such as the right side or the left lower portion of the abdomen.   You pass bloody or black tarry stools.  MAKE SURE YOU:   Understand these instructions.    Will watch your condition.    Will get help right away if you are not doing well or get worse.   Document Released: 01/07/2005 Document Revised: 04/04/2013 Document Reviewed: 12/07/2012  ExitCare Patient Information 2015 [employer]. This information is not intended to replace advice given to you by your health care provider. Make sure you discuss any questions you have with your health care provider.
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
## 2014-10-08 NOTE — ED Provider Notes (Signed)
CSN: [id number]     Arrival date & time 10/08/14  0347 History   First MD Initiated Contact with Patient 10/08/14 1015     Chief Complaint  Patient presents with  . constipated      (Consider location/radiation/quality/duration/timing/severity/associated sxs/prior Treatment) Patient is a 81 y.o. female presenting with abdominal pain.  Abdominal Pain Pain location:  Generalized Pain quality: aching   Pain radiates to:  Does not radiate Pain severity:  Moderate Onset quality:  Gradual Duration:  1 week Timing:  Constant Progression:  Unchanged Chronicity:  New Context: recent illness (with L1 compression fracture now on norco)   Context: not alcohol use   Relieved by:  Nothing Worsened by:  Movement Associated symptoms: anorexia, constipation (last bm 5 days ago) and nausea   Associated symptoms: no diarrhea and no vomiting     Past Medical History  Diagnosis Date  . Raynaud's syndrome   . Hemochromatosis   . Thyroid disease   . IBS (irritable bowel syndrome)   . Backache, unspecified   . Hypertension   . Carcinoma of breast     Right  . Plantar fasciitis    Past Surgical History  Procedure Laterality Date  . Bone spur  1970    BOTH FEET  . Abdominal hysterectomy  1979    PARTIAL  . Mastectomy  1994    RIGHT  . Mastectomy  1994    Right  . Appendectomy    . Lumbar laminectomy  1991    L5-S1  . Cataract extraction  10/2012 and 12/2012    bilateral    Family History  Problem Relation Age of Onset  . Cancer Mother     metastatic cancer (pancreas vs lung)  . Hypertension Mother   . Osteoarthritis Mother   . Hypothyroidism Mother   . Heart disease Father     CAD/MI  . Hypertension Father   . Arthritis Father   . Hemochromatosis Father     Possible  . Diabetes Neg Hx   . Hemochromatosis Brother    History  Substance Use Topics  . Smoking status: Former Smoker    Types: Cigarettes    Quit date: 04/13/1988  . Smokeless tobacco: Not on file      Comment: approximate quit date  . Alcohol Use: Yes     Comment: 2 OZ, 5 TIMES A WEEK   OB History    No data available     Review of Systems  Gastrointestinal: Positive for nausea, abdominal pain, constipation (last bm 5 days ago) and anorexia. Negative for vomiting and diarrhea.  All other systems reviewed and are negative.     Allergies  Contrast media and Sulfonamide derivatives  Home Medications   Prior to Admission medications   Medication Sig Start Date End Date Taking? Authorizing Provider  Biotin 5000 MCG CAPS Take 1 capsule (5,000 mcg total) by mouth daily. 02/08/12  Yes Neena Rhymes, MD  fluticasone (FLONASE) 50 MCG/ACT nasal spray Place 1 spray into both nostrils daily. Patient taking differently: Place 1 spray into both nostrils daily as needed for allergies.  05/09/13  Yes Neena Rhymes, MD  HYDROcodone-acetaminophen (NORCO) 10-325 MG per tablet Take 1 tablet by mouth every 6 (six) hours as needed. Patient taking differently: Take 1 tablet by mouth every 6 (six) hours as needed for moderate pain or severe pain.  10/05/14  Yes Biagio Borg, MD  levothyroxine (SYNTHROID, LEVOTHROID) 88 MCG tablet Take 1 tablet (88 mcg total) by  mouth daily. 04/11/14  Yes Biagio Borg, MD  omeprazole (PRILOSEC) 20 MG capsule Take 20 mg by mouth as needed (heartburn).    Yes Historical Provider, MD  triamterene-hydrochlorothiazide (MAXZIDE-25) 37.5-25 MG per tablet Take 1 tablet by mouth daily. 04/11/14  Yes Biagio Borg, MD   BP 153/57 mmHg  Pulse 67  Temp(Src) 97.8 F (36.6 C) (Oral)  Resp 16  SpO2 98% Physical Exam  Constitutional: She is oriented to person, place, and time. She appears well-developed and well-nourished.  HENT:  Head: Normocephalic and atraumatic.  Right Ear: External ear normal.  Left Ear: External ear normal.  Eyes: Conjunctivae and EOM are normal. Pupils are equal, round, and reactive to light.  Neck: Normal range of motion. Neck supple.   Cardiovascular: Normal rate, regular rhythm, normal heart sounds and intact distal pulses.   Pulmonary/Chest: Effort normal and breath sounds normal.  Abdominal: Soft. Bowel sounds are normal. There is no tenderness.  Genitourinary:  No fecal impaction, light brown stool  Musculoskeletal: Normal range of motion.  Neurological: She is alert and oriented to person, place, and time.  Skin: Skin is warm and dry.  Vitals reviewed.   ED Course  Procedures (including critical care time) Labs Review Labs Reviewed  COMPREHENSIVE METABOLIC PANEL - Abnormal; Notable for the following:    Chloride 99 (*)    All other components within normal limits  CBC WITH DIFFERENTIAL/PLATELET - Abnormal; Notable for the following:    Hemoglobin 15.4 (*)    Neutrophils Relative % 78 (*)    All other components within normal limits  URINALYSIS, ROUTINE W REFLEX MICROSCOPIC (NOT AT [facility]) - Abnormal; Notable for the following:    Specific Gravity, Urine 1.004 (*)    All other components within normal limits  POC OCCULT BLOOD, ED    Imaging Review Ct Abdomen Pelvis Wo Contrast  10/08/2014   CLINICAL DATA:  Constipation, recent back injury  EXAM: CT ABDOMEN AND PELVIS WITHOUT CONTRAST  TECHNIQUE: Multidetector CT imaging of the abdomen and pelvis was performed following the standard protocol without IV contrast.  COMPARISON:  10/04/2014  FINDINGS: Lung bases are free of acute infiltrate or sizable effusion.  The liver, gallbladder, spleen, adrenal glands and pancreas are normal in their CT appearance. The kidneys are well visualized bilaterally without obstructive changes. No definitive renal or ureteral calculi are seen. Mild aortoiliac calcifications are noted without aneurysmal dilatation. The appendix is not visualized consistent with prior surgical history.  Fecal material is noted throughout the colon consistent with the given clinical history of constipation. This may be related to decrease motility from  pain medications no small bowel dilatation is noted. The bladder is well distended. No pelvic mass lesion is seen. The uterus has been surgically removed. The osseous structures again demonstrate a compression deformity L1 of a subacute nature. Some very mild stranding in the fat is noted anterior to the vertebral body consistent with subacute nature no other focal abnormality is noted.  IMPRESSION: Subacute L1 compression deformity. If the patient fails traditional medical management she may be a candidate for percutaneous vertebral augmentation.  Fecal material throughout the colon consistent with the patient's given clinical history of constipation. This may be medication related.   Electronically Signed   By: Inez Catalina M.D.   On: 10/08/2014 13:19   Mr Lumbar Spine Wo Contrast  10/08/2014   CLINICAL DATA:  L1 compression fracture. Constipation since injury 1 week ago.  EXAM: MRI LUMBAR SPINE WITHOUT CONTRAST  TECHNIQUE: Multiplanar, multisequence MR imaging of the lumbar spine was performed. No intravenous contrast was administered.  COMPARISON:  CT of the abdomen pelvis 10/08/2014  FINDINGS: A superior endplate compression fracture at L1 is confirmed. Vertebral body height is narrowed to 19 mm compared with 26 mm at the adjacent level. There is no significant retropulsion of bone.  Normal signal is present in the conus medullaris which terminates at T12-L1, within normal limits.  Heterogeneous marrow signal is present with chronic endplate marrow changes at L4-5. Vertebral body heights are otherwise maintained. A hemangioma is present at T12. Limited imaging of the abdomen is unremarkable. There is no significant adenopathy.  T12-L1:  Negative.  L1-2: A mild broad-based disc protrusion is present without significant stenosis.  L2-3: A mild broad-based disc protrusion is present. There is mild facet hypertrophy bilaterally. No significant stenosis is evident.  L3-4: A mild broad-based disc protrusion is  asymmetric to the left. There is no significant stenosis.  L4-5: Chronic loss of disc height is noted. A right laminectomy is evident. The central canal is decompressed. No significant stenosis is present.  L5-S1: Mild leftward disc bulging is present without significant stenosis.  IMPRESSION: 1. Acute/subacute superior endplate compression fracture at L1 without significant retropulsion of bone. Approximately 30% loss of height is noted. 2. Heterogeneous marrow signal with scattered fatty infiltration and endplate marrow changes without other fracture. 3. Right laminectomy at L4-5 without residual or recurrent stenosis. 4. Mild disc bulging at multiple levels without significant stenosis otherwise.   Electronically Signed   By: San Morelle M.D.   On: 10/08/2014 16:49     EKG Interpretation None      MDM   Final diagnoses:  Abdominal pain, acute  Slow transit constipation    81 y.o. female with pertinent PMH of recent L1 compression fracture, HTN, hemachromatosis presents with constipation, abd pain as above.  Physical exam as above without impaction.  CT scan and wu unremarkable.  Pt asymptomatic.  DC home with standard return precautions and informed to take 5-6 capfuls of miralax with 24 ounces fluids.    I have reviewed all laboratory and imaging studies if ordered as above  1. Abdominal pain, acute   2. Slow transit constipation         Debby Freiberg, MD 10/09/14 [phone]

## 2014-10-10 ENCOUNTER — Encounter: Payer: Self-pay | Admitting: Vascular Surgery

## 2014-10-11 ENCOUNTER — Encounter: Payer: Self-pay | Admitting: Vascular Surgery

## 2014-10-11 ENCOUNTER — Ambulatory Visit (HOSPITAL_COMMUNITY)
Admission: RE | Admit: 2014-10-11 | Discharge: 2014-10-11 | Disposition: A | Discharge status: Home / Self Care | Payer: Medicare Other | Source: Ambulatory Visit | Attending: Vascular Surgery | Admitting: Vascular Surgery

## 2014-10-11 ENCOUNTER — Ambulatory Visit (INDEPENDENT_AMBULATORY_CARE_PROVIDER_SITE_OTHER): Payer: Medicare Other | Admitting: Vascular Surgery

## 2014-10-11 VITALS — BP 168/81 | HR 65 | Ht 61.0 in | Wt 104.3 lb

## 2014-10-11 DIAGNOSIS — I83893 Varicose veins of bilateral lower extremities with other complications: Secondary | ICD-10-CM

## 2014-10-11 NOTE — Progress Notes (Signed)
[facility] [place] HISTORY AND PHYSICAL   History of Present Illness:  Patient is a 81 y.o. year old female who presents for evaluation of pain and swelling of both lower extremities.  The patient has had varicose veins for several years. She has had slow progressive worsening of pain achiness heaviness and fullness in both lower extremities. Her legs feel better in the morning time. They become heavier and more achy as the day progresses when she is on her feet all day. She also does have a history of a lumbar spine compression fracture. She also had a fracture of her left patella in December of last year. She developed an abrasion on her right leg several weeks ago and has had difficulty healing this. She has family history of varicose veins in her father and mother. She denies prior history of DVT. She has not worn compression stockings in the past.  Other medical problems include Raynaud's, hemochromatosis, irritable bowel syndrome, hypertension, plantar fasciitis, COPD all of which are currently stable.  Past Medical History  Diagnosis Date  . Raynaud's syndrome   . Hemochromatosis   . Thyroid disease   . IBS (irritable bowel syndrome)   . Backache, unspecified   . Hypertension   . Carcinoma of breast     Right  . Plantar fasciitis   . COPD (chronic obstructive pulmonary disease)     Past Surgical History  Procedure Laterality Date  . Bone spur  1970    BOTH FEET  . Abdominal hysterectomy  1979    PARTIAL  . Mastectomy  1994    RIGHT  . Mastectomy  1994    Right  . Appendectomy    . Lumbar laminectomy  1991    L5-S1  . Cataract extraction  10/2012 and 12/2012    bilateral     Social History History  Substance Use Topics  . Smoking status: Former Smoker    Types: Cigarettes    Quit date: 04/13/1988  . Smokeless tobacco: Not on file     Comment: approximate quit date  . Alcohol Use: 1.8 oz/week    3 Glasses of wine per week     Comment: 2 OZ, 5  TIMES A WEEK    Family History Family History  Problem Relation Age of Onset  . Cancer Mother     metastatic cancer (pancreas vs lung)  . Hypertension Mother   . Osteoarthritis Mother   . Hypothyroidism Mother   . Varicose Veins Mother   . Bleeding Disorder Mother   . Heart disease Father     CAD/MI  . Hypertension Father   . Arthritis Father   . Hemochromatosis Father     Possible  . Deep vein thrombosis Father   . Varicose Veins Father   . Heart attack Father   . Diabetes Neg Hx   . Hemochromatosis Brother   . Heart disease Brother   . Heart attack Brother   . Hyperlipidemia Son     Allergies  Allergies  Allergen Reactions  . Contrast Media [Iodinated Diagnostic Agents] Hives, Itching and Other (See Comments)    Felt like she was going to faint   . Sulfonamide Derivatives Other (See Comments)    Reaction: unknown      Current Outpatient Prescriptions  Medication Sig Dispense Refill  . Biotin 5000 MCG CAPS Take 1 capsule (5,000 mcg total) by mouth daily. 30 capsule   . docusate sodium (COLACE) 100 MG capsule Take 100 mg  by mouth 2 (two) times daily.    . fluticasone (FLONASE) 50 MCG/ACT nasal spray Place 1 spray into both nostrils daily. (Patient taking differently: Place 1 spray into both nostrils daily as needed for allergies. ) 48 g 3  . HYDROcodone-acetaminophen (NORCO) 10-325 MG per tablet Take 1 tablet by mouth every 6 (six) hours as needed. (Patient taking differently: Take 1 tablet by mouth every 6 (six) hours as needed for moderate pain or severe pain. ) 120 tablet 0  . levothyroxine (SYNTHROID, LEVOTHROID) 88 MCG tablet Take 1 tablet (88 mcg total) by mouth daily. 90 tablet 3  . omeprazole (PRILOSEC) 20 MG capsule Take 20 mg by mouth as needed (heartburn).     . polyethylene glycol (MIRALAX / GLYCOLAX) packet Take 17 g by mouth daily.    Marland Kitchen triamterene-hydrochlorothiazide (MAXZIDE-25) 37.5-25 MG per tablet Take 1 tablet by mouth daily. 90 tablet 3   No  current facility-administered medications for this visit.    ROS:   General:  No weight loss, Fever, chills  HEENT: No recent headaches, no nasal bleeding, no visual changes, no sore throat  Neurologic: No dizziness, blackouts, seizures. No recent symptoms of stroke or mini- stroke. No recent episodes of slurred speech, or temporary blindness.  Cardiac: No recent episodes of chest pain/pressure, no shortness of breath at rest.  No shortness of breath with exertion.  Denies history of atrial fibrillation or irregular heartbeat  Vascular: No history of rest pain in feet.  No history of claudication.  No history of non-healing ulcer, No history of DVT   Pulmonary: No home oxygen, no productive cough, no hemoptysis,  No asthma or wheezing  Musculoskeletal:  [ ]  Arthritis, [ ]  Low back pain,  [ ]  Joint pain  Hematologic:No history of hypercoagulable state.  No history of easy bleeding.  No history of anemia  Gastrointestinal: No hematochezia or melena,  No gastroesophageal reflux, no trouble swallowing  Urinary: [ ]  chronic Kidney disease, [ ]  on HD - [ ]  MWF or [ ]  TTHS, [ ]  Burning with urination, [ ]  Frequent urination, [ ]  Difficulty urinating;   Skin: No rashes  Psychological: No history of anxiety,  No history of depression   Physical Examination  Filed Vitals:   10/11/14 1104 10/11/14 1110  BP: 179/84 168/81  Pulse: 65   Height: 5\' 1"  (1.549 m)   Weight: 104 lb 4.8 oz (47.31 kg)   SpO2: 98%     Body mass index is 19.72 kg/(m^2).  General:  Alert and oriented, no acute distress HEENT: Normal Neck: No bruit or JVD Pulmonary: Clear to auscultation bilaterally Cardiac: Regular Rate and Rhythm without murmur Abdomen: Soft, non-tender, non-distended, no mass Skin: No rash, multiple areas of hemosiderin staining diffusely throughout the lower leg 3 x 2 cm ulceration right pretibial area, large clusters of varicosities on the left medial posterior calf near the popliteal  fossa vein diameter 6 mm in this area area covers about 5 cm Extremity Pulses:  2+ radial, brachial, femoral, dorsalis pedis pulses bilaterally Musculoskeletal: No deformity trace edema  Neurologic: Upper and lower extremity motor 5/5 and symmetric  DATA:  Patient had bilateral lower extremity reflux exam today. This showed reflux in the right greater saphenous vein with vein diameter 3-6 mm.  No reflux in the lesser saphenous. There was evidence of deep vein reflux on the right side.  In the left lower extremity, there was reflux in the left greater saphenous with vein diameter 3-5 mm. There  was also reflux in the left lesser saphenous approximate 5 mm diameter. She also had evidence of deep vein reflux on the left.   ASSESSMENT:  Patient with bilateral superficial and deep venous reflux. She has intermittently had ulcerations on her legs and these are sometimes slow to heal most likely due to venous hypertension. She is also symptomatic with pain and aching and swelling from her varicose veins.  PLAN:  Patient was given a prescription today for bilateral lower extremity compression stockings 20-30 mm thigh-high. She will return for follow-up in 3 months time for consideration of laser ablation.   Ruta Hinds, MD [facility] [place] Office: [phone] Pager: [phone]

## 2014-10-11 NOTE — Patient Instructions (Signed)
Varicose Veins Varicose veins are veins that have become enlarged and twisted. CAUSES This condition is the result of valves in the veins not working properly. Valves in the veins help return blood from the leg to the heart. If these valves are damaged, blood flows backwards and backs up into the veins in the leg near the skin. This causes the veins to become larger. People who are on their feet a lot, who are pregnant, or who are overweight are more likely to develop varicose veins. SYMPTOMS   Bulging, twisted-appearing, bluish veins, most commonly found on the legs.  Leg pain or a feeling of heaviness. These symptoms may be worse at the end of the day.  Leg swelling.  Skin color changes. DIAGNOSIS  Varicose veins can usually be diagnosed with an exam of your legs by your caregiver. He or she may recommend an ultrasound of your leg veins. TREATMENT  Most varicose veins can be treated at home.However, other treatments are available for people who have persistent symptoms or who want to treat the cosmetic appearance of the varicose veins. These include:  Laser treatment of very small varicose veins.  Medicine that is shot (injected) into the vein. This medicine hardens the walls of the vein and closes off the vein. This treatment is called sclerotherapy. Afterwards, you may need to wear clothing or bandages that apply pressure.  Surgery. HOME CARE INSTRUCTIONS   Do not stand or sit in one position for long periods of time. Do not sit with your legs crossed. Rest with your legs raised during the day.  Wear elastic stockings or support hose. Do not wear other tight, encircling garments around the legs, pelvis, or waist.  Walk as much as possible to increase blood flow.  Raise the foot of your bed at night with 2-inch blocks.  If you get a cut in the skin over the vein and the vein bleeds, lie down with your leg raised and press on it with a clean cloth until the bleeding stops. Then  place a bandage (dressing) on the cut. See your caregiver if it continues to bleed or needs stitches. SEEK MEDICAL CARE IF:   The skin around your ankle starts to break down.  You have pain, redness, tenderness, or hard swelling developing in your leg over a vein.  You are uncomfortable due to leg pain.    Venous Stasis or Chronic Venous Insufficiency Chronic venous insufficiency, also called venous stasis, is a condition that affects the veins in the legs. The condition prevents blood from being pumped through these veins effectively. Blood may no longer be pumped effectively from the legs back to the heart. This condition can range from mild to severe. With proper treatment, you should be able to continue with an active life. CAUSES  Chronic venous insufficiency occurs when the vein walls become stretched, weakened, or damaged or when valves within the vein are damaged. Some common causes of this include:  High blood pressure inside the veins (venous hypertension).  Increased blood pressure in the leg veins from long periods of sitting or standing.  A blood clot that blocks blood flow in a vein (deep vein thrombosis).  Inflammation of a superficial vein (phlebitis) that causes a blood clot to form. RISK FACTORS Various things can make you more likely to develop chronic venous insufficiency, including:  Family history of this condition.  Obesity.  Pregnancy.  Sedentary lifestyle.  Smoking.  Jobs requiring long periods of standing or sitting in one  place.  Being a certain age. Women in their 60s and 72s and men in their 97s are more likely to develop this condition. SIGNS AND SYMPTOMS  Symptoms may include:   Varicose veins.  Skin breakdown or ulcers.  Reddened or discolored skin on the leg.  Brown, smooth, tight, and painful skin just above the ankle, usually on the inside surface (lipodermatosclerosis).  Swelling. DIAGNOSIS  To diagnose this condition, your  health care provider will take a medical history and do a physical exam. The following tests may be ordered to confirm the diagnosis:  Duplex ultrasound--A procedure that produces a picture of a blood vessel and nearby organs and also provides information on blood flow through the blood vessel.  Plethysmography--A procedure that tests blood flow.  A venogram, or venography--A procedure used to look at the veins using X-ray and dye. TREATMENT The goals of treatment are to help you return to an active life and to minimize pain or disability. Treatment will depend on the severity of the condition. Medical procedures may be needed for severe cases. Treatment options may include:   Use of compression stockings. These can help with symptoms and lower the chances of the problem getting worse, but they do not cure the problem.  Sclerotherapy--A procedure involving an injection of a material that "dissolves" the damaged veins. Other veins in the network of blood vessels take over the function of the damaged veins.  Surgery to remove the vein or cut off blood flow through the vein (vein stripping or laser ablation surgery).  Surgery to repair a valve. HOME CARE INSTRUCTIONS   Wear compression stockings as directed by your health care provider.  Only take over-the-counter or prescription medicines for pain, discomfort, or fever as directed by your health care provider.  Follow up with your health care provider as directed. SEEK MEDICAL CARE IF:   You have redness, swelling, or increasing pain in the affected area.  You see a red streak or line that extends up or down from the affected area.  You have a breakdown or loss of skin in the affected area, even if the breakdown is small.  You have an injury to the affected area. SEEK IMMEDIATE MEDICAL CARE IF:   You have an injury and open wound in the affected area.  Your pain is severe and does not improve with medicine.  You have sudden  numbness or weakness in the foot or ankle below the affected area, or you have trouble moving your foot or ankle.  You have a fever or persistent symptoms for more than 2-3 days.  You have a fever and your symptoms suddenly get worse. MAKE SURE YOU:   Understand these instructions.  Will watch your condition.  Will get help right away if you are not doing well or get worse. D

## 2014-10-12 ENCOUNTER — Ambulatory Visit (HOSPITAL_BASED_OUTPATIENT_CLINIC_OR_DEPARTMENT_OTHER): Payer: Medicare Other

## 2014-10-12 NOTE — Patient Instructions (Signed)

## 2014-10-12 NOTE — Progress Notes (Signed)
1312 to 1319 phlebotomized 507 gm blood from L antecubital using 16 g phlebotomy set. Pt tolerated well. Snack taken and 30 minute observation completed.

## 2014-10-17 ENCOUNTER — Other Ambulatory Visit: Payer: Self-pay | Admitting: Neurosurgery

## 2014-10-17 DIAGNOSIS — I10 Essential (primary) hypertension: Secondary | ICD-10-CM | POA: Diagnosis not present

## 2014-10-17 DIAGNOSIS — S32010A Wedge compression fracture of first lumbar vertebra, initial encounter for closed fracture: Secondary | ICD-10-CM | POA: Diagnosis not present

## 2014-10-17 DIAGNOSIS — M549 Dorsalgia, unspecified: Secondary | ICD-10-CM | POA: Diagnosis not present

## 2014-10-18 ENCOUNTER — Encounter (HOSPITAL_COMMUNITY): Payer: Self-pay | Admitting: *Deleted

## 2014-10-18 NOTE — Anesthesia Preprocedure Evaluation (Addendum)
Anesthesia Evaluation  Patient identified by MRN, date of birth, ID band Patient awake    Reviewed: Allergy & Precautions, NPO status , Patient's Chart, lab work & pertinent test results, reviewed documented beta blocker date and time   History of Anesthesia Complications (+) DIFFICULT AIRWAY and history of anesthetic complications (reported difficult intubation during breast surgery,could not find record)  Airway Mallampati: III  TM Distance: >3 FB Neck ROM: Full    Dental no notable dental hx.    Pulmonary COPDformer smoker (quit 1990),  breath sounds clear to auscultation  Pulmonary exam normal       Cardiovascular hypertension, Pt. on medications + Peripheral Vascular Disease Normal cardiovascular examRhythm:Regular Rate:Normal     Neuro/Psych    GI/Hepatic Neg liver ROS, GERD-  Medicated,  Endo/Other  Hypothyroidism   Renal/GU negative Renal ROS     Musculoskeletal   Abdominal   Peds  Hematology 15/45  HX of hemochromatosis, undergoes therapeutic phlebotomy     Anesthesia Other Findings   Reproductive/Obstetrics                            Anesthesia Physical Anesthesia Plan  ASA: III  Anesthesia Plan: General   Post-op Pain Management:    Induction: Intravenous  Airway Management Planned: Oral ETT and Video Laryngoscope Planned  Additional Equipment:   Intra-op Plan:   Post-operative Plan: Extubation in OR  Informed Consent: I have reviewed the patients History and Physical, chart, labs and discussed the procedure including the risks, benefits and alternatives for the proposed anesthesia with the patient or authorized representative who has indicated his/her understanding and acceptance.     Plan Discussed with:   Anesthesia Plan Comments: (Possible difficult intubation, have Glide available)        Anesthesia Quick Evaluation

## 2014-10-18 NOTE — Progress Notes (Signed)
Pt states she has been told to never have Sodium Pentothal given to her for anesthesia. States her mother had a severe reaction and she could also.

## 2014-10-19 ENCOUNTER — Ambulatory Visit (HOSPITAL_COMMUNITY): Payer: Medicare Other | Admitting: Anesthesiology

## 2014-10-19 ENCOUNTER — Encounter (HOSPITAL_COMMUNITY): Payer: Self-pay | Admitting: *Deleted

## 2014-10-19 ENCOUNTER — Ambulatory Visit: Payer: Medicare Other | Admitting: Internal Medicine

## 2014-10-19 ENCOUNTER — Encounter (HOSPITAL_COMMUNITY)
Admission: RE | Disposition: A | Discharge status: Home / Self Care | Payer: Self-pay | Source: Ambulatory Visit | Attending: Neurosurgery

## 2014-10-19 ENCOUNTER — Ambulatory Visit (HOSPITAL_COMMUNITY): Payer: Medicare Other

## 2014-10-19 ENCOUNTER — Observation Stay (HOSPITAL_COMMUNITY)
Admission: RE | Admit: 2014-10-19 | Discharge: 2014-10-20 | Disposition: A | Discharge status: Home / Self Care | Payer: Medicare Other | Source: Ambulatory Visit | Attending: Neurosurgery | Admitting: Neurosurgery

## 2014-10-19 DIAGNOSIS — IMO0002 Reserved for concepts with insufficient information to code with codable children: Secondary | ICD-10-CM

## 2014-10-19 DIAGNOSIS — Z853 Personal history of malignant neoplasm of breast: Secondary | ICD-10-CM | POA: Diagnosis not present

## 2014-10-19 DIAGNOSIS — Z87891 Personal history of nicotine dependence: Secondary | ICD-10-CM | POA: Insufficient documentation

## 2014-10-19 DIAGNOSIS — K219 Gastro-esophageal reflux disease without esophagitis: Secondary | ICD-10-CM | POA: Diagnosis not present

## 2014-10-19 DIAGNOSIS — Z9889 Other specified postprocedural states: Secondary | ICD-10-CM | POA: Diagnosis not present

## 2014-10-19 DIAGNOSIS — X58XXXA Exposure to other specified factors, initial encounter: Secondary | ICD-10-CM | POA: Insufficient documentation

## 2014-10-19 DIAGNOSIS — E119 Type 2 diabetes mellitus without complications: Secondary | ICD-10-CM | POA: Diagnosis not present

## 2014-10-19 DIAGNOSIS — I10 Essential (primary) hypertension: Secondary | ICD-10-CM | POA: Diagnosis not present

## 2014-10-19 DIAGNOSIS — K589 Irritable bowel syndrome without diarrhea: Secondary | ICD-10-CM | POA: Diagnosis not present

## 2014-10-19 DIAGNOSIS — S32019A Unspecified fracture of first lumbar vertebra, initial encounter for closed fracture: Principal | ICD-10-CM | POA: Insufficient documentation

## 2014-10-19 DIAGNOSIS — S32010A Wedge compression fracture of first lumbar vertebra, initial encounter for closed fracture: Secondary | ICD-10-CM | POA: Diagnosis present

## 2014-10-19 DIAGNOSIS — J449 Chronic obstructive pulmonary disease, unspecified: Secondary | ICD-10-CM | POA: Insufficient documentation

## 2014-10-19 DIAGNOSIS — E039 Hypothyroidism, unspecified: Secondary | ICD-10-CM | POA: Diagnosis not present

## 2014-10-19 HISTORY — DX: Hypothyroidism, unspecified: E03.9

## 2014-10-19 HISTORY — DX: Pneumonia, unspecified organism: J18.9

## 2014-10-19 HISTORY — DX: Personal history of other infectious and parasitic diseases: Z86.19

## 2014-10-19 HISTORY — PX: KYPHOPLASTY: SHX5884

## 2014-10-19 HISTORY — DX: Irritable bowel syndrome, unspecified: K58.9

## 2014-10-19 HISTORY — DX: Family history of other specified conditions: Z84.89

## 2014-10-19 HISTORY — DX: Other complications of anesthesia, initial encounter: T88.59XA

## 2014-10-19 HISTORY — DX: Adverse effect of unspecified anesthetic, initial encounter: T41.45XA

## 2014-10-19 HISTORY — DX: Gastro-esophageal reflux disease without esophagitis: K21.9

## 2014-10-19 HISTORY — DX: Asymptomatic varicose veins of unspecified lower extremity: I83.90

## 2014-10-19 LAB — SURGICAL PCR SCREEN
MRSA, PCR: NEGATIVE
Staphylococcus aureus: POSITIVE — AB

## 2014-10-19 SURGERY — KYPHOPLASTY
Anesthesia: General | Site: Spine Lumbar

## 2014-10-19 MED ORDER — NEOSTIGMINE METHYLSULFATE 10 MG/10ML IV SOLN
INTRAVENOUS | Status: DC | PRN
  Administered 2014-10-19: 4 mg via INTRAVENOUS

## 2014-10-19 MED ORDER — BUPIVACAINE HCL (PF) 0.25 % IJ SOLN
INTRAMUSCULAR | Status: DC | PRN
  Administered 2014-10-19: 4.5 mL

## 2014-10-19 MED ORDER — FENTANYL CITRATE (PF) 100 MCG/2ML IJ SOLN
25.0000 ug | INTRAMUSCULAR | Status: DC | PRN
  Administered 2014-10-19: 50 ug via INTRAVENOUS

## 2014-10-19 MED ORDER — BISACODYL 10 MG RE SUPP: 10.0000 mg | Freq: Every day | RECTAL | Status: DC | PRN

## 2014-10-19 MED ORDER — PHENYLEPHRINE 40 MCG/ML (10ML) SYRINGE FOR IV PUSH (FOR BLOOD PRESSURE SUPPORT)
PREFILLED_SYRINGE | INTRAVENOUS | Status: AC
  Filled 2014-10-19: qty 10

## 2014-10-19 MED ORDER — LEVOTHYROXINE SODIUM 88 MCG PO TABS
88.0000 ug | ORAL_TABLET | Freq: Every day | ORAL | Status: DC
  Administered 2014-10-20: 88 ug via ORAL
  Filled 2014-10-19 (×2): qty 1

## 2014-10-19 MED ORDER — FENTANYL CITRATE (PF) 250 MCG/5ML IJ SOLN
INTRAMUSCULAR | Status: AC
  Filled 2014-10-19: qty 5

## 2014-10-19 MED ORDER — FENTANYL CITRATE (PF) 100 MCG/2ML IJ SOLN
INTRAMUSCULAR | Status: AC
  Administered 2014-10-19: 50 ug via INTRAVENOUS
  Filled 2014-10-19: qty 2

## 2014-10-19 MED ORDER — ACETAMINOPHEN 325 MG PO TABS: 650.0000 mg | ORAL_TABLET | ORAL | Status: DC | PRN

## 2014-10-19 MED ORDER — GLYCOPYRROLATE 0.2 MG/ML IJ SOLN
INTRAMUSCULAR | Status: DC | PRN
  Administered 2014-10-19: .7 mg via INTRAVENOUS

## 2014-10-19 MED ORDER — PHENYLEPHRINE HCL 10 MG/ML IJ SOLN
INTRAMUSCULAR | Status: DC | PRN
  Administered 2014-10-19 (×2): 80 ug via INTRAVENOUS

## 2014-10-19 MED ORDER — ROCURONIUM BROMIDE 100 MG/10ML IV SOLN
INTRAVENOUS | Status: DC | PRN
  Administered 2014-10-19: 30 mg via INTRAVENOUS

## 2014-10-19 MED ORDER — MEPERIDINE HCL 25 MG/ML IJ SOLN: 6.2500 mg | INTRAMUSCULAR | Status: DC | PRN

## 2014-10-19 MED ORDER — SODIUM CHLORIDE 0.9 % IV SOLN: 250.0000 mL | INTRAVENOUS | Status: DC

## 2014-10-19 MED ORDER — ONDANSETRON HCL 4 MG PO TABS: 4.0000 mg | ORAL_TABLET | Freq: Four times a day (QID) | ORAL | Status: DC | PRN

## 2014-10-19 MED ORDER — KCL IN DEXTROSE-NACL 20-5-0.45 MEQ/L-%-% IV SOLN
INTRAVENOUS | Status: DC
  Administered 2014-10-19: 100 mL/h via INTRAVENOUS
  Filled 2014-10-19 (×3): qty 1000

## 2014-10-19 MED ORDER — PHENOL 1.4 % MT LIQD
1.0000 | OROMUCOSAL | Status: DC | PRN
Start: 2014-10-19 — End: 2014-10-20

## 2014-10-19 MED ORDER — SODIUM CHLORIDE 0.9 % IJ SOLN: 3.0000 mL | Freq: Two times a day (BID) | INTRAMUSCULAR | Status: DC

## 2014-10-19 MED ORDER — ALUM & MAG HYDROXIDE-SIMETH 200-200-20 MG/5ML PO SUSP: 30.0000 mL | Freq: Four times a day (QID) | ORAL | Status: DC | PRN

## 2014-10-19 MED ORDER — CYCLOBENZAPRINE HCL 10 MG PO TABS
ORAL_TABLET | ORAL | Status: AC
  Filled 2014-10-19: qty 1

## 2014-10-19 MED ORDER — KETOROLAC TROMETHAMINE 15 MG/ML IJ SOLN
INTRAMUSCULAR | Status: AC
  Administered 2014-10-19: 15 mg
  Filled 2014-10-19: qty 1

## 2014-10-19 MED ORDER — KCL IN DEXTROSE-NACL 20-5-0.45 MEQ/L-%-% IV SOLN
INTRAVENOUS | Status: AC
  Filled 2014-10-19: qty 1000

## 2014-10-19 MED ORDER — HYDROCODONE-ACETAMINOPHEN 5-325 MG PO TABS
1.0000 | ORAL_TABLET | ORAL | Status: DC | PRN
  Administered 2014-10-19: 1 via ORAL

## 2014-10-19 MED ORDER — SODIUM CHLORIDE 0.9 % IJ SOLN: 3.0000 mL | INTRAMUSCULAR | Status: DC | PRN

## 2014-10-19 MED ORDER — SUCCINYLCHOLINE CHLORIDE 200 MG/10ML IV SOSY
PREFILLED_SYRINGE | INTRAVENOUS | Status: DC | PRN
Start: 2014-10-19 — End: 2014-10-19
  Administered 2014-10-19: 80 mg via INTRAVENOUS

## 2014-10-19 MED ORDER — PROPOFOL 10 MG/ML IV BOLUS
INTRAVENOUS | Status: DC | PRN
Start: 2014-10-19 — End: 2014-10-19
  Administered 2014-10-19: 100 mg via INTRAVENOUS

## 2014-10-19 MED ORDER — ONDANSETRON HCL 4 MG/2ML IJ SOLN: 4.0000 mg | Freq: Four times a day (QID) | INTRAMUSCULAR | Status: DC | PRN

## 2014-10-19 MED ORDER — PROMETHAZINE HCL 25 MG/ML IJ SOLN: 6.2500 mg | INTRAMUSCULAR | Status: DC | PRN

## 2014-10-19 MED ORDER — ACETAMINOPHEN 10 MG/ML IV SOLN
INTRAVENOUS | Status: AC
  Administered 2014-10-19: 1000 mg via INTRAVENOUS
  Filled 2014-10-19: qty 100

## 2014-10-19 MED ORDER — MUPIROCIN 2 % EX OINT
TOPICAL_OINTMENT | CUTANEOUS | Status: AC
  Administered 2014-10-19: 1 via TOPICAL
  Filled 2014-10-19: qty 22

## 2014-10-19 MED ORDER — HYDROCODONE-ACETAMINOPHEN 5-325 MG PO TABS
ORAL_TABLET | ORAL | Status: AC
  Filled 2014-10-19: qty 1

## 2014-10-19 MED ORDER — SODIUM CHLORIDE 0.9 % IV SOLN: 8.0000 mg | Freq: Four times a day (QID) | INTRAVENOUS | Status: DC | PRN

## 2014-10-19 MED ORDER — HYDROXYZINE HCL 25 MG PO TABS: 50.0000 mg | ORAL_TABLET | ORAL | Status: DC | PRN

## 2014-10-19 MED ORDER — LACTATED RINGERS IV SOLN
INTRAVENOUS | Status: DC
  Administered 2014-10-19 (×2): via INTRAVENOUS

## 2014-10-19 MED ORDER — ACETAMINOPHEN 650 MG RE SUPP: 650.0000 mg | RECTAL | Status: DC | PRN

## 2014-10-19 MED ORDER — MORPHINE SULFATE 4 MG/ML IJ SOLN: 4.0000 mg | INTRAMUSCULAR | Status: DC | PRN

## 2014-10-19 MED ORDER — MUPIROCIN 2 % EX OINT
1.0000 "application " | TOPICAL_OINTMENT | Freq: Two times a day (BID) | CUTANEOUS | Status: DC
  Administered 2014-10-19: 1 via NASAL

## 2014-10-19 MED ORDER — MAGNESIUM HYDROXIDE 400 MG/5ML PO SUSP: 30.0000 mL | Freq: Every day | ORAL | Status: DC | PRN

## 2014-10-19 MED ORDER — CEFAZOLIN SODIUM-DEXTROSE 2-3 GM-% IV SOLR
2.0000 g | INTRAVENOUS | Status: AC
  Administered 2014-10-19: 2 g via INTRAVENOUS

## 2014-10-19 MED ORDER — CEFAZOLIN SODIUM-DEXTROSE 2-3 GM-% IV SOLR
INTRAVENOUS | Status: AC
  Filled 2014-10-19: qty 50

## 2014-10-19 MED ORDER — MENTHOL 3 MG MT LOZG
1.0000 | LOZENGE | OROMUCOSAL | Status: DC | PRN
Start: 2014-10-19 — End: 2014-10-20

## 2014-10-19 MED ORDER — LIDOCAINE HCL (CARDIAC) 20 MG/ML IV SOLN
INTRAVENOUS | Status: DC | PRN
  Administered 2014-10-19: 50 mg via INTRAVENOUS

## 2014-10-19 MED ORDER — PROPOFOL 10 MG/ML IV BOLUS
INTRAVENOUS | Status: AC
  Filled 2014-10-19: qty 20

## 2014-10-19 MED ORDER — KETOROLAC TROMETHAMINE 30 MG/ML IJ SOLN: 15.0000 mg | Freq: Once | INTRAMUSCULAR | Status: DC

## 2014-10-19 MED ORDER — OXYCODONE-ACETAMINOPHEN 5-325 MG PO TABS: 1.0000 | ORAL_TABLET | ORAL | Status: DC | PRN

## 2014-10-19 MED ORDER — ONDANSETRON HCL 4 MG/2ML IJ SOLN
INTRAMUSCULAR | Status: DC | PRN
  Administered 2014-10-19: 4 mg via INTRAVENOUS

## 2014-10-19 MED ORDER — LIDOCAINE-EPINEPHRINE 1 %-1:100000 IJ SOLN
INTRAMUSCULAR | Status: DC | PRN
Start: 2014-10-19 — End: 2014-10-19
  Administered 2014-10-19: 4.5 mL

## 2014-10-19 MED ORDER — KETOROLAC TROMETHAMINE 30 MG/ML IJ SOLN
15.0000 mg | Freq: Four times a day (QID) | INTRAMUSCULAR | Status: DC
  Administered 2014-10-19 – 2014-10-20 (×2): 15 mg via INTRAVENOUS
  Filled 2014-10-19 (×6): qty 1

## 2014-10-19 MED ORDER — TRIAMTERENE-HCTZ 37.5-25 MG PO TABS
1.0000 | ORAL_TABLET | Freq: Every day | ORAL | Status: DC
  Administered 2014-10-19: 1 via ORAL
  Filled 2014-10-19 (×2): qty 1

## 2014-10-19 MED ORDER — MUPIROCIN 2 % EX OINT
1.0000 "application " | TOPICAL_OINTMENT | Freq: Once | CUTANEOUS | Status: AC
  Administered 2014-10-19: 1 via TOPICAL

## 2014-10-19 MED ORDER — CYCLOBENZAPRINE HCL 10 MG PO TABS
10.0000 mg | ORAL_TABLET | Freq: Three times a day (TID) | ORAL | Status: DC | PRN
  Administered 2014-10-19: 10 mg via ORAL

## 2014-10-19 MED ORDER — 0.9 % SODIUM CHLORIDE (POUR BTL) OPTIME
TOPICAL | Status: DC | PRN
  Administered 2014-10-19: 1000 mL

## 2014-10-19 MED ORDER — HYDROXYZINE HCL 50 MG/ML IM SOLN
50.0000 mg | INTRAMUSCULAR | Status: DC | PRN
  Filled 2014-10-19: qty 1

## 2014-10-19 SURGICAL SUPPLY — 43 items
ADH SKN CLS APL DERMABOND .7 (GAUZE/BANDAGES/DRESSINGS) ×1
BANDAGE ADH SHEER 1  50/CT (GAUZE/BANDAGES/DRESSINGS) ×4 IMPLANT
BLADE CLIPPER SURG (BLADE) IMPLANT
BLADE SURG 11 STRL SS (BLADE) ×2 IMPLANT
CEMENT BONE KYPHX HV R (Orthopedic Implant) ×1 IMPLANT
CEMENT KYPHON C01A KIT/MIXER (Cement) ×1 IMPLANT
CONT SPEC 4OZ CLIKSEAL STRL BL (MISCELLANEOUS) ×3 IMPLANT
DECANTER SPIKE VIAL GLASS SM (MISCELLANEOUS) ×1 IMPLANT
DERMABOND ADVANCED (GAUZE/BANDAGES/DRESSINGS) ×1
DERMABOND ADVANCED .7 DNX12 (GAUZE/BANDAGES/DRESSINGS) IMPLANT
DRAPE C-ARM 42X72 X-RAY (DRAPES) ×2 IMPLANT
DRAPE INCISE IOBAN 66X45 STRL (DRAPES) ×2 IMPLANT
DRAPE LAPAROTOMY 100X72X124 (DRAPES) ×2 IMPLANT
DRAPE PROXIMA HALF (DRAPES) ×2 IMPLANT
DURAPREP 26ML APPLICATOR (WOUND CARE) ×2 IMPLANT
GAUZE SPONGE 4X4 16PLY XRAY LF (GAUZE/BANDAGES/DRESSINGS) ×2 IMPLANT
GLOVE BIOGEL PI IND STRL 8 (GLOVE) ×1 IMPLANT
GLOVE BIOGEL PI INDICATOR 8 (GLOVE) ×2
GLOVE ECLIPSE 7.5 STRL STRAW (GLOVE) ×3 IMPLANT
GLOVE EXAM NITRILE LRG STRL (GLOVE) IMPLANT
GLOVE EXAM NITRILE MD LF STRL (GLOVE) IMPLANT
GLOVE EXAM NITRILE XL STR (GLOVE) IMPLANT
GLOVE EXAM NITRILE XS STR PU (GLOVE) IMPLANT
GOWN STRL REUS W/ TWL LRG LVL3 (GOWN DISPOSABLE) IMPLANT
GOWN STRL REUS W/ TWL XL LVL3 (GOWN DISPOSABLE) IMPLANT
GOWN STRL REUS W/TWL 2XL LVL3 (GOWN DISPOSABLE) IMPLANT
GOWN STRL REUS W/TWL LRG LVL3 (GOWN DISPOSABLE) ×2
GOWN STRL REUS W/TWL XL LVL3 (GOWN DISPOSABLE) ×2
KIT BASIN OR (CUSTOM PROCEDURE TRAY) ×2 IMPLANT
KIT ROOM TURNOVER OR (KITS) ×2 IMPLANT
NDL HYPO 25X1 1.5 SAFETY (NEEDLE) ×1 IMPLANT
NEEDLE HYPO 25X1 1.5 SAFETY (NEEDLE) ×2 IMPLANT
NS IRRIG 1000ML POUR BTL (IV SOLUTION) ×2 IMPLANT
PACK SURGICAL SETUP 50X90 (CUSTOM PROCEDURE TRAY) ×2 IMPLANT
PAD ARMBOARD 7.5X6 YLW CONV (MISCELLANEOUS) ×7 IMPLANT
SPECIMEN JAR SMALL (MISCELLANEOUS) IMPLANT
SUT VIC AB 3-0 SH 8-18 (SUTURE) ×2 IMPLANT
SUT VIC AB 4-0 P-3 18X BRD (SUTURE) ×1 IMPLANT
SUT VIC AB 4-0 P3 18 (SUTURE) ×2
SYR CONTROL 10ML LL (SYRINGE) ×4 IMPLANT
TOWEL OR 17X24 6PK STRL BLUE (TOWEL DISPOSABLE) ×2 IMPLANT
TOWEL OR 17X26 10 PK STRL BLUE (TOWEL DISPOSABLE) ×2 IMPLANT
TRAY KYPHOPAK 20/3 ONESTEP 1ST (MISCELLANEOUS) ×1 IMPLANT

## 2014-10-19 NOTE — Anesthesia Postprocedure Evaluation (Signed)
Anesthesia Post Note  Patient: Brandy Peterson  Procedure(s) Performed: Procedure(s) (LRB): Lumbar 1 kyphoplasty (N/A)  Anesthesia type: General  Patient location: PACU  Post pain: Pain level controlled  Post assessment: Post-op Vital signs reviewed  Last Vitals: BP 145/58 mmHg  Pulse 61  Temp(Src) 36.4 C (Oral)  Resp 13  Ht 5\' 1"  (1.549 m)  Wt 104 lb 4.8 oz (47.31 kg)  BMI 19.72 kg/m2  SpO2 100%  Post vital signs: Reviewed  Level of consciousness: sedated  Complications: No apparent anesthesia complications

## 2014-10-19 NOTE — Op Note (Signed)
10/19/2014  6:08 PM  PATIENT:  Brandy Peterson  81 y.o. female  PRE-OPERATIVE DIAGNOSIS:  L1 compression fracture  POST-OPERATIVE DIAGNOSIS:  L1 compression fracture  PROCEDURE:  Procedure(s):  Lumbar 1 kyphoplasty  SURGEON:  Surgeon(s): Jovita Gamma, MD  ANESTHESIA:   general  EBL:  nil  BLOOD ADMINISTERED:none  COUNT: Correct per nursing staff  DICTATION: Patient was brought to the operating room, placed under general endotracheal anesthesia. AP and lateral C-arm fluoroscopy units were set up, and the L1 vertebra identified. The thoracolumbar region posteriorly was prepped with DuraPrep, and draped in a sterile fashion. The C-arm fluoroscopy units were also draped in a sterile fashion. The entry points to approach the posterior lateral aspect of the L1 pedicles were identified bilaterally.  The skin and subcutaneous tissue after the local anesthetic with epinephrine. 3 mm incisions were made on either side at the entry points. The cannulated trocar was passed down to the posterior lateral entry point of the pedicles, on each side. Each trocar was passed through the pedicle with C-arm fluoroscopic guidance into the vertebral body on either side. We then removed the inner trocar, leaving the outer cannula. Using the drill we created a passage through the vertebral body, to the ventral wall. The balloon expanders were placed in the vertebral body on either side, and gently inflated and expanded under C-arm fluoroscopic guidance. Once good compaction was achieved bilaterally, the balloon expanders were deflated, and we began to fill the cavity that had been created with methylmethacrylate. We filled from one side to the other, using C-arm fluoroscopic guidance, until good filling of the cavity, and good interdigitation with the bone was achieved bilaterally. A total of 4.5 cc of methylmethacrylate was introduced on either side, for a total of 9 cc. We then carefully removed each of the cannulas,  using C-arm for guidance. Final imaging showed good filling of the cavity, with no extravasation. Each of the incisions was closed with a single 3-0 undyed Vicryl subcuticular suture, and Dermabond. Following surgery the patient was turned back to a supine position, to be reversed and the anesthetic, extubated, and transferred to the recovery room for further care.   PLAN OF CARE: Admit for overnight observation  PATIENT DISPOSITION:  PACU - hemodynamically stable.   Delay start of Pharmacological VTE agent (>24hrs) due to surgical blood loss or risk of bleeding:  yes

## 2014-10-19 NOTE — H&P (Signed)
Subjective: Patient is a 81 y.o. right-handed white female who is admitted for treatment of L1 compression fracture.  Patient injured herself over 2 weeks ago. She was found to have an L1 compression fracture. X-rays show progressive loss of height of the fracture with a minimal kyphosis. MRI and CT show evidence of acute L1 compression fracture. Patient continues to have pain around her low back bilaterally with occasional pain into the right posterior lateral thigh. She is admitted now for an L1 kyphoplasty.   Patient Active Problem List   Diagnosis Date Noted  . Compression fracture of lumbar vertebra 10/05/2014  . Lower back pain 10/05/2014  . Constipation 10/05/2014  . Osteoporosis 10/05/2014  . Varicose veins of both lower extremities without ulcer or inflammation 04/11/2014  . Routine health maintenance 05/02/2013  . Thoracic back pain 02/08/2012  . Allergic rhinitis 12/29/2011  . ABDOMINAL PAIN OTHER SPECIFIED SITE 12/24/2009  . ALOPECIA 02/18/2009  . SYNCOPE 06/28/2007  . CARCINOMA, BREAST, RIGHT 01/06/2007  . Hypothyroidism 01/06/2007  . Disorder of iron metabolism 01/06/2007  . Essential hypertension 01/06/2007  . IBS 01/06/2007  . BACK PAIN 01/06/2007  . MASTECTOMY, RIGHT, HX OF 01/06/2007  . TOTAL ABDOMINAL HYSTERECTOMY, HX OF 01/06/2007  . Hemochromatosis 01/06/2007   Past Medical History  Diagnosis Date  . Raynaud's syndrome   . Hemochromatosis   . Thyroid disease   . IBS (irritable bowel syndrome)   . Backache, unspecified   . Hypertension   . Carcinoma of breast     Right  . Plantar fasciitis   . COPD (chronic obstructive pulmonary disease)   . Varicose veins   . Pneumonia   . Hypothyroidism   . GERD (gastroesophageal reflux disease)   . Complication of anesthesia     was told to never use Sodium Pentothal - mother had reaction. Also had trouble with intubation during mastectomy  . Family history of adverse reaction to anesthesia   . History of shingles    . Spastic colon     hx of     Past Surgical History  Procedure Laterality Date  . Bone spur  1970    BOTH FEET  . Abdominal hysterectomy  1979    PARTIAL  . Mastectomy  1994    RIGHT  . Mastectomy  1994    Right  . Appendectomy    . Lumbar laminectomy  1991    L5-S1  . Cataract extraction  10/2012 and 12/2012    bilateral with lens implants  . Colonoscopy      Prescriptions prior to admission  Medication Sig Dispense Refill Last Dose  . Biotin 5000 MCG CAPS Take 1 capsule (5,000 mcg total) by mouth daily. 30 capsule  10/18/2014 at Unknown time  . docusate sodium (COLACE) 100 MG capsule Take 100 mg by mouth 2 (two) times daily.   10/18/2014 at Unknown time  . fluticasone (FLONASE) 50 MCG/ACT nasal spray Place 1 spray into both nostrils daily. (Patient taking differently: Place 1 spray into both nostrils daily as needed for allergies. ) 48 g 3 10/18/2014 at Unknown time  . HYDROcodone-acetaminophen (NORCO) 10-325 MG per tablet Take 1 tablet by mouth every 6 (six) hours as needed. (Patient taking differently: Take 1 tablet by mouth every 6 (six) hours as needed for moderate pain or severe pain. ) 120 tablet 0 10/18/2014 at Unknown time  . levothyroxine (SYNTHROID, LEVOTHROID) 88 MCG tablet Take 1 tablet (88 mcg total) by mouth daily. 90 tablet 3 10/19/2014 at 0730  .  omeprazole (PRILOSEC) 20 MG capsule Take 20 mg by mouth daily as needed (heartburn).    Past Week at Unknown time  . Polyethyl Glycol-Propyl Glycol (SYSTANE OP) Place 1 drop into both eyes as needed (for dry eyes).   10/18/2014 at Unknown time  . polyethylene glycol (MIRALAX / GLYCOLAX) packet Take 17 g by mouth daily.   10/18/2014 at Unknown time  . triamterene-hydrochlorothiazide (MAXZIDE-25) 37.5-25 MG per tablet Take 1 tablet by mouth daily. 90 tablet 3 10/18/2014 at 2200   Allergies  Allergen Reactions  . Contrast Media [Iodinated Diagnostic Agents] Hives, Itching and Other (See Comments)    Felt like she was going to faint   .  Adhesive [Tape] Other (See Comments)    Prefers paper tape  . Sulfonamide Derivatives Other (See Comments)    Reaction: unknown     History  Substance Use Topics  . Smoking status: Former Smoker    Types: Cigarettes    Quit date: 04/13/1988  . Smokeless tobacco: Never Used     Comment: approximate quit date  . Alcohol Use: 1.8 oz/week    3 Glasses of wine per week     Comment: 2 OZ, 5 TIMES A WEEK    Family History  Problem Relation Age of Onset  . Cancer Mother     metastatic cancer (pancreas vs lung)  . Hypertension Mother   . Osteoarthritis Mother   . Hypothyroidism Mother   . Varicose Veins Mother   . Bleeding Disorder Mother   . Heart disease Father     CAD/MI  . Hypertension Father   . Arthritis Father   . Hemochromatosis Father     Possible  . Deep vein thrombosis Father   . Varicose Veins Father   . Heart attack Father   . Diabetes Neg Hx   . Hemochromatosis Brother   . Heart disease Brother   . Heart attack Brother   . Hyperlipidemia Son      Review of Systems A comprehensive review of systems was negative.  Objective: Vital signs in last 24 hours: Temp:  [97.8 F (36.6 C)] 97.8 F (36.6 C) (07/08 1432) Pulse Rate:  [77] 77 (07/08 1432) Resp:  [18] 18 (07/08 1432) BP: (151)/(63) 151/63 mmHg (07/08 1432) SpO2:  [99 %] 99 % (07/08 1432) Weight:  [47.31 kg (104 lb 4.8 oz)] 47.31 kg (104 lb 4.8 oz) (07/08 1432)  EXAM: Patient well-developed well-nourished white female in no acute distress. Lungs are clear to auscultation , the patient has symmetrical respiratory excursion. Heart has a regular rate and rhythm normal S1 and S2 no murmur.   Abdomen is soft nontender nondistended bowel sounds are present. Extremity examination shows no clubbing cyanosis or edema. Motor examination shows 5 over 5 strength in the lower extremities including the iliopsoas quadriceps dorsiflexor extensor hallicus  longus and plantar flexor bilaterally. Sensation is intact to  pinprick in the distal lower extremities. Reflexes are symmetrical bilaterally. No pathologic reflexes are present. Patient has a normal gait and stance.   Data Review:CBC    Component Value Date/Time   WBC 8.8 10/08/2014 1100   WBC 9.2 10/04/2014 1458   RBC 4.91 10/08/2014 1100   RBC 4.86 10/04/2014 1458   HGB 15.4* 10/08/2014 1100   HGB 15.4 10/04/2014 1458   HCT 44.5 10/08/2014 1100   HCT 45.1 10/04/2014 1458   PLT 243 10/08/2014 1100   PLT 238 10/04/2014 1458   MCV 90.6 10/08/2014 1100   MCV 92.8 10/04/2014 1458  MCH 31.4 10/08/2014 1100   MCH 31.7 10/04/2014 1458   MCHC 34.6 10/08/2014 1100   MCHC 34.2 10/04/2014 1458   RDW 12.0 10/08/2014 1100   RDW 12.2 10/04/2014 1458   LYMPHSABS 1.1 10/08/2014 1100   LYMPHSABS 1.0 10/04/2014 1458   MONOABS 0.7 10/08/2014 1100   MONOABS 0.8 10/04/2014 1458   EOSABS 0.2 10/08/2014 1100   EOSABS 0.2 10/04/2014 1458   BASOSABS 0.0 10/08/2014 1100   BASOSABS 0.1 10/04/2014 1458                          BMET    Component Value Date/Time   NA 139 10/08/2014 1100   NA 138 10/04/2014 1458   K 3.7 10/08/2014 1100   K 4.2 10/04/2014 1458   CL 99* 10/08/2014 1100   CL 100 05/13/2012 1037   CO2 29 10/08/2014 1100   CO2 30* 10/04/2014 1458   GLUCOSE 88 10/08/2014 1100   GLUCOSE 115 10/04/2014 1458   GLUCOSE 88 05/13/2012 1037   BUN 13 10/08/2014 1100   BUN 12.5 10/04/2014 1458   CREATININE 0.72 10/08/2014 1100   CREATININE 0.8 10/04/2014 1458   CALCIUM 9.3 10/08/2014 1100   CALCIUM 9.3 10/04/2014 1458   GFRNONAA >60 10/08/2014 1100   GFRAA >60 10/08/2014 1100     Assessment/Plan: L1 compression fracture with progressive loss of vertebral body height and persistent disabling pain who is admitted now for a L1 kyphoplasty. I've discussed with the patient the nature of his condition, the nature the surgical procedure, the typical length of surgery, hospital stay, and overall recuperation. We discussed limitations postoperatively. I  discussed risks of surgery including risks of infection, bleeding, possibly need for transfusion, the risk of nerve root dysfunction with pain, weakness, numbness, or paresthesias, the risk of dural tear and CSF leakage and possible need for further surgery, and the risk of anesthetic complications including myocardial infarction, stroke, pneumonia, and death. Understanding all this the patient does wish to proceed with surgery and is admitted for such.    Hosie Spangle, MD 10/19/2014 4:44 PM

## 2014-10-19 NOTE — Anesthesia Procedure Notes (Signed)
Procedure Name: Intubation Date/Time: 10/19/2014 4:51 PM Performed by: Manus Gunning, Dinari Stgermaine J Pre-anesthesia Checklist: Patient identified, Timeout performed, Emergency Drugs available, Suction available and Patient being monitored Patient Re-evaluated:Patient Re-evaluated prior to inductionOxygen Delivery Method: Circle system utilized Preoxygenation: Pre-oxygenation with 100% oxygen Intubation Type: IV induction Ventilation: Mask ventilation without difficulty Laryngoscope Size: Mac and 3 Grade View: Grade II Tube type: Oral Tube size: 6.5 mm Number of attempts: 1 Placement Confirmation: ETT inserted through vocal cords under direct vision,  positive ETCO2 and breath sounds checked- equal and bilateral Secured at: 21 cm Tube secured with: Tape Dental Injury: Teeth and Oropharynx as per pre-operative assessment

## 2014-10-19 NOTE — Transfer of Care (Signed)
Immediate Anesthesia Transfer of Care Note  Patient: NOHEALANI MEDINGER  Procedure(s) Performed: Procedure(s): Lumbar 1 kyphoplasty (N/A)  Patient Location: PACU  Anesthesia Type:General  Level of Consciousness: awake, alert  and oriented  Airway & Oxygen Therapy: Patient Spontanous Breathing and Patient connected to nasal cannula oxygen  Post-op Assessment: Report given to RN, Post -op Vital signs reviewed and stable and Patient moving all extremities X 4  Post vital signs: Reviewed and stable  Last Vitals:  Filed Vitals:   10/19/14 1432  BP: 151/63  Pulse: 77  Temp: 36.6 C  Resp: 18    Complications: No apparent anesthesia complications

## 2014-10-20 DIAGNOSIS — S32019A Unspecified fracture of first lumbar vertebra, initial encounter for closed fracture: Secondary | ICD-10-CM | POA: Diagnosis not present

## 2014-10-20 DIAGNOSIS — I10 Essential (primary) hypertension: Secondary | ICD-10-CM | POA: Diagnosis not present

## 2014-10-20 DIAGNOSIS — J449 Chronic obstructive pulmonary disease, unspecified: Secondary | ICD-10-CM | POA: Diagnosis not present

## 2014-10-20 DIAGNOSIS — Z87891 Personal history of nicotine dependence: Secondary | ICD-10-CM | POA: Diagnosis not present

## 2014-10-20 DIAGNOSIS — E039 Hypothyroidism, unspecified: Secondary | ICD-10-CM | POA: Diagnosis not present

## 2014-10-20 DIAGNOSIS — Z853 Personal history of malignant neoplasm of breast: Secondary | ICD-10-CM | POA: Diagnosis not present

## 2014-10-20 NOTE — Discharge Instructions (Signed)
Wound Care Leave incision open to air. You may shower. Do not scrub directly on incision.  Do not put any creams, lotions, or ointments on incision. Activity Walk each and every day, increasing distance each day. No lifting greater than 5 lbs.  Avoid bending, arching, and twisting. No driving for 2 weeks; may ride as a passenger locally. If provided with back brace, wear when out of bed.  It is not necessary to wear in bed. Diet Resume your normal diet.  Return to Work Will be discussed at you follow up appointment. Call Your Doctor If Any of These Occur Redness, drainage, or swelling at the wound.  Temperature greater than 101 degrees. Severe pain not relieved by pain medication. Incision starts to come apart. Follow Up Appt Call today for appointment in 3 weeks ([phone]) or for problems.  If you have any hardware placed in your spine, you will need an x-ray before your appointment.   Balloon Kyphoplasty Balloon kyphoplasty is a procedure in which orthopedic balloons are used to gently raise a collapsed vertebral body in an attempt to alleviate pain. This condition is also called a vertebral compression fracture, or VCF. Most often the cause of this collapse is due to osteoporosis of the vertebral body, which makes the bone susceptible to minor trauma. Osteoporosis is a condition that comes on with aging. Osteoporosis is due to a loss of mineral from the bone. This causes a softening of the bones. The diagnosis of these fractures is usually made with X-rays or magnetic resonance imaging (MRI). Some advantages of this procedure are:  Pain relief.  Restoration of vertebral body height.  Correction of spinal deformity.  Relatively low complication rate. The procedure is usually done by orthopedic surgeons, neurosurgeons, interventional radiologists, and interventional neuroradiologists who specialize in treating the spine with balloon kyphoplasty.  This procedure is not useful  for:  Patients with young, healthy bones or those who have sustained a vertebral body fracture or collapse in a major accident.  Patients with spinal curvature such as scoliosis or kyphosis that is due to causes other than osteoporosis.  Patients who suffer from spinal stenosis or herniated discs with nerve or spinal cord compression and loss of neurological function not associated with a vertebral compression fracture.  Patients with known metastatic disease of the spine. THE BENEFITS OF BALLOON KYPHOPLASTY INCLUDE:  Reduction in back pain. If there is pain due to the procedure, it will typically lessen within two weeks.  Improved quality of life.  Improved mobility (you can get around better).  Improved ability to perform activities of daily living. PROCEDURE  The kyphoplasty procedure involves the use of a balloon to restore the vertebral body height and shape. This is followed by placement of bone cement to strengthen it. The procedure may be done under intravenous sedation, local anesthetic, or general anesthetic. The patient lies face-down on the operating room table. X-ray machines are used to show the collapsed bones.  The surgeon makes two small incisions. A tube is then inserted into the center of the vertebral body. Through this tube, balloons are placed in the vertebral body. Then the balloons are inflated. This creates a cavity. This pushes the bone back toward its normal height and shape.  Once the cavity is created, the surgeon removes the inflatable balloon. The cement is mixed and used to fill the cavity in a slow and controlled fashion. The cement hardens. Then the surgeon takes out the tubes. The incisions are closed with a single  stitch. Patients usually go home the same day. Patients can go back to all normal activities of daily living as soon as possible. There are no restrictions.  RISKS AND COMPLICATIONS As with any surgery, there are potential risks. Although the  procedure is designed to minimize risks, complications may occur. Be sure to discuss the risks with your caregiver. Balloon kyphoplasty is not right for all patients. Complications may require more treatments. Complications that can occur include:  The usual risks of local or general anesthetics apply. These risks depend on the patient's overall health.  Heart attack (myocardial infarction).  Stroke (cerebrovascular accident).  A blockage in the lung (pulmonary embolism - there is a very small chance of the cement traveling to lungs).  There is a small risk of the bone cement leaking from within the boundaries of the vertebral body. In most cases, this rare event does not cause any problems. However, if the cement does leak it may cause:  Pain.  Altered sensation.  Very rarely, paralysis.  Should the cement leak further, more significant surgery may be needed to stop the irritation of the nerves or spinal cord.  In very rare circumstances, the cement may irritate or damage the spinal cord or nerves.  Heart stops beating (cardiac arrest).  Excessive bleeding (hemorrhage).  Infection (there is a small chance of the cement block becoming infected at the time of surgery or even years later). Document Released: 03/05/2004 Document Revised: 08/14/2013 Document Reviewed: 10/09/2008 [facility] Patient Information 2015 [place], [place]. This information is not intended to replace advice given to you by your health care provider. Make sure you discuss any questions you have with your health care provider.

## 2014-10-20 NOTE — Progress Notes (Signed)
Pt doing well. Pt and family given D/C instructions with verbal understanding. Pt's incision is closed with glue and has no sign of infection. Pt's IV was removed prior to D/C. Pt D/C'd home via walking @ 0910 per MD order. Pt is stable @ D/C and has no other needs at this time. Holli Humbles, RN

## 2014-10-20 NOTE — Discharge Summary (Signed)
Physician Discharge Summary  Patient ID: Brandy Peterson MRN: [id number] DOB/AGE: October 17, 1930 81 y.o.  Admit date: 10/19/2014 Discharge date: 10/20/2014  Admission Diagnoses:  L1 compression fracture  Discharge Diagnoses:  L1 compression fracture Active Problems:   Compression fracture of L1 lumbar vertebra   Discharged Condition: good  Hospital Course: Patient was admitted underwent an L1 kyphoplasty. She's had excellent relief of her back pain. She is up and ambulating actively. Her incisions are healing nicely, there clean and dry, no erythema, swelling, or drainage. She is asking to be discharged. She has been given instructions regarding wound care and activities following discharge. She is scheduled follow-up with me in the office in 3 weeks with x-rays. Discharge Exam: Blood pressure 112/50, pulse 65, temperature 97.8 F (36.6 C), temperature source Oral, resp. rate 18, height 5\' 1"  (1.549 m), weight 47.31 kg (104 lb 4.8 oz), SpO2 99 %.  Disposition: 01-Home or Self Care     Medication List    TAKE these medications        Biotin 5000 MCG Caps  Take 1 capsule (5,000 mcg total) by mouth daily.     docusate sodium 100 MG capsule  Commonly known as:  COLACE  Take 100 mg by mouth 2 (two) times daily.     fluticasone 50 MCG/ACT nasal spray  Commonly known as:  FLONASE  Place 1 spray into both nostrils daily.     HYDROcodone-acetaminophen 10-325 MG per tablet  Commonly known as:  NORCO  Take 1 tablet by mouth every 6 (six) hours as needed.     levothyroxine 88 MCG tablet  Commonly known as:  SYNTHROID, LEVOTHROID  Take 1 tablet (88 mcg total) by mouth daily.     omeprazole 20 MG capsule  Commonly known as:  PRILOSEC  Take 20 mg by mouth daily as needed (heartburn).     polyethylene glycol packet  Commonly known as:  MIRALAX / GLYCOLAX  Take 17 g by mouth daily.     SYSTANE OP  Place 1 drop into both eyes as needed (for dry eyes).     triamterene-hydrochlorothiazide  37.5-25 MG per tablet  Commonly known as:  MAXZIDE-25  Take 1 tablet by mouth daily.         SignedHosie Spangle 10/20/2014, 8:56 AM

## 2014-10-22 ENCOUNTER — Telehealth: Payer: Self-pay

## 2014-10-22 ENCOUNTER — Encounter (HOSPITAL_COMMUNITY): Payer: Self-pay | Admitting: Neurosurgery

## 2014-10-22 NOTE — Telephone Encounter (Signed)
Pt on TCM list for L1 back surgery. DC on 10/20/14. Follow up with Neuro.

## 2014-10-23 ENCOUNTER — Encounter: Payer: Self-pay | Admitting: Internal Medicine

## 2014-11-09 DIAGNOSIS — Z9889 Other specified postprocedural states: Secondary | ICD-10-CM | POA: Diagnosis not present

## 2014-11-09 DIAGNOSIS — S32010A Wedge compression fracture of first lumbar vertebra, initial encounter for closed fracture: Secondary | ICD-10-CM | POA: Diagnosis not present

## 2014-11-09 DIAGNOSIS — S32010D Wedge compression fracture of first lumbar vertebra, subsequent encounter for fracture with routine healing: Secondary | ICD-10-CM | POA: Diagnosis not present

## 2014-11-13 ENCOUNTER — Ambulatory Visit (INDEPENDENT_AMBULATORY_CARE_PROVIDER_SITE_OTHER): Payer: Medicare Other | Admitting: Internal Medicine

## 2014-11-13 ENCOUNTER — Encounter: Payer: Self-pay | Admitting: Internal Medicine

## 2014-11-13 VITALS — BP 152/78 | HR 69 | Temp 97.9°F | Ht 61.0 in | Wt 105.0 lb

## 2014-11-13 DIAGNOSIS — S32000S Wedge compression fracture of unspecified lumbar vertebra, sequela: Secondary | ICD-10-CM | POA: Diagnosis not present

## 2014-11-13 DIAGNOSIS — J309 Allergic rhinitis, unspecified: Secondary | ICD-10-CM | POA: Diagnosis not present

## 2014-11-13 DIAGNOSIS — I10 Essential (primary) hypertension: Secondary | ICD-10-CM

## 2014-11-13 MED ORDER — AMLODIPINE BESYLATE 5 MG PO TABS: 5.0000 mg | ORAL_TABLET | Freq: Every day | ORAL | Status: DC

## 2014-11-13 MED ORDER — FLUTICASONE PROPIONATE 50 MCG/ACT NA SUSP: 1.0000 | Freq: Every day | NASAL | Status: DC

## 2014-11-13 NOTE — Progress Notes (Signed)
Pre visit review using our clinic review tool, if applicable. No additional management support is needed unless otherwise documented below in the visit note. 

## 2014-11-13 NOTE — Assessment & Plan Note (Signed)
Mild to mod, for restart flonase asd, to f/u any worsening symptoms or concerns 

## 2014-11-13 NOTE — Assessment & Plan Note (Signed)
Improved pain, ok for handicap parking permit application, o/w cont same tx.f/u NS as planned,  to f/u any worsening symptoms or concerns

## 2014-11-13 NOTE — Assessment & Plan Note (Addendum)
Mild unctonrolle, o/w stable overall by history and exam, recent data reviewed with pt, and pt to continue medical treatment as before except to add amlod 5 qd, f/u BP at home and next visit,  to f/u any worsening symptoms or concerns BP Readings from Last 3 Encounters:  11/13/14 152/78  10/20/14 112/50  10/12/14 162/59

## 2014-11-13 NOTE — Progress Notes (Signed)
Subjective:    Patient ID: Brandy Peterson, female    DOB: May 17, 1930, 81 y.o.   MRN: [id number]  HPI  Here for yearly f/u;  Overall doing ok;  Pt denies Chest pain, worsening SOB, DOE, wheezing, orthopnea, PND, worsening LE edema, palpitations, dizziness or syncope.  Pt denies neurological change such as new headache, facial or extremity weakness.  Pt denies polydipsia, polyuria, or low sugar symptoms. Pt states overall good compliance with treatment and medications, good tolerability, and has been trying to follow appropriate diet.  Pt denies worsening depressive symptoms, suicidal ideation or panic. No fever, night sweats, wt loss, loss of appetite, or other constitutional symptoms.  Pt states good ability with ADL's, has low fall risk, home safety reviewed and adequate, no other significant changes in hearing or vision, and only occasionally active with exercise. Back pain improved s/p kyphoplasty, now up to lifting 15 lbs after last visit with Dr Sherwood Gambler last wk.  Asks for handicap parking permit Does have several wks ongoing nasal allergy symptoms with clearish congestion, itch and sneezing, without fever, pain, ST, cough, swelling or wheezing, but out of flonase Past Medical History  Diagnosis Date  . Raynaud's syndrome   . Hemochromatosis   . Thyroid disease   . IBS (irritable bowel syndrome)   . Backache, unspecified   . Hypertension   . Carcinoma of breast     Right  . Plantar fasciitis   . COPD (chronic obstructive pulmonary disease)   . Varicose veins   . Pneumonia   . Hypothyroidism   . GERD (gastroesophageal reflux disease)   . Complication of anesthesia     was told to never use Sodium Pentothal - mother had reaction. Also had trouble with intubation during mastectomy  . Family history of adverse reaction to anesthesia   . History of shingles   . Spastic colon     hx of    Past Surgical History  Procedure Laterality Date  . Bone spur  1970    BOTH FEET  . Abdominal  hysterectomy  1979    PARTIAL  . Mastectomy  1994    RIGHT  . Mastectomy  1994    Right  . Appendectomy    . Lumbar laminectomy  1991    L5-S1  . Cataract extraction  10/2012 and 12/2012    bilateral with lens implants  . Colonoscopy    . Kyphoplasty N/A 10/19/2014    Procedure: Lumbar 1 kyphoplasty;  Surgeon: Jovita Gamma, MD;  Location: [facility];  Service: Neurosurgery;  Laterality: N/A;    reports that she quit smoking about 26 years ago. Her smoking use included Cigarettes. She has never used smokeless tobacco. She reports that she drinks about 1.8 oz of alcohol per week. She reports that she does not use illicit drugs. family history includes Arthritis in her father; Bleeding Disorder in her mother; Cancer in her mother; Deep vein thrombosis in her father; Heart attack in her brother and father; Heart disease in her brother and father; Hemochromatosis in her brother and father; Hyperlipidemia in her son; Hypertension in her father and mother; Hypothyroidism in her mother; Osteoarthritis in her mother; Varicose Veins in her father and mother. There is no history of Diabetes. Allergies  Allergen Reactions  . Contrast Media [Iodinated Diagnostic Agents] Hives, Itching and Other (See Comments)    Felt like she was going to faint   . Adhesive [Tape] Other (See Comments)    Prefers paper tape  .  Sulfonamide Derivatives Other (See Comments)    Reaction: unknown    Current Outpatient Prescriptions on File Prior to Visit  Medication Sig Dispense Refill  . Biotin 5000 MCG CAPS Take 1 capsule (5,000 mcg total) by mouth daily. 30 capsule   . fluticasone (FLONASE) 50 MCG/ACT nasal spray Place 1 spray into both nostrils daily. (Patient taking differently: Place 1 spray into both nostrils daily as needed for allergies. ) 48 g 3  . levothyroxine (SYNTHROID, LEVOTHROID) 88 MCG tablet Take 1 tablet (88 mcg total) by mouth daily. 90 tablet 3  . omeprazole (PRILOSEC) 20 MG capsule Take 20 mg by  mouth daily as needed (heartburn).     . triamterene-hydrochlorothiazide (MAXZIDE-25) 37.5-25 MG per tablet Take 1 tablet by mouth daily. 90 tablet 3  . docusate sodium (COLACE) 100 MG capsule Take 100 mg by mouth 2 (two) times daily.    Marland Kitchen HYDROcodone-acetaminophen (NORCO) 10-325 MG per tablet Take 1 tablet by mouth every 6 (six) hours as needed. (Patient not taking: Reported on 11/13/2014) 120 tablet 0  . Polyethyl Glycol-Propyl Glycol (SYSTANE OP) Place 1 drop into both eyes as needed (for dry eyes).    . polyethylene glycol (MIRALAX / GLYCOLAX) packet Take 17 g by mouth daily.     No current facility-administered medications on file prior to visit.    Review of Systems Constitutional: Negative for increased diaphoresis, other activity, appetite or siginficant weight change other than noted HENT: Negative for worsening hearing loss, ear pain, facial swelling, mouth sores and neck stiffness.   Eyes: Negative for other worsening pain, redness or visual disturbance.  Respiratory: Negative for shortness of breath and wheezing  Cardiovascular: Negative for chest pain and palpitations.  Gastrointestinal: Negative for diarrhea, blood in stool, abdominal distention or other pain Genitourinary: Negative for hematuria, flank pain or change in urine volume.  Musculoskeletal: Negative for myalgias or other joint complaints.  Skin: Negative for color change and wound or drainage.  Neurological: Negative for syncope and numbness. other than noted Hematological: Negative for adenopathy. or other swelling Psychiatric/Behavioral: Negative for hallucinations, SI, self-injury, decreased concentration or other worsening agitation.      Objective:   Physical Exam BP 152/78 mmHg  Pulse 69  Temp(Src) 97.9 F (36.6 C) (Oral)  Ht 5\' 1"  (1.549 m)  Wt 105 lb (47.628 kg)  BMI 19.85 kg/m2  SpO2 96% VS noted,  Constitutional: Pt is oriented to person, place, and time. Appears well-developed and well-nourished,  in no significant distress Head: Normocephalic and atraumatic.  Right Ear: External ear normal.  Left Ear: External ear normal.  Nose: Nose normal.  Mouth/Throat: Oropharynx is clear and moist.  Bilat tm's with mild erythema.  Max sinus areas non tender.  Pharynx with mild erythema, no exudate Eyes: Conjunctivae and EOM are normal. Pupils are equal, round, and reactive to light.  Neck: Normal range of motion. Neck supple. No JVD present. No tracheal deviation present or significant neck LA or mass Cardiovascular: Normal rate, regular rhythm, normal heart sounds and intact distal pulses.   Pulmonary/Chest: Effort normal and breath sounds without rales or wheezing  Abdominal: Soft. Bowel sounds are normal. NT. No HSM  Musculoskeletal: Normal range of motion. Exhibits no edema.  Lymphadenopathy:  Has no cervical adenopathy.  Neurological: Pt is alert and oriented to person, place, and time. Pt has normal reflexes. No cranial nerve deficit. Motor grossly intact Skin: Skin is warm and dry. No rash noted.  Psychiatric:  Has normal mood and affect. Behavior  is normal.     Assessment & Plan:

## 2014-11-13 NOTE — Patient Instructions (Signed)
Please take all new medication as prescribed - the amlodipine 5 mg for blood pressure  Please continue all other medications as before, and refills have been done if requested - the flonase  Please have the pharmacy call with any other refills you may need.  Please continue your efforts at being more active, and you are given the handicap parking permit application today  Please keep your appointments with your specialists as you may have planned

## 2014-11-16 ENCOUNTER — Telehealth: Payer: Self-pay

## 2014-11-16 ENCOUNTER — Other Ambulatory Visit (HOSPITAL_BASED_OUTPATIENT_CLINIC_OR_DEPARTMENT_OTHER): Payer: Medicare Other

## 2014-11-16 LAB — CBC WITH DIFFERENTIAL/PLATELET
BASO%: 0.6 % (ref 0.0–2.0)
Basophils Absolute: 0 10*3/uL (ref 0.0–0.1)
EOS ABS: 0.2 10*3/uL (ref 0.0–0.5)
EOS%: 3 % (ref 0.0–7.0)
HCT: 41.4 % (ref 34.8–46.6)
HEMOGLOBIN: 14.1 g/dL (ref 11.6–15.9)
LYMPH%: 16.6 % (ref 14.0–49.7)
MCH: 32.3 pg (ref 25.1–34.0)
MCHC: 34.1 g/dL (ref 31.5–36.0)
MCV: 94.7 fL (ref 79.5–101.0)
MONO#: 0.6 10*3/uL (ref 0.1–0.9)
MONO%: 7.7 % (ref 0.0–14.0)
NEUT#: 5.4 10*3/uL (ref 1.5–6.5)
NEUT%: 72.1 % (ref 38.4–76.8)
PLATELETS: 280 10*3/uL (ref 145–400)
RBC: 4.38 10*6/uL (ref 3.70–5.45)
RDW: 13.1 % (ref 11.2–14.5)
WBC: 7.6 10*3/uL (ref 3.9–10.3)
lymph#: 1.3 10*3/uL (ref 0.9–3.3)

## 2014-11-16 LAB — FERRITIN CHCC: Ferritin: 39 ng/ml (ref 9–269)

## 2014-11-16 NOTE — Telephone Encounter (Signed)
Told Brandy Peterson that her ferritin level was 39 today. She does not need a phlebotomy as her ferritin is less than 50 as noted in Dr. Mariana Kaufman parameters below. Pt. To keep appointment for lab in November as scheduled.

## 2014-11-16 NOTE — Telephone Encounter (Signed)
-----   Message from Gordy Levan, MD sent at 10/05/2014  3:48 PM EDT ----- In addition to phlebotomy in next couple of weeks, she will have labs ~ early Aug, early Nov, early Feb 2017. Phlebotomy if ferritin >50  thanks

## 2014-11-22 DIAGNOSIS — D1801 Hemangioma of skin and subcutaneous tissue: Secondary | ICD-10-CM | POA: Diagnosis not present

## 2014-11-22 DIAGNOSIS — L821 Other seborrheic keratosis: Secondary | ICD-10-CM | POA: Diagnosis not present

## 2014-11-22 DIAGNOSIS — L570 Actinic keratosis: Secondary | ICD-10-CM | POA: Diagnosis not present

## 2014-11-22 DIAGNOSIS — D225 Melanocytic nevi of trunk: Secondary | ICD-10-CM | POA: Diagnosis not present

## 2014-11-22 DIAGNOSIS — L820 Inflamed seborrheic keratosis: Secondary | ICD-10-CM | POA: Diagnosis not present

## 2014-11-22 DIAGNOSIS — L812 Freckles: Secondary | ICD-10-CM | POA: Diagnosis not present

## 2014-12-11 ENCOUNTER — Telehealth: Payer: Self-pay | Admitting: Internal Medicine

## 2014-12-11 NOTE — Telephone Encounter (Signed)
Pt called in and said that she is taking a new meds and she is feeling dizzy and balance issues.  She would like to talk to nurse if this meds is cause side effects  [phone]

## 2014-12-12 ENCOUNTER — Encounter: Payer: Self-pay | Admitting: Internal Medicine

## 2014-12-12 NOTE — Telephone Encounter (Signed)
If no falls to date, I would check nurse visit for orthostatics first please, then consider med changes if BP overcontrolled

## 2014-12-12 NOTE — Telephone Encounter (Signed)
Pt states that she thinks BP medication is causing lightheadedness and dizziness. She has not been checking BP to see if it is caused by hypotension. Pt is requesting to D/C Norvasc to see if it is the cause while monitoring her BP?

## 2014-12-13 NOTE — Telephone Encounter (Signed)
Left message on machine for pt to return my call  

## 2014-12-18 NOTE — Telephone Encounter (Signed)
Several messages left for pt with no reply. Closing phone note until further contact with pt

## 2014-12-24 DIAGNOSIS — Z853 Personal history of malignant neoplasm of breast: Secondary | ICD-10-CM | POA: Diagnosis not present

## 2014-12-24 DIAGNOSIS — Z1231 Encounter for screening mammogram for malignant neoplasm of breast: Secondary | ICD-10-CM | POA: Diagnosis not present

## 2014-12-24 LAB — HM MAMMOGRAPHY: HM MAMMO: NEGATIVE

## 2014-12-26 DIAGNOSIS — H01009 Unspecified blepharitis unspecified eye, unspecified eyelid: Secondary | ICD-10-CM | POA: Insufficient documentation

## 2014-12-26 DIAGNOSIS — Z961 Presence of intraocular lens: Secondary | ICD-10-CM | POA: Diagnosis not present

## 2015-01-01 ENCOUNTER — Encounter: Payer: Self-pay | Admitting: Internal Medicine

## 2015-01-11 ENCOUNTER — Encounter: Payer: Self-pay | Admitting: Vascular Surgery

## 2015-01-15 ENCOUNTER — Other Ambulatory Visit: Payer: Self-pay | Admitting: *Deleted

## 2015-01-15 ENCOUNTER — Encounter: Payer: Self-pay | Admitting: Vascular Surgery

## 2015-01-15 ENCOUNTER — Ambulatory Visit (INDEPENDENT_AMBULATORY_CARE_PROVIDER_SITE_OTHER): Payer: Medicare Other | Admitting: Vascular Surgery

## 2015-01-15 VITALS — BP 150/62 | HR 72 | Temp 97.6°F | Resp 16 | Ht 61.0 in | Wt 105.0 lb

## 2015-01-15 DIAGNOSIS — I83899 Varicose veins of unspecified lower extremities with other complications: Secondary | ICD-10-CM | POA: Insufficient documentation

## 2015-01-15 DIAGNOSIS — I83892 Varicose veins of left lower extremities with other complications: Secondary | ICD-10-CM

## 2015-01-15 DIAGNOSIS — I83812 Varicose veins of left lower extremities with pain: Secondary | ICD-10-CM

## 2015-01-15 NOTE — Progress Notes (Signed)
Subjective:     Patient ID: Brandy Peterson, female   DOB: Jul 20, 1930, 81 y.o.   MRN: [id number]  HPI this 81 year old female returns for continued follow-up regarding her painful varicosities left leg worse than right. She has bilateral swelling and pain in both legs which worsens as the day progresses. She tried long-leg elastic compression stockings 20-30 mm gradient as well as elevation ibuprofen but his hadn't no relief of her symptoms. She has no history of DVT thrombophlebitis or stasis ulcers. She has developed progressive bulging in the left calf as well as extensive spider vein progression near the ankle areas.  Past Medical History  Diagnosis Date  . Raynaud's syndrome   . Hemochromatosis   . Thyroid disease   . IBS (irritable bowel syndrome)   . Backache, unspecified   . Hypertension   . Carcinoma of breast ([place])     Right  . Plantar fasciitis   . COPD (chronic obstructive pulmonary disease) ([place])   . Varicose veins   . Pneumonia   . Hypothyroidism   . GERD (gastroesophageal reflux disease)   . Complication of anesthesia     was told to never use Sodium Pentothal - mother had reaction. Also had trouble with intubation during mastectomy  . Family history of adverse reaction to anesthesia   . History of shingles   . Spastic colon     hx of     Social History  Substance Use Topics  . Smoking status: Former Smoker    Types: Cigarettes    Quit date: 04/13/1988  . Smokeless tobacco: Never Used     Comment: approximate quit date  . Alcohol Use: 1.8 oz/week    3 Glasses of wine per week     Comment: 2 OZ, 5 TIMES A WEEK    Family History  Problem Relation Age of Onset  . Cancer Mother     metastatic cancer (pancreas vs lung)  . Hypertension Mother   . Osteoarthritis Mother   . Hypothyroidism Mother   . Varicose Veins Mother   . Bleeding Disorder Mother   . Heart disease Father     CAD/MI  . Hypertension Father   . Arthritis Father   . Hemochromatosis Father    Possible  . Deep vein thrombosis Father   . Varicose Veins Father   . Heart attack Father   . Diabetes Neg Hx   . Hemochromatosis Brother   . Heart disease Brother   . Heart attack Brother   . Hyperlipidemia Son     Allergies  Allergen Reactions  . Contrast Media [Iodinated Diagnostic Agents] Hives, Itching and Other (See Comments)    Felt like she was going to faint   . Adhesive [Tape] Other (See Comments)    Prefers paper tape  . Sulfonamide Derivatives Other (See Comments)    Reaction: unknown      Current outpatient prescriptions:  .  amLODipine (NORVASC) 5 MG tablet, Take 1 tablet (5 mg total) by mouth daily., Disp: 90 tablet, Rfl: 3 .  Biotin 5000 MCG CAPS, Take 1 capsule (5,000 mcg total) by mouth daily., Disp: 30 capsule, Rfl:  .  docusate sodium (COLACE) 100 MG capsule, Take 100 mg by mouth 2 (two) times daily., Disp: , Rfl:  .  fluticasone (FLONASE) 50 MCG/ACT nasal spray, Place 1 spray into both nostrils daily., Disp: 48 g, Rfl: 3 .  levothyroxine (SYNTHROID, LEVOTHROID) 88 MCG tablet, Take 1 tablet (88 mcg total) by mouth daily., Disp: 90  tablet, Rfl: 3 .  omeprazole (PRILOSEC) 20 MG capsule, Take 20 mg by mouth daily as needed (heartburn). , Disp: , Rfl:  .  Polyethyl Glycol-Propyl Glycol (SYSTANE OP), Place 1 drop into both eyes as needed (for dry eyes)., Disp: , Rfl:  .  polyethylene glycol (MIRALAX / GLYCOLAX) packet, Take 17 g by mouth daily., Disp: , Rfl:  .  triamterene-hydrochlorothiazide (MAXZIDE-25) 37.5-25 MG per tablet, Take 1 tablet by mouth daily., Disp: 90 tablet, Rfl: 3  Filed Vitals:   01/15/15 1129 01/15/15 1130  BP: 151/66 150/62  Pulse: 72   Temp: 97.6 F (36.4 C)   Resp: 16   Height: 5\' 1"  (1.549 m)   Weight: 105 lb (47.628 kg)   SpO2: 99%     Body mass index is 19.85 kg/(m^2).           Review of Systems denies chest pain but does have chronic COPD. No hemoptysis or claudication.     Objective:   Physical Exam BP 150/62  mmHg  Pulse 72  Temp(Src) 97.6 F (36.4 C)  Resp 16  Ht 5\' 1"  (1.549 m)  Wt 105 lb (47.628 kg)  BMI 19.85 kg/m2  SpO2 99%  Gen. well-developed well-nourished female no apparent distress alert and oriented 3 Lungs no rhonchi or wheezing Left leg with large bulging varicosities in the proximal calf posteriorly extending up to the popliteal crease. Extensive network of reticular and spider veins around medial and lateral malleolus. 1+ distal edema. 3+ to Salas pedis pulse palpable.     Assessment:     Patient with documented gross reflux in left small saphenous vein supplying painful varicosities which are symptomatic and have not responded to conservative measures including long-leg elastic compression stockings 20-30 mm gradient, elevation, and ibuprofen. These are currently affecting patient's daily living    Plan:     Patient needs laser ablation left small saphenous vein. She then needs to return in 3 months to see if stab phlebectomy and sclerotherapy would be indicated We will proceed with precertification to perform this in the near future to relieve her symptoms

## 2015-01-15 NOTE — Progress Notes (Signed)
Filed Vitals:   01/15/15 1129 01/15/15 1130  BP: 151/66 150/62  Pulse: 72   Temp: 97.6 F (36.4 C)   Resp: 16   Height: 5\' 1"  (1.549 m)   Weight: 105 lb (47.628 kg)   SpO2: 99%

## 2015-02-06 ENCOUNTER — Encounter: Payer: Self-pay | Admitting: Vascular Surgery

## 2015-02-08 ENCOUNTER — Encounter: Payer: Self-pay | Admitting: Vascular Surgery

## 2015-02-11 ENCOUNTER — Encounter: Payer: Self-pay | Admitting: Vascular Surgery

## 2015-02-11 ENCOUNTER — Ambulatory Visit (INDEPENDENT_AMBULATORY_CARE_PROVIDER_SITE_OTHER): Payer: Medicare Other | Admitting: Vascular Surgery

## 2015-02-11 VITALS — BP 140/67 | HR 75 | Temp 98.2°F | Resp 16 | Ht 61.0 in | Wt 106.0 lb

## 2015-02-11 DIAGNOSIS — I83899 Varicose veins of unspecified lower extremities with other complications: Secondary | ICD-10-CM | POA: Insufficient documentation

## 2015-02-11 DIAGNOSIS — I83892 Varicose veins of left lower extremities with other complications: Secondary | ICD-10-CM | POA: Diagnosis not present

## 2015-02-11 NOTE — Progress Notes (Signed)
Laser Ablation Procedure    Date: 02/11/2015   Brandy Peterson DOB:1930-10-06  Consent signed: Yes    Surgeon:  Dr. Nelda Severe. Kellie Simmering  Procedure: Laser Ablation: left Small Saphenous Vein  BP 140/67 mmHg  Pulse 75  Temp(Src) 98.2 F (36.8 C)  Resp 16  Ht 5\' 1"  (1.549 m)  Wt 106 lb (48.081 kg)  BMI 20.04 kg/m2  SpO2 98%  Tumescent Anesthesia: 50 cc 0.9% NaCl with 50 cc Lidocaine HCL with 1% Epi and 15 cc 8.4% NaHCO3  Local Anesthesia: 2 cc Lidocaine HCL and NaHCO3 (ratio 2:1)  Pulsed Mode: 15 watts, 545ms delay, 1.0 duration  Total Energy: 1086            Total Pulses: 73               Total Time: 1:12    Patient tolerated procedure well  Notes:   Description of Procedure:  After marking the course of the secondary varicosities, the patient was placed on the operating table in the prone position, and the left leg was prepped and draped in sterile fashion.   Local anesthetic was administered and under ultrasound guidance the saphenous vein was accessed with a micro needle and guide wire; then the mirco puncture sheath was placed.  A guide wire was inserted saphenopopliteal junction , followed by a 5 french sheath.  The position of the sheath and then the laser fiber below the junction was confirmed using the ultrasound.  Tumescent anesthesia was administered along the course of the saphenous vein using ultrasound guidance. The patient was placed in Trendelenburg position and protective laser glasses were placed on patient and staff, and the laser was fired at 15 watts continuous mode advancing 1-73mm/second for a total of 1086 joules.     Steri strips were applied to the stab wounds and ABD pads and thigh high compression stockings were applied.  Ace wrap bandages were applied over the phlebectomy sites and at the top of the saphenopopliteal junction. Blood loss was less than 15 cc.  The patient ambulated out of the operating room having tolerated the procedure well.

## 2015-02-11 NOTE — Progress Notes (Signed)
Subjective:     Patient ID: Brandy Peterson, female   DOB: [birth year], 81 y.o.   MRN: [id number]  HPI  This 81 year old female had laser ablation of the left small saphenous vein from the mid calf 2 the mid thigh area. There was no discrete  Saphenous- popliteal junction. There was a vein of [place]. A total of 1000 J of energy was utilized. She tolerated the procedure well. It was done under local tumescent anesthesia.  Review of Systems     Objective:   Physical Exam BP 140/67 mmHg  Pulse 75  Temp(Src) 98.2 F (36.8 C)  Resp 16  Ht 5\' 1"  (1.549 m)  Wt 106 lb (48.081 kg)  BMI 20.04 kg/m2  SpO2 98%       Assessment:       well tolerated laser ablation left small saphenous vein performed under local tumescent anesthesia     Plan:      return in 1 week for venous duplex exam to confirm closure left small saphenous vein in patient will then return in 3 months to see if stab phlebectomy of painful varicosities is indicated

## 2015-02-12 ENCOUNTER — Telehealth: Payer: Self-pay | Admitting: *Deleted

## 2015-02-12 ENCOUNTER — Telehealth: Payer: Self-pay | Admitting: Oncology

## 2015-02-12 NOTE — Telephone Encounter (Signed)
Pt doing well. No problems or pain. Following all instructions. Answered all her questions.

## 2015-02-12 NOTE — Telephone Encounter (Signed)
Patient called i to reschedule her labs

## 2015-02-14 ENCOUNTER — Other Ambulatory Visit (HOSPITAL_BASED_OUTPATIENT_CLINIC_OR_DEPARTMENT_OTHER): Payer: Medicare Other

## 2015-02-14 LAB — CBC WITH DIFFERENTIAL/PLATELET
BASO%: 0.2 % (ref 0.0–2.0)
Basophils Absolute: 0 10*3/uL (ref 0.0–0.1)
EOS%: 3.4 % (ref 0.0–7.0)
Eosinophils Absolute: 0.3 10*3/uL (ref 0.0–0.5)
HEMATOCRIT: 40.8 % (ref 34.8–46.6)
HEMOGLOBIN: 14.2 g/dL (ref 11.6–15.9)
LYMPH%: 12.1 % — ABNORMAL LOW (ref 14.0–49.7)
MCH: 32.2 pg (ref 25.1–34.0)
MCHC: 34.8 g/dL (ref 31.5–36.0)
MCV: 92.5 fL (ref 79.5–101.0)
MONO#: 0.6 10*3/uL (ref 0.1–0.9)
MONO%: 6.4 % (ref 0.0–14.0)
NEUT%: 77.9 % — ABNORMAL HIGH (ref 38.4–76.8)
NEUTROS ABS: 7.4 10*3/uL — AB (ref 1.5–6.5)
Platelets: 223 10*3/uL (ref 145–400)
RBC: 4.41 10*6/uL (ref 3.70–5.45)
RDW: 12.7 % (ref 11.2–14.5)
WBC: 9.5 10*3/uL (ref 3.9–10.3)
lymph#: 1.2 10*3/uL (ref 0.9–3.3)

## 2015-02-14 LAB — FERRITIN CHCC: Ferritin: 65 ng/ml (ref 9–269)

## 2015-02-15 ENCOUNTER — Other Ambulatory Visit: Payer: Self-pay | Admitting: Oncology

## 2015-02-15 ENCOUNTER — Other Ambulatory Visit: Payer: Medicare Other

## 2015-02-15 ENCOUNTER — Telehealth: Payer: Self-pay

## 2015-02-15 ENCOUNTER — Encounter: Payer: Self-pay | Admitting: Vascular Surgery

## 2015-02-15 NOTE — Telephone Encounter (Signed)
L/M for Brandy Peterson stating that her Ferritin level from 02-14-15 was 65.  She needs to have a phlebotomy as per Dr. Mariana Kaufman parameters as noted below. Sent an order to the schedulers  to set up phlebotomy in the next couple of weeks.

## 2015-02-15 NOTE — Telephone Encounter (Signed)
Patient Demographics     Patient Name Sex DOB SSN Address Phone    Brandy Peterson, Brandy Peterson Female November 07, 1930 [id number] [place] [address] [phone] (Home) *Preferred*      Message  Received: 4 months ago    Gordy Levan, MD  Baruch Merl, RN           In addition to phlebotomy in next couple of weeks, she will have labs ~ early Aug, early Nov, early Feb 2017.  Phlebotomy if ferritin >50   thanks

## 2015-02-18 ENCOUNTER — Ambulatory Visit (HOSPITAL_COMMUNITY)
Admission: RE | Admit: 2015-02-18 | Discharge: 2015-02-18 | Disposition: A | Discharge status: Home / Self Care | Payer: Medicare Other | Source: Ambulatory Visit | Attending: Vascular Surgery | Admitting: Vascular Surgery

## 2015-02-18 ENCOUNTER — Telehealth: Payer: Self-pay | Admitting: Oncology

## 2015-02-18 DIAGNOSIS — I83812 Varicose veins of left lower extremities with pain: Secondary | ICD-10-CM | POA: Insufficient documentation

## 2015-02-18 NOTE — Telephone Encounter (Signed)
Patient called in to move her 11/2015 appointments,done

## 2015-02-18 NOTE — Telephone Encounter (Signed)
Had called patient with phlebot appointemnt and she needs this week  And can do the pm,ok per maggie and i have left her a message with a new time and date

## 2015-02-19 ENCOUNTER — Encounter: Payer: Self-pay | Admitting: Vascular Surgery

## 2015-02-19 ENCOUNTER — Ambulatory Visit (INDEPENDENT_AMBULATORY_CARE_PROVIDER_SITE_OTHER): Payer: Medicare Other | Admitting: Vascular Surgery

## 2015-02-19 VITALS — BP 126/48 | HR 79 | Temp 97.8°F | Resp 16 | Ht 61.0 in | Wt 106.0 lb

## 2015-02-19 DIAGNOSIS — I83893 Varicose veins of bilateral lower extremities with other complications: Secondary | ICD-10-CM | POA: Insufficient documentation

## 2015-02-19 NOTE — Progress Notes (Signed)
Subjective:     Patient ID: Brandy Peterson, female   DOB: 12-15-1930, 81 y.o.   MRN: [id number]  HPI this 81 year old female returns 1 week post-laser ablation left small saphenous vei  done for gross reflux with painful varicosities. She has had some mild discomfort along the course of the small saphenous vein. She's had no distal edema. She has worn her elastic compression stocking and taken ibuprofen as instructed.  Past Medical History  Diagnosis Date  . Raynaud's syndrome   . Hemochromatosis   . Thyroid disease   . IBS (irritable bowel syndrome)   . Backache, unspecified   . Hypertension   . Carcinoma of breast ([place])     Right  . Plantar fasciitis   . COPD (chronic obstructive pulmonary disease) ([place])   . Varicose veins   . Pneumonia   . Hypothyroidism   . GERD (gastroesophageal reflux disease)   . Complication of anesthesia     was told to never use Sodium Pentothal - mother had reaction. Also had trouble with intubation during mastectomy  . Family history of adverse reaction to anesthesia   . History of shingles   . Spastic colon     hx of     Social History  Substance Use Topics  . Smoking status: Former Smoker    Types: Cigarettes    Quit date: 04/13/1988  . Smokeless tobacco: Never Used     Comment: approximate quit date  . Alcohol Use: 1.8 oz/week    3 Glasses of wine per week     Comment: 2 OZ, 5 TIMES A WEEK    Family History  Problem Relation Age of Onset  . Cancer Mother     metastatic cancer (pancreas vs lung)  . Hypertension Mother   . Osteoarthritis Mother   . Hypothyroidism Mother   . Varicose Veins Mother   . Bleeding Disorder Mother   . Heart disease Father     CAD/MI  . Hypertension Father   . Arthritis Father   . Hemochromatosis Father     Possible  . Deep vein thrombosis Father   . Varicose Veins Father   . Heart attack Father   . Diabetes Neg Hx   . Hemochromatosis Brother   . Heart disease Brother   . Heart attack Brother   .  Hyperlipidemia Son     Allergies  Allergen Reactions  . Contrast Media [Iodinated Diagnostic Agents] Hives, Itching and Other (See Comments)    Felt like she was going to faint   . Adhesive [Tape] Other (See Comments)    Prefers paper tape  . Sulfonamide Derivatives Other (See Comments)    Reaction: unknown      Current outpatient prescriptions:  .  amLODipine (NORVASC) 5 MG tablet, Take 1 tablet (5 mg total) by mouth daily. (Patient not taking: Reported on 02/11/2015), Disp: 90 tablet, Rfl: 3 .  Biotin 5000 MCG CAPS, Take 1 capsule (5,000 mcg total) by mouth daily., Disp: 30 capsule, Rfl:  .  docusate sodium (COLACE) 100 MG capsule, Take 100 mg by mouth 2 (two) times daily., Disp: , Rfl:  .  fluticasone (FLONASE) 50 MCG/ACT nasal spray, Place 1 spray into both nostrils daily., Disp: 48 g, Rfl: 3 .  levothyroxine (SYNTHROID, LEVOTHROID) 88 MCG tablet, Take 1 tablet (88 mcg total) by mouth daily., Disp: 90 tablet, Rfl: 3 .  omeprazole (PRILOSEC) 20 MG capsule, Take 20 mg by mouth daily as needed (heartburn). , Disp: , Rfl:  .  Polyethyl Glycol-Propyl Glycol (SYSTANE OP), Place 1 drop into both eyes as needed (for dry eyes)., Disp: , Rfl:  .  polyethylene glycol (MIRALAX / GLYCOLAX) packet, Take 17 g by mouth daily., Disp: , Rfl:  .  triamterene-hydrochlorothiazide (MAXZIDE-25) 37.5-25 MG per tablet, Take 1 tablet by mouth daily., Disp: 90 tablet, Rfl: 3  Filed Vitals:   02/19/15 1326  BP: 126/48  Pulse: 79  Temp: 97.8 F (36.6 C)  Resp: 16  Height: 5\' 1"  (1.549 m)  Weight: 106 lb (48.081 kg)  SpO2: 99%    Body mass index is 20.04 kg/(m^2).          Review of Systems denies chest pain, dyspnea on exertion, PND, orthopnea, claudication, hemoptysis     Objective:   Physical Exam BP 126/48 mmHg  Pulse 79  Temp(Src) 97.8 F (36.6 C)  Resp 16  Ht 5\' 1"  (1.549 m)  Wt 106 lb (48.081 kg)  BMI 20.04 kg/m2  SpO2 99%  Gen. well-developed well-nourished female in no  apparent distress alert and oriented 3 Lungs no rhonchi or wheezing Left leg with mild tenderness to palpation over short saphenous vein. No distal edema. 3 posterior Sallis pedis pulse palpable. Varicosities in the calf area posteriorly are less tense than previously.  Today I ordered a venous duplex exam of the left leg which I reviewed and interpreted. There is no DVT. The left small saphenous vein is totally closed up to near the 70 popliteal junction.     Assessment:     Successful laser ablation left small saphenous vein for gross reflux with painful varicosities    Plan:     Return in 3 months to evaluate for possible stab phlebectomy of residual varicosities. Patient does have gross reflux in bilateral great saphenous veins but these are not large caliber veins at the present time

## 2015-02-20 ENCOUNTER — Ambulatory Visit (HOSPITAL_BASED_OUTPATIENT_CLINIC_OR_DEPARTMENT_OTHER): Payer: Medicare Other

## 2015-02-20 NOTE — Progress Notes (Signed)
I called Louise RN at Dr. Mariana Kaufman desk to confirm phlebotomy with Ferritin at 44. Ok to proceed. 500 cc phlebotomized from patient starting at  1508 ending at 1515 from Left antecubital using a standard 16 g phlebotomy set. Patient offered snack and fluids. No complaints voiced. Patient observed for 30 minutes post phlebotomy.

## 2015-02-20 NOTE — Patient Instructions (Signed)
Therapeutic Phlebotomy, Care After  Refer to this sheet in the next few weeks. These instructions provide you with information about caring for yourself after your procedure. Your health care provider may also give you more specific instructions. Your treatment has been planned according to current medical practices, but problems sometimes occur. Call your health care provider if you have any problems or questions after your procedure.  WHAT TO EXPECT AFTER THE PROCEDURE  After your procedure, it is common to have:   Light-headedness or dizziness. You may feel faint.   Nausea.   Tiredness.  HOME CARE INSTRUCTIONS  Activities   Return to your normal activities as directed by your health care provider. Most people can go back to their normal activities right away.   Avoid strenuous physical activity and heavy lifting or pulling for about 5 hours after the procedure. Do not lift anything that is heavier than 10 lb (4.5 kg).   Athletes should avoid strenuous exercise for at least 12 hours.   Change positions slowly for the remainder of the day. This will help to prevent light-headedness or fainting.   If you feel light-headed, lie down until the feeling goes away.  Eating and Drinking   Be sure to eat well-balanced meals for the next 24 hours.   Drink enough fluid to keep your urine clear or pale yellow.   Avoid drinking alcohol on the day that you had the procedure.  Care of the Needle Insertion Site   Keep your bandage dry. You can remove the bandage after about 5 hours or as directed by your health care provider.   If you have bleeding from the needle insertion site, elevate your arm and press firmly on the site until the bleeding stops.   If you have bruising at the site, apply ice to the area:   Put ice in a plastic bag.   Place a towel between your skin and the bag.   Leave the ice on for 20 minutes, 2-3 times a day for the first 24 hours.   If the swelling does not go away after 24 hours, apply  a warm, moist washcloth to the area for 20 minutes, 2-3 times a day.  General Instructions   Avoid smoking for at least 30 minutes after the procedure.   Keep all follow-up visits as directed by your health care provider. It is important to continue with further therapeutic phlebotomy treatments as directed.  SEEK MEDICAL CARE IF:   You have redness, swelling, or pain at the needle insertion site.   You have fluid, blood, or pus coming from the needle insertion site.   You feel light-headed, dizzy, or nauseated, and the feeling does not go away.   You notice new bruising at the needle insertion site.   You feel weaker than normal.   You have a fever or chills.  SEEK IMMEDIATE MEDICAL CARE IF:   You have severe nausea or vomiting.   You have chest pain.   You have trouble breathing.    This information is not intended to replace advice given to you by your health care provider. Make sure you discuss any questions you have with your health care provider.    Document Released: 09/01/2010 Document Revised: 08/14/2014 Document Reviewed: 03/26/2014  Elsevier Interactive Patient Education 2016 [employer]

## 2015-03-11 DIAGNOSIS — Z23 Encounter for immunization: Secondary | ICD-10-CM | POA: Diagnosis not present

## 2015-04-01 ENCOUNTER — Telehealth: Payer: Self-pay | Admitting: Internal Medicine

## 2015-04-01 ENCOUNTER — Other Ambulatory Visit: Payer: Self-pay | Admitting: Internal Medicine

## 2015-04-01 NOTE — Telephone Encounter (Signed)
Called pt verified which med she is needing. Pt is needing her Triamterene & levothyroxine. Inform pt per chart refills was sent back to [facility] this am.../lmb

## 2015-04-01 NOTE — Telephone Encounter (Signed)
Her meds have run out and would needs a 90day supply of her meds. Can you call pt when you get a chance ????      Best number [phone]

## 2015-04-04 DIAGNOSIS — L570 Actinic keratosis: Secondary | ICD-10-CM | POA: Diagnosis not present

## 2015-04-04 DIAGNOSIS — D485 Neoplasm of uncertain behavior of skin: Secondary | ICD-10-CM | POA: Diagnosis not present

## 2015-04-05 ENCOUNTER — Encounter (HOSPITAL_COMMUNITY): Payer: Self-pay | Admitting: Emergency Medicine

## 2015-04-05 ENCOUNTER — Emergency Department (INDEPENDENT_AMBULATORY_CARE_PROVIDER_SITE_OTHER)
Admission: EM | Admit: 2015-04-05 | Discharge: 2015-04-05 | Disposition: A | Discharge status: Home / Self Care | Payer: Medicare Other | Source: Home / Self Care | Attending: Emergency Medicine | Admitting: Emergency Medicine

## 2015-04-05 DIAGNOSIS — S81811A Laceration without foreign body, right lower leg, initial encounter: Secondary | ICD-10-CM

## 2015-04-05 DIAGNOSIS — S81812A Laceration without foreign body, left lower leg, initial encounter: Secondary | ICD-10-CM

## 2015-04-05 MED ORDER — TETANUS-DIPHTH-ACELL PERTUSSIS 5-2.5-18.5 LF-MCG/0.5 IM SUSP
INTRAMUSCULAR | Status: AC
  Filled 2015-04-05: qty 0.5

## 2015-04-05 MED ORDER — TETANUS-DIPHTH-ACELL PERTUSSIS 5-2.5-18.5 LF-MCG/0.5 IM SUSP
0.5000 mL | Freq: Once | INTRAMUSCULAR | Status: AC
  Administered 2015-04-05: 0.5 mL via INTRAMUSCULAR

## 2015-04-05 NOTE — Discharge Instructions (Signed)
We have cleaned and bandaged your wounds. We also updated your tetanus. Leave the bandages on for 2 days. Once you remove the bandages, gently wash the wounds with soap and water twice a day. You can apply some antibiotic ointment for a few days, after that it is not necessary. Use a nonadhesive bandage for the next week or so to prevent irritation from your pants. Please follow-up with your primary care doctor in about a week for a recheck.

## 2015-04-05 NOTE — ED Provider Notes (Signed)
CSN: [id number]     Arrival date & time 04/05/15  1747 History   First MD Initiated Contact with Patient 04/05/15 1821     Chief Complaint  Patient presents with  . Leg Injury   (Consider location/radiation/quality/duration/timing/severity/associated sxs/prior Treatment) HPI  She is an 81 year old woman here for evaluation of bilateral lower leg injuries. She states she was going to use the mower to bag some leaves when the bag popped off and hit her lower legs, knocking her down. She reports skin tears to both shins. She did wash them at home and apply peroxide.  She reports multiple other similar injuries, but none to this degree. She denies any significant pain.  She states she is due for a tetanus shot.  Past Medical History  Diagnosis Date  . Raynaud's syndrome   . Hemochromatosis   . Thyroid disease   . IBS (irritable bowel syndrome)   . Backache, unspecified   . Hypertension   . Carcinoma of breast ([place])     Right  . Plantar fasciitis   . COPD (chronic obstructive pulmonary disease) ([place])   . Varicose veins   . Pneumonia   . Hypothyroidism   . GERD (gastroesophageal reflux disease)   . Complication of anesthesia     was told to never use Sodium Pentothal - mother had reaction. Also had trouble with intubation during mastectomy  . Family history of adverse reaction to anesthesia   . History of shingles   . Spastic colon     hx of    Past Surgical History  Procedure Laterality Date  . Bone spur  1970    BOTH FEET  . Abdominal hysterectomy  1979    PARTIAL  . Mastectomy  1994    RIGHT  . Mastectomy  1994    Right  . Appendectomy    . Lumbar laminectomy  1991    L5-S1  . Cataract extraction  10/2012 and 12/2012    bilateral with lens implants  . Colonoscopy    . Kyphoplasty N/A 10/19/2014    Procedure: Lumbar 1 kyphoplasty;  Surgeon: Jovita Gamma, MD;  Location: [facility];  Service: Neurosurgery;  Laterality: N/A;   Family History  Problem Relation Age of  Onset  . Cancer Mother     metastatic cancer (pancreas vs lung)  . Hypertension Mother   . Osteoarthritis Mother   . Hypothyroidism Mother   . Varicose Veins Mother   . Bleeding Disorder Mother   . Heart disease Father     CAD/MI  . Hypertension Father   . Arthritis Father   . Hemochromatosis Father     Possible  . Deep vein thrombosis Father   . Varicose Veins Father   . Heart attack Father   . Diabetes Neg Hx   . Hemochromatosis Brother   . Heart disease Brother   . Heart attack Brother   . Hyperlipidemia Son    Social History  Substance Use Topics  . Smoking status: Former Smoker    Types: Cigarettes    Quit date: 04/13/1988  . Smokeless tobacco: Never Used     Comment: approximate quit date  . Alcohol Use: 1.8 oz/week    3 Glasses of wine per week     Comment: 2 OZ, 5 TIMES A WEEK   OB History    No data available     Review of Systems As in history of present illness Allergies  Contrast media; Adhesive; and Sulfonamide derivatives  Home  Medications   Prior to Admission medications   Medication Sig Start Date End Date Taking? Authorizing Provider  amLODipine (NORVASC) 5 MG tablet Take 1 tablet (5 mg total) by mouth daily. Patient not taking: Reported on 02/11/2015 11/13/14 11/13/15  Biagio Borg, MD  Biotin 5000 MCG CAPS Take 1 capsule (5,000 mcg total) by mouth daily. 02/08/12   Neena Rhymes, MD  docusate sodium (COLACE) 100 MG capsule Take 100 mg by mouth 2 (two) times daily.    Historical Provider, MD  fluticasone (FLONASE) 50 MCG/ACT nasal spray Place 1 spray into both nostrils daily. 11/13/14   Biagio Borg, MD  levothyroxine (SYNTHROID, LEVOTHROID) 88 MCG tablet TAKE ONE TABLET BY MOUTH ONCE DAILY 04/01/15   Biagio Borg, MD  omeprazole (PRILOSEC) 20 MG capsule Take 20 mg by mouth daily as needed (heartburn).     Historical Provider, MD  Polyethyl Glycol-Propyl Glycol (SYSTANE OP) Place 1 drop into both eyes as needed (for dry eyes).    Historical  Provider, MD  polyethylene glycol (MIRALAX / GLYCOLAX) packet Take 17 g by mouth daily.    Historical Provider, MD  triamterene-hydrochlorothiazide (MAXZIDE-25) 37.5-25 MG tablet TAKE ONE TABLET BY MOUTH ONCE DAILY 04/01/15   Biagio Borg, MD   Meds Ordered and Administered this Visit   Medications  Tdap (BOOSTRIX) injection 0.5 mL (0.5 mLs Intramuscular Given 04/05/15 1859)    BP 153/82 mmHg  Pulse 77  Temp(Src) 98 F (36.7 C) (Oral)  SpO2 97% No data found.   Physical Exam  Constitutional: She is oriented to person, place, and time. She appears well-developed and well-nourished. No distress.  Cardiovascular: Normal rate.   Pulmonary/Chest: Effort normal.  Neurological: She is alert and oriented to person, place, and time.  Skin:       ED Course  Procedures (including critical care time)  Wounds were cleaned with Hibiclens. Wounds were debrided. Bandages applied.  Labs Review Labs Reviewed - No data to display  Imaging Review No results found.    MDM   1. Skin tear of lower leg without complication, left, initial encounter   2. Skin tear of lower leg without complication, right, initial encounter    Cleaned and bandaged her wounds. Updated her tetanus. Wound care as in after visit summary. Follow-up with PCP as needed.    Melony Overly, MD 04/05/15 [phone]

## 2015-04-05 NOTE — ED Notes (Signed)
The patient presented to the [facility] with a complaint of skin laceration and avulsions to both the left and right lower legs from a lawnmower. The patient stated that the bagger came off of the rear of the pushmower and hit her legs and knoecked her down.

## 2015-04-12 ENCOUNTER — Ambulatory Visit (INDEPENDENT_AMBULATORY_CARE_PROVIDER_SITE_OTHER): Payer: Medicare Other | Admitting: Internal Medicine

## 2015-04-12 ENCOUNTER — Other Ambulatory Visit (INDEPENDENT_AMBULATORY_CARE_PROVIDER_SITE_OTHER): Payer: Medicare Other

## 2015-04-12 ENCOUNTER — Encounter: Payer: Self-pay | Admitting: Internal Medicine

## 2015-04-12 VITALS — BP 146/72 | HR 71 | Temp 98.0°F | Ht 61.0 in | Wt 107.0 lb

## 2015-04-12 DIAGNOSIS — E559 Vitamin D deficiency, unspecified: Secondary | ICD-10-CM | POA: Diagnosis not present

## 2015-04-12 DIAGNOSIS — E039 Hypothyroidism, unspecified: Secondary | ICD-10-CM

## 2015-04-12 DIAGNOSIS — I10 Essential (primary) hypertension: Secondary | ICD-10-CM

## 2015-04-12 DIAGNOSIS — T148XXA Other injury of unspecified body region, initial encounter: Secondary | ICD-10-CM | POA: Insufficient documentation

## 2015-04-12 DIAGNOSIS — T148 Other injury of unspecified body region: Secondary | ICD-10-CM

## 2015-04-12 LAB — HEPATIC FUNCTION PANEL
ALK PHOS: 85 U/L (ref 39–117)
ALT: 14 U/L (ref 0–35)
AST: 20 U/L (ref 0–37)
Albumin: 4.1 g/dL (ref 3.5–5.2)
BILIRUBIN DIRECT: 0.1 mg/dL (ref 0.0–0.3)
BILIRUBIN TOTAL: 0.5 mg/dL (ref 0.2–1.2)
Total Protein: 7.3 g/dL (ref 6.0–8.3)

## 2015-04-12 LAB — URINALYSIS, ROUTINE W REFLEX MICROSCOPIC
Bilirubin Urine: NEGATIVE
Hgb urine dipstick: NEGATIVE
Ketones, ur: NEGATIVE
LEUKOCYTES UA: NEGATIVE
Nitrite: NEGATIVE
Specific Gravity, Urine: <=1.005 — AB (ref 1.000–1.030)
Total Protein, Urine: NEGATIVE
Urine Glucose: NEGATIVE
Urobilinogen, UA: 0.2 (ref 0.0–1.0)
pH: 6.5 (ref 5.0–8.0)

## 2015-04-12 LAB — VITAMIN D 25 HYDROXY (VIT D DEFICIENCY, FRACTURES): VITD: 24.52 ng/mL — AB (ref 30.00–100.00)

## 2015-04-12 LAB — TSH: TSH: 7.79 u[IU]/mL — ABNORMAL HIGH (ref 0.35–4.50)

## 2015-04-12 LAB — BASIC METABOLIC PANEL
BUN: 23 mg/dL (ref 6–23)
CO2: 31 mEq/L (ref 19–32)
Calcium: 9.7 mg/dL (ref 8.4–10.5)
Chloride: 99 mEq/L (ref 96–112)
Creatinine, Ser: 0.81 mg/dL (ref 0.40–1.20)
GFR: 71.9 mL/min (ref >60.00)
Glucose, Bld: 80 mg/dL (ref 70–99)
POTASSIUM: 4.4 meq/L (ref 3.5–5.1)
SODIUM: 136 meq/L (ref 135–145)

## 2015-04-12 LAB — T4, FREE: Free T4: 1.25 ng/dL (ref 0.60–1.60)

## 2015-04-12 LAB — LIPID PANEL
Cholesterol: 209 mg/dL — ABNORMAL HIGH (ref 0–200)
HDL: 75.6 mg/dL (ref >39.00)
LDL CALC: 120 mg/dL — AB (ref 0–99)
NONHDL: 133.64
Total CHOL/HDL Ratio: 3
Triglycerides: 66 mg/dL (ref 0.0–149.0)
VLDL: 13.2 mg/dL (ref 0.0–40.0)

## 2015-04-12 NOTE — Assessment & Plan Note (Signed)
Good initial response to wound care without s/s infectoin so far, ok to cont neosporin topical and dressing changes daily until healed

## 2015-04-12 NOTE — Assessment & Plan Note (Signed)
With recent worsening hair thinning, o/w stable overall by history and exam, recent data reviewed with pt, and pt to continue medical treatment as before,  to f/u any worsening symptoms or concerns Lab Results  Component Value Date   TSH 1.66 04/12/2014  for f/u lab

## 2015-04-12 NOTE — Patient Instructions (Signed)
Please continue the wound care until healed  Please continue all other medications as before, and refills have been done if requested.  Please have the pharmacy call with any other refills you may need.  Please continue your efforts at being more active, low cholesterol diet, and weight control.  Please keep your appointments with your specialists as you may have planned  Please go to the LAB in the Basement (turn left off the elevator) for the tests to be done today  You will be contacted by phone if any changes need to be made immediately.  Otherwise, you will receive a letter about your results with an explanation, but please check with MyChart first.  Please remember to sign up for MyChart if you have not done so, as this will be important to you in the future with finding out test results, communicating by private email, and scheduling acute appointments online when needed.

## 2015-04-12 NOTE — Assessment & Plan Note (Signed)
stable overall by history and exam, recent data reviewed with pt, and pt to continue medical treatment as before,  to f/u any worsening symptoms or concerns BP Readings from Last 3 Encounters:  04/12/15 146/72  04/05/15 153/82  02/20/15 162/67   Tolerating the new amlod 5 qd since last visit, also for labs today

## 2015-04-12 NOTE — Progress Notes (Signed)
Pre visit review using our clinic review tool, if applicable. No additional management support is needed unless otherwise documented below in the visit note. 

## 2015-04-12 NOTE — Progress Notes (Signed)
Subjective:    Patient ID: Brandy Peterson, female    DOB: [birth year], 81 y.o.   MRN: [id number]  HPI  Here to f/u after accident in the yard where a leaf bag hit her lowe legs with mult skin tears, rather larger areas, seen and debrided at [facility], has done well with topical neosporin, non stick pads, gauze and paper tape applications daily. No fever, redness, worsening pain, swelling, drainage or red streaks.  Pt denies chest pain, increased sob or doe, wheezing, orthopnea, PND, increased LE swelling, palpitations, dizziness or syncope.  Pt denies new neurological symptoms such as new headache, or facial or extremity weakness or numbness   Pt denies polydipsia, polyuria, or low sugar symptoms such as weakness or confusion improved with po intake.  Pt states overall good compliance with meds, trying to follow lower cholesterol, diabetic diet, wt overall stable.  Does have hair thinning worse recently, and asks for labs today instead of waiting for her appt in feb 2017.  Did have recent cbc nov 2016 neg for significant Tetanus is up to date Past Medical History  Diagnosis Date  . Raynaud's syndrome   . Hemochromatosis   . Thyroid disease   . IBS (irritable bowel syndrome)   . Backache, unspecified   . Hypertension   . Carcinoma of breast ([place])     Right  . Plantar fasciitis   . COPD (chronic obstructive pulmonary disease) ([place])   . Varicose veins   . Pneumonia   . Hypothyroidism   . GERD (gastroesophageal reflux disease)   . Complication of anesthesia     was told to never use Sodium Pentothal - mother had reaction. Also had trouble with intubation during mastectomy  . Family history of adverse reaction to anesthesia   . History of shingles   . Spastic colon     hx of    Past Surgical History  Procedure Laterality Date  . Bone spur  1970    BOTH FEET  . Abdominal hysterectomy  1979    PARTIAL  . Mastectomy  1994    RIGHT  . Mastectomy  1994    Right  . Appendectomy    . Lumbar  laminectomy  1991    L5-S1  . Cataract extraction  10/2012 and 12/2012    bilateral with lens implants  . Colonoscopy    . Kyphoplasty N/A 10/19/2014    Procedure: Lumbar 1 kyphoplasty;  Surgeon: Jovita Gamma, MD;  Location: [facility];  Service: Neurosurgery;  Laterality: N/A;    reports that she quit smoking about 27 years ago. Her smoking use included Cigarettes. She has never used smokeless tobacco. She reports that she drinks about 1.8 oz of alcohol per week. She reports that she does not use illicit drugs. family history includes Arthritis in her father; Bleeding Disorder in her mother; Cancer in her mother; Deep vein thrombosis in her father; Heart attack in her brother and father; Heart disease in her brother and father; Hemochromatosis in her brother and father; Hyperlipidemia in her son; Hypertension in her father and mother; Hypothyroidism in her mother; Osteoarthritis in her mother; Varicose Veins in her father and mother. There is no history of Diabetes. Allergies  Allergen Reactions  . Contrast Media [Iodinated Diagnostic Agents] Hives, Itching and Other (See Comments)    Felt like she was going to faint   . Adhesive [Tape] Other (See Comments)    Prefers paper tape  . Sulfonamide Derivatives Other (See Comments)  Reaction: unknown    Current Outpatient Prescriptions on File Prior to Visit  Medication Sig Dispense Refill  . Biotin 5000 MCG CAPS Take 1 capsule (5,000 mcg total) by mouth daily. 30 capsule   . fluticasone (FLONASE) 50 MCG/ACT nasal spray Place 1 spray into both nostrils daily. 48 g 3  . levothyroxine (SYNTHROID, LEVOTHROID) 88 MCG tablet TAKE ONE TABLET BY MOUTH ONCE DAILY 90 tablet 0  . omeprazole (PRILOSEC) 20 MG capsule Take 20 mg by mouth daily as needed (heartburn).     Vladimir Faster Glycol-Propyl Glycol (SYSTANE OP) Place 1 drop into both eyes as needed (for dry eyes).    . triamterene-hydrochlorothiazide (MAXZIDE-25) 37.5-25 MG tablet TAKE ONE TABLET BY  MOUTH ONCE DAILY 90 tablet 0  . amLODipine (NORVASC) 5 MG tablet Take 1 tablet (5 mg total) by mouth daily. (Patient not taking: Reported on 02/11/2015) 90 tablet 3  . docusate sodium (COLACE) 100 MG capsule Take 100 mg by mouth 2 (two) times daily. Reported on 04/12/2015    . polyethylene glycol (MIRALAX / GLYCOLAX) packet Take 17 g by mouth daily. Reported on 04/12/2015     No current facility-administered medications on file prior to visit.   Review of Systems  Constitutional: Negative for unusual diaphoresis or night sweats HENT: Negative for ringing in ear or discharge Eyes: Negative for double vision or worsening visual disturbance.  Respiratory: Negative for choking and stridor.   Gastrointestinal: Negative for vomiting or other signifcant bowel change Genitourinary: Negative for hematuria or change in urine volume.  Musculoskeletal: Negative for other MSK pain or swelling Skin: Negative for color change and worsening wound.  Neurological: Negative for tremors and numbness other than noted  Psychiatric/Behavioral: Negative for decreased concentration or agitation other than above  '    Objective:   Physical Exam BP 146/72 mmHg  Pulse 71  Temp(Src) 98 F (36.7 C) (Oral)  Ht 5\' 1"  (1.549 m)  Wt 107 lb (48.535 kg)  BMI 20.23 kg/m2  SpO2 99% VS noted,  Constitutional: Pt appears in no significant distress HENT: Head: NCAT.  Right Ear: External ear normal.  Left Ear: External ear normal.  Eyes: . Pupils are equal, round, and reactive to light. Conjunctivae and EOM are normal Neck: Normal range of motion. Neck supple.  Cardiovascular: Normal rate and regular rhythm.   Pulmonary/Chest: Effort normal and breath sounds without rales or wheezing.  Abd:  Soft, NT, ND, + BS Neurological: Pt is alert. Not confused , motor grossly intact Skin: Skin is warm. No rash, no LE edema, skin tear wounds x 2 approx 1 x 2  Cm each to pretibal distal RLE show good granular tissue, minor  nontender surrounding minimal erythma, without other significant swelling/red/tender,drainage or red streaks.  Same appearance to the larger single left pretibial mid leg skin tear approx 2 x 1.5 cm.  Psychiatric: Pt behavior is normal. No agitation.     Assessment & Plan:

## 2015-04-15 ENCOUNTER — Other Ambulatory Visit: Payer: Self-pay | Admitting: Internal Medicine

## 2015-04-15 MED ORDER — PRAVASTATIN SODIUM 20 MG PO TABS: 20.0000 mg | ORAL_TABLET | Freq: Every day | ORAL | Status: DC

## 2015-04-15 MED ORDER — LEVOTHYROXINE SODIUM 100 MCG PO TABS: 100.0000 ug | ORAL_TABLET | Freq: Every day | ORAL | Status: DC

## 2015-04-24 DIAGNOSIS — L570 Actinic keratosis: Secondary | ICD-10-CM | POA: Diagnosis not present

## 2015-05-15 ENCOUNTER — Ambulatory Visit (INDEPENDENT_AMBULATORY_CARE_PROVIDER_SITE_OTHER): Payer: Medicare Other | Admitting: Internal Medicine

## 2015-05-15 ENCOUNTER — Encounter: Payer: Self-pay | Admitting: Internal Medicine

## 2015-05-15 VITALS — BP 112/68 | HR 67 | Temp 98.8°F | Resp 20 | Ht 61.0 in | Wt 108.0 lb

## 2015-05-15 DIAGNOSIS — J309 Allergic rhinitis, unspecified: Secondary | ICD-10-CM

## 2015-05-15 DIAGNOSIS — I10 Essential (primary) hypertension: Secondary | ICD-10-CM | POA: Diagnosis not present

## 2015-05-15 DIAGNOSIS — E039 Hypothyroidism, unspecified: Secondary | ICD-10-CM | POA: Diagnosis not present

## 2015-05-15 NOTE — Progress Notes (Signed)
Subjective:    Patient ID: Brandy Peterson, female    DOB: 06-13-1930, 82 y.o.   MRN: [id number]  HPI Here for yearly f/u;  Overall doing very well for age;  Pt denies Chest pain, worsening SOB, DOE, wheezing, orthopnea, PND, worsening LE edema, palpitations, dizziness or syncope.  Pt denies neurological change such as new headache, facial or extremity weakness.  Pt denies polydipsia, polyuria, or low sugar symptoms. Pt states overall good compliance with treatment and medications, good tolerability, and has been trying to follow appropriate diet.  Pt denies worsening depressive symptoms, suicidal ideation or panic. No fever, night sweats, wt loss, loss of appetite, or other constitutional symptoms.  Pt states good ability with ADL's, has low fall risk, home safety reviewed and adequate, no other significant changes in hearing or vision, and only occasionally active with exercise.  Does see derm with several skin lesions, no melanoma. Mult skin tears from dec 2016 improved, has just seen dermatology early jan, had some precancerous lesion right face near the eyebrow and tip of nose  - s/p frozen tx. No new complints  Does have several wks ongoing nasal allergy symptoms with clearish congestion, itch and sneezing, without fever, pain, ST, cough, swelling or wheezing, better with re-start allergy meds Past Medical History  Diagnosis Date  . Raynaud's syndrome   . Hemochromatosis   . Thyroid disease   . IBS (irritable bowel syndrome)   . Backache, unspecified   . Hypertension   . Carcinoma of breast ([place])     Right  . Plantar fasciitis   . COPD (chronic obstructive pulmonary disease) ([place])   . Varicose veins   . Pneumonia   . Hypothyroidism   . GERD (gastroesophageal reflux disease)   . Complication of anesthesia     was told to never use Sodium Pentothal - mother had reaction. Also had trouble with intubation during mastectomy  . Family history of adverse reaction to anesthesia   . History of  shingles   . Spastic colon     hx of    Past Surgical History  Procedure Laterality Date  . Bone spur  1970    BOTH FEET  . Abdominal hysterectomy  1979    PARTIAL  . Mastectomy  1994    RIGHT  . Mastectomy  1994    Right  . Appendectomy    . Lumbar laminectomy  1991    L5-S1  . Cataract extraction  10/2012 and 12/2012    bilateral with lens implants  . Colonoscopy    . Kyphoplasty N/A 10/19/2014    Procedure: Lumbar 1 kyphoplasty;  Surgeon: Jovita Gamma, MD;  Location: [place];  Service: Neurosurgery;  Laterality: N/A;    reports that she quit smoking about 27 years ago. Her smoking use included Cigarettes. She has never used smokeless tobacco. She reports that she drinks about 1.8 oz of alcohol per week. She reports that she does not use illicit drugs. family history includes Arthritis in her father; Bleeding Disorder in her mother; Cancer in her mother; Deep vein thrombosis in her father; Heart attack in her brother and father; Heart disease in her brother and father; Hemochromatosis in her brother and father; Hyperlipidemia in her son; Hypertension in her father and mother; Hypothyroidism in her mother; Osteoarthritis in her mother; Varicose Veins in her father and mother. There is no history of Diabetes. Allergies  Allergen Reactions  . Contrast Media [Iodinated Diagnostic Agents] Hives, Itching and Other (See Comments)  Felt like she was going to faint   . Adhesive [Tape] Other (See Comments)    Prefers paper tape  . Sulfonamide Derivatives Other (See Comments)    Reaction: unknown    Current Outpatient Prescriptions on File Prior to Visit  Medication Sig Dispense Refill  . Biotin 5000 MCG CAPS Take 1 capsule (5,000 mcg total) by mouth daily. 30 capsule   . fluticasone (FLONASE) 50 MCG/ACT nasal spray Place 1 spray into both nostrils daily. 48 g 3  . levothyroxine (SYNTHROID, LEVOTHROID) 100 MCG tablet Take 1 tablet (100 mcg total) by mouth daily. 90 tablet 3  .  omeprazole (PRILOSEC) 20 MG capsule Take 20 mg by mouth daily as needed (heartburn).     . triamterene-hydrochlorothiazide (MAXZIDE-25) 37.5-25 MG tablet TAKE ONE TABLET BY MOUTH ONCE DAILY 90 tablet 0   No current facility-administered medications on file prior to visit.   Review of Systems  Constitutional: Negative for unusual diaphoresis or night sweats HENT: Negative for ringing in ear or discharge Eyes: Negative for double vision or worsening visual disturbance.  Respiratory: Negative for choking and stridor.   Gastrointestinal: Negative for vomiting or other signifcant bowel change Genitourinary: Negative for hematuria or change in urine volume.  Musculoskeletal: Negative for other MSK pain or swelling Skin: Negative for color change and worsening wound.  Neurological: Negative for tremors and numbness other than noted  Psychiatric/Behavioral: Negative for decreased concentration or agitation other than above       Objective:   Physical Exam BP 112/68 mmHg  Pulse 67  Temp(Src) 98.8 F (37.1 C) (Oral)  Resp 20  Ht 5\' 1"  (1.549 m)  Wt 108 lb (48.988 kg)  BMI 20.42 kg/m2  SpO2 98% VS noted, not ill appearing Constitutional: Pt is oriented to person, place, and time. Appears well-developed and well-nourished, in no significant distress Head: Normocephalic and atraumatic.  Right Ear: External ear normal.  Left Ear: External ear normal.  Nose: Nose normal.  Mouth/Throat: Oropharynx is clear and moist.  Eyes: Conjunctivae and EOM are normal. Pupils are equal, round, and reactive to light.  Neck: Normal range of motion. Neck supple. No JVD present. No tracheal deviation present or significant neck LA or mass Cardiovascular: Normal rate, regular rhythm, normal heart sounds and intact distal pulses.   Pulmonary/Chest: Effort normal and breath sounds without rales or wheezing  Abdominal: Soft. Bowel sounds are normal. NT. No HSM  Musculoskeletal: Normal range of motion. Exhibits  no edema.  Lymphadenopathy:  Has no cervical adenopathy.  Neurological: Pt is alert and oriented to person, place, and time. Pt has normal reflexes. No cranial nerve deficit. Motor grossly intact Skin: Skin is warm and dry. No rash noted.  Psychiatric:  Has normal mood and affect. Behavior is normal.      Assessment & Plan:

## 2015-05-15 NOTE — Patient Instructions (Signed)
Please continue all other medications as before, and refills have been done if requested.  Please have the pharmacy call with any other refills you may need.  Please continue your efforts at being more active, low cholesterol diet, and weight control.  You are otherwise up to date with prevention measures today.  Please keep your appointments with your specialists as you may have planned  Please return in 6 months, or sooner if needed 

## 2015-05-15 NOTE — Assessment & Plan Note (Signed)
stable overall by history and exam, recent data reviewed with pt, and pt to continue medical treatment as before,  to f/u any worsening symptoms or concerns Lab Results  Component Value Date   WBC 9.5 02/14/2015   HGB 14.2 02/14/2015   HCT 40.8 02/14/2015   PLT 223 02/14/2015   GLUCOSE 80 04/12/2015   CHOL 209* 04/12/2015   TRIG 66.0 04/12/2015   HDL 75.60 04/12/2015   LDLDIRECT 93.6 12/26/2010   LDLCALC 120* 04/12/2015   ALT 14 04/12/2015   AST 20 04/12/2015   NA 136 04/12/2015   K 4.4 04/12/2015   CL 99 04/12/2015   CREATININE 0.81 04/12/2015   BUN 23 04/12/2015   CO2 31 04/12/2015   TSH 7.79* 04/12/2015

## 2015-05-15 NOTE — Assessment & Plan Note (Signed)
stable overall by history and exam, recent data reviewed with pt, and pt to continue medical treatment as before,  to f/u any worsening symptoms or concerns BP Readings from Last 3 Encounters:  05/15/15 112/68  04/12/15 146/72  04/05/15 153/82

## 2015-05-15 NOTE — Assessment & Plan Note (Signed)
stable overall by history and exam, recent data reviewed with pt, and pt to continue medical treatment as before,  to f/u any worsening symptoms or concerns Lab Results  Component Value Date   TSH 7.79* 04/12/2015   For f/u lab today

## 2015-05-15 NOTE — Progress Notes (Signed)
Pre visit review using our clinic review tool, if applicable. No additional management support is needed unless otherwise documented below in the visit note. 

## 2015-05-17 ENCOUNTER — Other Ambulatory Visit (HOSPITAL_BASED_OUTPATIENT_CLINIC_OR_DEPARTMENT_OTHER): Payer: Medicare Other

## 2015-05-17 LAB — CBC WITH DIFFERENTIAL/PLATELET
BASO%: 0.5 % (ref 0.0–2.0)
BASOS ABS: 0 10*3/uL (ref 0.0–0.1)
EOS%: 2.8 % (ref 0.0–7.0)
Eosinophils Absolute: 0.2 10*3/uL (ref 0.0–0.5)
HEMATOCRIT: 44.4 % (ref 34.8–46.6)
HGB: 15.2 g/dL (ref 11.6–15.9)
LYMPH#: 1.2 10*3/uL (ref 0.9–3.3)
LYMPH%: 13.7 % — ABNORMAL LOW (ref 14.0–49.7)
MCH: 31.7 pg (ref 25.1–34.0)
MCHC: 34.2 g/dL (ref 31.5–36.0)
MCV: 92.7 fL (ref 79.5–101.0)
MONO#: 0.6 10*3/uL (ref 0.1–0.9)
MONO%: 6.7 % (ref 0.0–14.0)
NEUT#: 6.9 10*3/uL — ABNORMAL HIGH (ref 1.5–6.5)
NEUT%: 76.3 % (ref 38.4–76.8)
Platelets: 230 10*3/uL (ref 145–400)
RBC: 4.78 10*6/uL (ref 3.70–5.45)
RDW: 11.7 % (ref 11.2–14.5)
WBC: 9 10*3/uL (ref 3.9–10.3)

## 2015-05-20 LAB — FERRITIN: Ferritin: 45 ng/ml (ref 9–269)

## 2015-05-21 ENCOUNTER — Encounter: Payer: Self-pay | Admitting: Vascular Surgery

## 2015-05-23 ENCOUNTER — Telehealth: Payer: Self-pay

## 2015-05-23 NOTE — Telephone Encounter (Signed)
Pt called asking about her ferritin level and if she will need a phlebotomy. Called pt back w/ferritin of 45 and phlebotomy is not needed . Confirmed next lab is 08/16/15.

## 2015-05-28 ENCOUNTER — Encounter: Payer: Self-pay | Admitting: Vascular Surgery

## 2015-05-28 ENCOUNTER — Ambulatory Visit: Payer: Medicare Other | Admitting: Vascular Surgery

## 2015-05-28 ENCOUNTER — Ambulatory Visit (INDEPENDENT_AMBULATORY_CARE_PROVIDER_SITE_OTHER): Payer: Medicare Other | Admitting: Vascular Surgery

## 2015-05-28 VITALS — BP 172/73 | HR 78 | Temp 97.6°F | Resp 16 | Ht 61.0 in | Wt 108.0 lb

## 2015-05-28 DIAGNOSIS — I83892 Varicose veins of left lower extremities with other complications: Secondary | ICD-10-CM | POA: Diagnosis not present

## 2015-05-28 NOTE — Progress Notes (Signed)
Subjective:     Patient ID: Brandy Peterson, female   DOB: 1930-09-22, 82 y.o.   MRN: [id number]  HPI  This 82 year old female returns for 3 months follow-up regarding her laser ablation of the left small saphenous vein for painful varicosities. She's done well from that standpoint but continues to have some aching and throbbing discomfort in the residual varicosities in her left posterior calf and left ankle area. She has no distal edema. She has no symptoms in the right leg. She did injure the anterior aspect of both legs with a lawnmower bag  In the summer and this is slowly healing.  Past Medical History  Diagnosis Date  . Raynaud's syndrome   . Hemochromatosis   . Thyroid disease   . IBS (irritable bowel syndrome)   . Backache, unspecified   . Hypertension   . Carcinoma of breast ([place])     Right  . Plantar fasciitis   . COPD (chronic obstructive pulmonary disease) ([employer])   . Varicose veins   . Pneumonia   . Hypothyroidism   . GERD (gastroesophageal reflux disease)   . Complication of anesthesia     was told to never use Sodium Pentothal - mother had reaction. Also had trouble with intubation during mastectomy  . Family history of adverse reaction to anesthesia   . History of shingles   . Spastic colon     hx of     Social History  Substance Use Topics  . Smoking status: Former Smoker    Types: Cigarettes    Quit date: 04/13/1988  . Smokeless tobacco: Never Used     Comment: approximate quit date  . Alcohol Use: 1.8 oz/week    3 Glasses of wine per week     Comment: 2 OZ, 5 TIMES A WEEK    Family History  Problem Relation Age of Onset  . Cancer Mother     metastatic cancer (pancreas vs lung)  . Hypertension Mother   . Osteoarthritis Mother   . Hypothyroidism Mother   . Varicose Veins Mother   . Bleeding Disorder Mother   . Heart disease Father     CAD/MI  . Hypertension Father   . Arthritis Father   . Hemochromatosis Father     Possible  . Deep vein thrombosis  Father   . Varicose Veins Father   . Heart attack Father   . Diabetes Neg Hx   . Hemochromatosis Brother   . Heart disease Brother   . Heart attack Brother   . Hyperlipidemia Son     Allergies  Allergen Reactions  . Contrast Media [Iodinated Diagnostic Agents] Hives, Itching and Other (See Comments)    Felt like she was going to faint   . Adhesive [Tape] Other (See Comments)    Prefers paper tape  . Sulfonamide Derivatives Other (See Comments)    Reaction: unknown      Current outpatient prescriptions:  .  Biotin 5000 MCG CAPS, Take 1 capsule (5,000 mcg total) by mouth daily., Disp: 30 capsule, Rfl:  .  fluticasone (FLONASE) 50 MCG/ACT nasal spray, Place 1 spray into both nostrils daily., Disp: 48 g, Rfl: 3 .  levothyroxine (SYNTHROID, LEVOTHROID) 100 MCG tablet, Take 1 tablet (100 mcg total) by mouth daily., Disp: 90 tablet, Rfl: 3 .  omeprazole (PRILOSEC) 20 MG capsule, Take 20 mg by mouth daily as needed (heartburn). , Disp: , Rfl:  .  triamterene-hydrochlorothiazide (MAXZIDE-25) 37.5-25 MG tablet, TAKE ONE TABLET BY MOUTH ONCE DAILY,  Disp: 90 tablet, Rfl: 0  Filed Vitals:   05/28/15 1544  BP: 172/73  Pulse: 78  Temp: 97.6 F (36.4 C)  Resp: 16  Height: 5\' 1"  (1.549 m)  Weight: 108 lb (48.988 kg)  SpO2: 98%    Body mass index is 20.42 kg/(m^2).          Review of Systems Denies chest pain, dyspnea on exertion, PND, orthopnea, hemoptysis     Objective:   Physical Exam BP 172/73 mmHg  Pulse 78  Temp(Src) 97.6 F (36.4 C)  Resp 16  Ht 5\' 1"  (1.549 m)  Wt 108 lb (48.988 kg)  BMI 20.42 kg/m2  SpO2 98%   Gen. Well-developed well-nourished female in no apparent distress alert and oriented 3  lungs no rhonchi or wheezing Left leg with bulging residual varicosities in the posterior calf extending down to near the Achilles tendon. No hyperpigmentation distally. 3+ dorsalis pedis pulse palpable.     Assessment:      painful residual varicosities left  calf following laser ablation left small saphenous vein 3 months ago. These are still causing aching throbbing and burning discomfort     Plan:     . Plan stab phlebectomy of residual varicosities left leg in the near future  +1 course of sclerotherapy to complete her treatment regimen

## 2015-05-31 DIAGNOSIS — R140 Abdominal distension (gaseous): Secondary | ICD-10-CM | POA: Diagnosis not present

## 2015-05-31 DIAGNOSIS — Z853 Personal history of malignant neoplasm of breast: Secondary | ICD-10-CM | POA: Diagnosis not present

## 2015-06-05 ENCOUNTER — Encounter: Payer: Self-pay | Admitting: Vascular Surgery

## 2015-06-10 ENCOUNTER — Ambulatory Visit (INDEPENDENT_AMBULATORY_CARE_PROVIDER_SITE_OTHER): Payer: Medicare Other | Admitting: Vascular Surgery

## 2015-06-10 ENCOUNTER — Encounter: Payer: Self-pay | Admitting: Vascular Surgery

## 2015-06-10 VITALS — BP 136/62 | HR 76 | Temp 98.0°F | Resp 16 | Ht 61.0 in | Wt 108.0 lb

## 2015-06-10 DIAGNOSIS — I83892 Varicose veins of left lower extremities with other complications: Secondary | ICD-10-CM

## 2015-06-10 NOTE — Progress Notes (Signed)
Subjective:     Patient ID: Oretha Milch, female   DOB: 11-28-1930, 82 y.o.   MRN: [id number]  HPI This 82 year old female had multiple stab phlebectomy  -10-20 - performed under local tumescent anesthesia in the left posterior calf and lateral calf area. She tolerated the procedure well.   Review of Systems     Objective:   Physical Exam BP 136/62 mmHg  Pulse 76  Temp(Src) 98 F (36.7 C)  Resp 16  Ht 5\' 1"  (1.549 m)  Wt 108 lb (48.988 kg)  BMI 20.42 kg/m2  SpO2 99%       Assessment:      well tolerated multiple stab phlebectomy performed under local tumescent anesthesia - 10-20-left calf area     Plan:      patient to have sclerotherapy performed by Kathlee Nations in April 2017 and will check surgical sites at that point        Otherwise  Return on when necessary basis

## 2015-06-10 NOTE — Progress Notes (Signed)
    Stab Phlebectomy Procedure  Brandy Peterson DOB:17-Jan-1931  06/10/2015  Consent signed: Yes  Surgeon:J.D. Kellie Simmering  Procedure: stab phlebectomy: left leg  BP 136/62 mmHg  Pulse 76  Temp(Src) 98 F (36.7 C)  Resp 16  Ht 5\' 1"  (1.549 m)  Wt 108 lb (48.988 kg)  BMI 20.42 kg/m2  SpO2 99%  Start time: 1pm   End time: 1:30pm   Tumescent Anesthesia: 100 cc 0.9% NaCl with 50 cc Lidocaine HCL with 1% Epi and 15 cc 8.4% NaHCO3  Local Anesthesia: 3 cc Lidocaine HCL and NaHCO3 (ratio 2:1)    Stab Phlebectomy: 10-20 Sites: Calf  Patient tolerated procedure well: Yes  Notes:   Description of Procedure:  After marking the course of the secondary varicosities, the patient was placed on the operating table in the prone position, and the left leg was prepped and draped in sterile fashion.    The patient was then put into Trendelenburg position.  Local anesthetic was administered at the previously marked varicosities, and tumescent anesthesia was administered around the vessels.  Ten to 20 stab wounds were made using the tip of an 11 blade. And using the vein hook, the phlebectomies were performed using a hemostat to avulse the varicosities.  Adequate hemostasis was achieved, and steri strips were applied to the stab wound.      ABD pads and thigh high compression stockings were applied as well ace wraps where needed. Blood loss was less than 15 cc.  The patient ambulated out of the operating room having tolerated the procedure well.

## 2015-06-11 ENCOUNTER — Encounter: Payer: Self-pay | Admitting: Vascular Surgery

## 2015-06-17 ENCOUNTER — Encounter: Payer: Self-pay | Admitting: Vascular Surgery

## 2015-06-17 ENCOUNTER — Other Ambulatory Visit: Payer: Self-pay | Admitting: Internal Medicine

## 2015-06-17 ENCOUNTER — Encounter: Payer: Self-pay | Admitting: Internal Medicine

## 2015-07-16 ENCOUNTER — Encounter: Payer: Self-pay | Admitting: *Deleted

## 2015-07-24 ENCOUNTER — Encounter: Payer: Self-pay | Admitting: Vascular Surgery

## 2015-07-24 ENCOUNTER — Ambulatory Visit (INDEPENDENT_AMBULATORY_CARE_PROVIDER_SITE_OTHER): Payer: Medicare Other | Admitting: *Deleted

## 2015-07-24 DIAGNOSIS — I83893 Varicose veins of bilateral lower extremities with other complications: Secondary | ICD-10-CM | POA: Diagnosis not present

## 2015-07-24 NOTE — Progress Notes (Signed)
X=.3% Sotradecol administered with a 27g butterfly.  Patient received a total of 12cc. Treated all areas of concern. Easy access. Tol well. Anticipate good results. Follow prn.   Photos: No.  Compression stockings applied: Yes.

## 2015-08-16 ENCOUNTER — Other Ambulatory Visit (HOSPITAL_BASED_OUTPATIENT_CLINIC_OR_DEPARTMENT_OTHER): Payer: Medicare Other

## 2015-08-16 ENCOUNTER — Other Ambulatory Visit: Payer: Self-pay | Admitting: Oncology

## 2015-08-16 LAB — CBC WITH DIFFERENTIAL/PLATELET
BASO%: 0.4 % (ref 0.0–2.0)
BASOS ABS: 0 10*3/uL (ref 0.0–0.1)
EOS ABS: 0.2 10*3/uL (ref 0.0–0.5)
EOS%: 2.5 % (ref 0.0–7.0)
HEMATOCRIT: 41.2 % (ref 34.8–46.6)
HEMOGLOBIN: 13.9 g/dL (ref 11.6–15.9)
LYMPH#: 1.3 10*3/uL (ref 0.9–3.3)
LYMPH%: 15 % (ref 14.0–49.7)
MCH: 31.6 pg (ref 25.1–34.0)
MCHC: 33.7 g/dL (ref 31.5–36.0)
MCV: 93.7 fL (ref 79.5–101.0)
MONO#: 0.5 10*3/uL (ref 0.1–0.9)
MONO%: 6 % (ref 0.0–14.0)
NEUT%: 76.1 % (ref 38.4–76.8)
NEUTROS ABS: 6.3 10*3/uL (ref 1.5–6.5)
Platelets: 253 10*3/uL (ref 145–400)
RBC: 4.4 10*6/uL (ref 3.70–5.45)
RDW: 12.8 % (ref 11.2–14.5)
WBC: 8.3 10*3/uL (ref 3.9–10.3)

## 2015-08-16 LAB — FERRITIN: Ferritin: 60 ng/ml (ref 9–269)

## 2015-08-19 ENCOUNTER — Telehealth: Payer: Self-pay | Admitting: Oncology

## 2015-08-19 NOTE — Telephone Encounter (Signed)
Spoke with patient to confirm infusion appt for Friday per [facility] pof

## 2015-08-19 NOTE — Telephone Encounter (Signed)
-----   Message from Gordy Levan, MD sent at 08/16/2015  8:41 PM EDT ----- Labs seen and need follow up: please let her know ferritin is 60, so will need to set up phlebotomy in next several weeks. POF sent

## 2015-08-20 NOTE — Telephone Encounter (Signed)
lvm per Dr Edwyna Shell attached message. appt is already set up for 5/12 at 1 pm.

## 2015-08-20 NOTE — Telephone Encounter (Signed)
-----   Message from Gordy Levan, MD sent at 08/16/2015  8:41 PM EDT ----- Labs seen and need follow up: please let her know ferritin is 60, so will need to set up phlebotomy in next several weeks. POF sent

## 2015-08-23 ENCOUNTER — Ambulatory Visit: Payer: Medicare Other

## 2015-08-23 NOTE — Patient Instructions (Signed)
Therapeutic Phlebotomy, Care After  Refer to this sheet in the next few weeks. These instructions provide you with information about caring for yourself after your procedure. Your health care provider may also give you more specific instructions. Your treatment has been planned according to current medical practices, but problems sometimes occur. Call your health care provider if you have any problems or questions after your procedure.  WHAT TO EXPECT AFTER THE PROCEDURE  After your procedure, it is common to have:   Light-headedness or dizziness. You may feel faint.   Nausea.   Tiredness.  HOME CARE INSTRUCTIONS  Activities   Return to your normal activities as directed by your health care provider. Most people can go back to their normal activities right away.   Avoid strenuous physical activity and heavy lifting or pulling for about 5 hours after the procedure. Do not lift anything that is heavier than 10 lb (4.5 kg).   Athletes should avoid strenuous exercise for at least 12 hours.   Change positions slowly for the remainder of the day. This will help to prevent light-headedness or fainting.   If you feel light-headed, lie down until the feeling goes away.  Eating and Drinking   Be sure to eat well-balanced meals for the next 24 hours.   Drink enough fluid to keep your urine clear or pale yellow.   Avoid drinking alcohol on the day that you had the procedure.  Care of the Needle Insertion Site   Keep your bandage dry. You can remove the bandage after about 5 hours or as directed by your health care provider.   If you have bleeding from the needle insertion site, elevate your arm and press firmly on the site until the bleeding stops.   If you have bruising at the site, apply ice to the area:   Put ice in a plastic bag.   Place a towel between your skin and the bag.   Leave the ice on for 20 minutes, 2-3 times a day for the first 24 hours.   If the swelling does not go away after 24 hours, apply  a warm, moist washcloth to the area for 20 minutes, 2-3 times a day.  General Instructions   Avoid smoking for at least 30 minutes after the procedure.   Keep all follow-up visits as directed by your health care provider. It is important to continue with further therapeutic phlebotomy treatments as directed.  SEEK MEDICAL CARE IF:   You have redness, swelling, or pain at the needle insertion site.   You have fluid, blood, or pus coming from the needle insertion site.   You feel light-headed, dizzy, or nauseated, and the feeling does not go away.   You notice new bruising at the needle insertion site.   You feel weaker than normal.   You have a fever or chills.  SEEK IMMEDIATE MEDICAL CARE IF:   You have severe nausea or vomiting.   You have chest pain.   You have trouble breathing.    This information is not intended to replace advice given to you by your health care provider. Make sure you discuss any questions you have with your health care provider.    Document Released: 09/01/2010 Document Revised: 08/14/2014 Document Reviewed: 03/26/2014  Elsevier Interactive Patient Education 2016 [employer]

## 2015-09-18 ENCOUNTER — Other Ambulatory Visit: Payer: Self-pay | Admitting: Internal Medicine

## 2015-09-27 ENCOUNTER — Ambulatory Visit: Payer: Medicare Other | Admitting: Internal Medicine

## 2015-10-17 ENCOUNTER — Encounter (HOSPITAL_BASED_OUTPATIENT_CLINIC_OR_DEPARTMENT_OTHER): Payer: Medicare Other | Attending: Internal Medicine

## 2015-10-17 DIAGNOSIS — J449 Chronic obstructive pulmonary disease, unspecified: Secondary | ICD-10-CM | POA: Diagnosis not present

## 2015-10-17 DIAGNOSIS — I87332 Chronic venous hypertension (idiopathic) with ulcer and inflammation of left lower extremity: Secondary | ICD-10-CM | POA: Diagnosis not present

## 2015-10-17 DIAGNOSIS — I10 Essential (primary) hypertension: Secondary | ICD-10-CM | POA: Diagnosis not present

## 2015-10-17 DIAGNOSIS — L97821 Non-pressure chronic ulcer of other part of left lower leg limited to breakdown of skin: Secondary | ICD-10-CM | POA: Insufficient documentation

## 2015-10-17 DIAGNOSIS — Z9221 Personal history of antineoplastic chemotherapy: Secondary | ICD-10-CM | POA: Diagnosis not present

## 2015-10-17 DIAGNOSIS — S80812A Abrasion, left lower leg, initial encounter: Secondary | ICD-10-CM | POA: Diagnosis not present

## 2015-10-24 DIAGNOSIS — Z9221 Personal history of antineoplastic chemotherapy: Secondary | ICD-10-CM | POA: Diagnosis not present

## 2015-10-24 DIAGNOSIS — I10 Essential (primary) hypertension: Secondary | ICD-10-CM | POA: Diagnosis not present

## 2015-10-24 DIAGNOSIS — J449 Chronic obstructive pulmonary disease, unspecified: Secondary | ICD-10-CM | POA: Diagnosis not present

## 2015-10-24 DIAGNOSIS — L97821 Non-pressure chronic ulcer of other part of left lower leg limited to breakdown of skin: Secondary | ICD-10-CM | POA: Diagnosis not present

## 2015-10-24 DIAGNOSIS — I87332 Chronic venous hypertension (idiopathic) with ulcer and inflammation of left lower extremity: Secondary | ICD-10-CM | POA: Diagnosis not present

## 2015-10-24 DIAGNOSIS — S81812A Laceration without foreign body, left lower leg, initial encounter: Secondary | ICD-10-CM | POA: Diagnosis not present

## 2015-10-31 DIAGNOSIS — Z9221 Personal history of antineoplastic chemotherapy: Secondary | ICD-10-CM | POA: Diagnosis not present

## 2015-10-31 DIAGNOSIS — I87332 Chronic venous hypertension (idiopathic) with ulcer and inflammation of left lower extremity: Secondary | ICD-10-CM | POA: Diagnosis not present

## 2015-10-31 DIAGNOSIS — L97821 Non-pressure chronic ulcer of other part of left lower leg limited to breakdown of skin: Secondary | ICD-10-CM | POA: Diagnosis not present

## 2015-10-31 DIAGNOSIS — S81802D Unspecified open wound, left lower leg, subsequent encounter: Secondary | ICD-10-CM | POA: Diagnosis not present

## 2015-10-31 DIAGNOSIS — I10 Essential (primary) hypertension: Secondary | ICD-10-CM | POA: Diagnosis not present

## 2015-10-31 DIAGNOSIS — J449 Chronic obstructive pulmonary disease, unspecified: Secondary | ICD-10-CM | POA: Diagnosis not present

## 2015-11-12 ENCOUNTER — Ambulatory Visit (INDEPENDENT_AMBULATORY_CARE_PROVIDER_SITE_OTHER)
Admission: RE | Admit: 2015-11-12 | Discharge: 2015-11-12 | Disposition: A | Discharge status: Home / Self Care | Payer: Medicare Other | Source: Ambulatory Visit | Attending: Internal Medicine | Admitting: Internal Medicine

## 2015-11-12 ENCOUNTER — Ambulatory Visit (INDEPENDENT_AMBULATORY_CARE_PROVIDER_SITE_OTHER): Payer: Medicare Other | Admitting: Internal Medicine

## 2015-11-12 ENCOUNTER — Encounter: Payer: Self-pay | Admitting: Internal Medicine

## 2015-11-12 ENCOUNTER — Other Ambulatory Visit (INDEPENDENT_AMBULATORY_CARE_PROVIDER_SITE_OTHER): Payer: Medicare Other

## 2015-11-12 VITALS — BP 132/76 | HR 78 | Temp 98.6°F | Wt 104.0 lb

## 2015-11-12 DIAGNOSIS — R102 Pelvic and perineal pain: Secondary | ICD-10-CM

## 2015-11-12 DIAGNOSIS — F411 Generalized anxiety disorder: Secondary | ICD-10-CM

## 2015-11-12 DIAGNOSIS — R079 Chest pain, unspecified: Secondary | ICD-10-CM | POA: Diagnosis not present

## 2015-11-12 DIAGNOSIS — J449 Chronic obstructive pulmonary disease, unspecified: Secondary | ICD-10-CM | POA: Insufficient documentation

## 2015-11-12 DIAGNOSIS — R1084 Generalized abdominal pain: Secondary | ICD-10-CM | POA: Diagnosis not present

## 2015-11-12 LAB — URINALYSIS, ROUTINE W REFLEX MICROSCOPIC
BILIRUBIN URINE: NEGATIVE
Hgb urine dipstick: NEGATIVE
KETONES UR: NEGATIVE
LEUKOCYTES UA: NEGATIVE
Nitrite: NEGATIVE
PH: 6.5 (ref 5.0–8.0)
RBC / HPF: NONE SEEN (ref 0–?)
Specific Gravity, Urine: <=1.005 — AB (ref 1.000–1.030)
TOTAL PROTEIN, URINE-UPE24: NEGATIVE
URINE GLUCOSE: NEGATIVE
UROBILINOGEN UA: 0.2 (ref 0.0–1.0)
WBC, UA: NONE SEEN (ref 0–?)

## 2015-11-12 LAB — LIPASE: Lipase: 46 U/L (ref 11.0–59.0)

## 2015-11-12 NOTE — Progress Notes (Signed)
Subjective:    Patient ID: Brandy Peterson, female    DOB: July 04, 1930, 82 y.o.   MRN: [id number]  HPI  Here to f/u, has seen Dr Excell Seltzer for f/u breast ca, c/o abd distension, "just couldn't get it in", decided against a CT but is reconsidering and may make another appt. Denies worsening reflux, dysphagia, n/v, bowel change or blood, but does have achy type intermittent discomfort "not a pain" to the mid and upper abd for several months.  Most recently seems more epigastric, nagging, with occas fleeting sharp pains radiating up the right chest toward the neck.  Belching and passing gas seemes to help at times, but clothes seem tight and "just cant stand it" "sometimes seems almost scary."  Wondering if she need chest/abd/pelvic ct as well.   Pt denies fever, wt loss, night sweats, loss of appetite, or other constitutional symptoms  Mother had pancreatic ca , and siblings with hx of heart disease.  Is allergic to contrast,  Wt Readings from Last 3 Encounters:  11/12/15 104 lb (47.2 kg)  06/10/15 108 lb (49 kg)  05/28/15 108 lb (49 kg)   BP Readings from Last 3 Encounters:  11/12/15 132/76  08/23/15 (!) 150/65  06/10/15 136/62  Has seen Dr Charolotte Capuchin, but feels like "they damaged my throat" one time and does not want colonoscopy after mother had punctured intestines., declines GI referral. "I wan to find out what is going on, but not with a bunch of tests " - fears side effects and compilications.  Has hx of IBS and constipation, but "this is not the same"  "remember I also have labs on Friday scheduled" - refuses to duplicate labs Past Medical History:  Diagnosis Date  . Backache, unspecified   . Carcinoma of breast ([place])    Right  . Complication of anesthesia    was told to never use Sodium Pentothal - mother had reaction. Also had trouble with intubation during mastectomy  . COPD (chronic obstructive pulmonary disease) ([place])   . Family history of adverse reaction to anesthesia   . GERD  (gastroesophageal reflux disease)   . Hemochromatosis   . History of shingles   . Hypertension   . Hypothyroidism   . IBS (irritable bowel syndrome)   . Plantar fasciitis   . Pneumonia   . Raynaud's syndrome   . Spastic colon    hx of   . Thyroid disease   . Varicose veins    Past Surgical History:  Procedure Laterality Date  . ABDOMINAL HYSTERECTOMY  1979   PARTIAL  . APPENDECTOMY    . BONE SPUR  1970   BOTH FEET  . CATARACT EXTRACTION  10/2012 and 12/2012   bilateral with lens implants  . COLONOSCOPY    . KYPHOPLASTY N/A 10/19/2014   Procedure: Lumbar 1 kyphoplasty;  Surgeon: Jovita Gamma, MD;  Location: [facility];  Service: Neurosurgery;  Laterality: N/A;  . LUMBAR LAMINECTOMY  1991   L5-S1  . MASTECTOMY  1994   RIGHT  . MASTECTOMY  1994   Right    reports that she quit smoking about 27 years ago. Her smoking use included Cigarettes. She has never used smokeless tobacco. She reports that she drinks about 1.8 oz of alcohol per week . She reports that she does not use drugs. family history includes Arthritis in her father; Bleeding Disorder in her mother; Cancer in her mother; Deep vein thrombosis in her father; Heart attack in her brother and father;  Heart disease in her brother and father; Hemochromatosis in her brother and father; Hyperlipidemia in her son; Hypertension in her father and mother; Hypothyroidism in her mother; Osteoarthritis in her mother; Varicose Veins in her father and mother. Allergies  Allergen Reactions  . Contrast Media [Iodinated Diagnostic Agents] Hives, Itching and Other (See Comments)    Felt like she was going to faint   . Adhesive [Tape] Other (See Comments)    Prefers paper tape  . Sulfonamide Derivatives Other (See Comments)    Reaction: unknown    Current Outpatient Prescriptions on File Prior to Visit  Medication Sig Dispense Refill  . Biotin 5000 MCG CAPS Take 1 capsule (5,000 mcg total) by mouth daily. 30 capsule   .  cholecalciferol (VITAMIN D) 1000 units tablet Take 1,000 Units by mouth daily.    . fluticasone (FLONASE) 50 MCG/ACT nasal spray Place 1 spray into both nostrils daily. 48 g 3  . levothyroxine (SYNTHROID, LEVOTHROID) 100 MCG tablet Take 1 tablet (100 mcg total) by mouth daily. 90 tablet 3  . omeprazole (PRILOSEC) 20 MG capsule Take 20 mg by mouth daily as needed (heartburn).     . triamterene-hydrochlorothiazide (MAXZIDE-25) 37.5-25 MG tablet TAKE ONE TABLET BY MOUTH ONCE DAILY 90 tablet 0   No current facility-administered medications on file prior to visit.    Review of Systems  Constitutional: Negative for unusual diaphoresis or night sweats HENT: Negative for ear swelling or discharge Eyes: Negative for worsening visual haziness  Respiratory: Negative for choking and stridor.   Gastrointestinal: Negative for distension or worsening eructation Genitourinary: Negative for retention or change in urine volume.  Musculoskeletal: Negative for other MSK pain or swelling Skin: Negative for color change and worsening wound Neurological: Negative for tremors and numbness other than noted  Psychiatric/Behavioral: Negative for decreased concentration or agitation other than above       Objective:   Physical Exam BP 132/76   Pulse 78   Temp 98.6 F (37 C) (Oral)   Wt 104 lb (47.2 kg)   SpO2 96%   BMI 19.65 kg/m  VS noted,  Constitutional: Pt appears in no apparent distress HENT: Head: NCAT.  Right Ear: External ear normal.  Left Ear: External ear normal.  Eyes: . Pupils are equal, round, and reactive to light. Conjunctivae and EOM are normal Neck: Normal range of motion. Neck supple.  Cardiovascular: Normal rate and regular rhythm.   Pulmonary/Chest: Effort normal and breath sounds without rales or wheezing.  Abd:  Soft, NT, ND, + BS Neurological: Pt is alert. Not confused , motor grossly intact Skin: Skin is warm. No rash, no LE edema Psychiatric: Pt behavior is normal. No  agitation. 2-3+ nervous  ECG today I have personally reviewed Sinus  Rhythm  - frequent PAC s  # PACs = 2. Low voltage in precordial leads.   -Incomplete right bundle branch block and left axis -anterior fascicular block.     Assessment & Plan:

## 2015-11-12 NOTE — Patient Instructions (Signed)
Your EKG was OK today  Please continue all other medications as before, and refills have been done if requested.  Please have the pharmacy call with any other refills you may need.  Please continue your efforts at being more active, low cholesterol diet, and weight control.  You will be contacted regarding the referral for: MRI for the abd and pelvis  Please keep your appointments with your specialists as you may have planned  Please go to the XRAY Department in the Basement (go straight as you get off the elevator) for the x-ray testing  Please go to the LAB in the Basement (turn left off the elevator) for the tests to be done today  You will be contacted by phone if any changes need to be made immediately.  Otherwise, you will receive a letter about your results with an explanation, but please check with MyChart first.  Please remember to sign up for MyChart if you have not done so, as this will be important to you in the future with finding out test results, communicating by private email, and scheduling acute appointments online when needed.

## 2015-11-12 NOTE — Progress Notes (Signed)
Pre visit review using our clinic review tool, if applicable. No additional management support is needed unless otherwise documented below in the visit note. 

## 2015-11-13 DIAGNOSIS — L570 Actinic keratosis: Secondary | ICD-10-CM | POA: Diagnosis not present

## 2015-11-13 DIAGNOSIS — D485 Neoplasm of uncertain behavior of skin: Secondary | ICD-10-CM | POA: Diagnosis not present

## 2015-11-13 DIAGNOSIS — L821 Other seborrheic keratosis: Secondary | ICD-10-CM | POA: Diagnosis not present

## 2015-11-14 ENCOUNTER — Other Ambulatory Visit: Payer: Medicare Other

## 2015-11-14 ENCOUNTER — Telehealth: Payer: Self-pay | Admitting: Emergency Medicine

## 2015-11-14 ENCOUNTER — Ambulatory Visit: Payer: Medicare Other | Admitting: Oncology

## 2015-11-15 ENCOUNTER — Other Ambulatory Visit (HOSPITAL_BASED_OUTPATIENT_CLINIC_OR_DEPARTMENT_OTHER): Payer: Medicare Other

## 2015-11-15 DIAGNOSIS — C44629 Squamous cell carcinoma of skin of left upper limb, including shoulder: Secondary | ICD-10-CM | POA: Diagnosis not present

## 2015-11-15 LAB — CBC WITH DIFFERENTIAL/PLATELET
BASO%: 0.7 % (ref 0.0–2.0)
Basophils Absolute: 0.1 10*3/uL (ref 0.0–0.1)
EOS ABS: 0.3 10*3/uL (ref 0.0–0.5)
EOS%: 4 % (ref 0.0–7.0)
HCT: 44.3 % (ref 34.8–46.6)
HEMOGLOBIN: 15.1 g/dL (ref 11.6–15.9)
LYMPH#: 1.2 10*3/uL (ref 0.9–3.3)
LYMPH%: 17.2 % (ref 14.0–49.7)
MCH: 31.7 pg (ref 25.1–34.0)
MCHC: 34 g/dL (ref 31.5–36.0)
MCV: 93.2 fL (ref 79.5–101.0)
MONO#: 0.6 10*3/uL (ref 0.1–0.9)
MONO%: 8.8 % (ref 0.0–14.0)
NEUT#: 5 10*3/uL (ref 1.5–6.5)
NEUT%: 69.3 % (ref 38.4–76.8)
Platelets: 250 10*3/uL (ref 145–400)
RBC: 4.75 10*6/uL (ref 3.70–5.45)
RDW: 12.4 % (ref 11.2–14.5)
WBC: 7.2 10*3/uL (ref 3.9–10.3)

## 2015-11-15 LAB — COMPREHENSIVE METABOLIC PANEL
ALT: 12 U/L (ref 0–55)
ANION GAP: 10 meq/L (ref 3–11)
AST: 18 U/L (ref 5–34)
Albumin: 3.7 g/dL (ref 3.5–5.0)
Alkaline Phosphatase: 82 U/L (ref 40–150)
BUN: 17.2 mg/dL (ref 7.0–26.0)
CHLORIDE: 103 meq/L (ref 98–109)
CO2: 25 meq/L (ref 22–29)
Calcium: 9.3 mg/dL (ref 8.4–10.4)
Creatinine: 0.8 mg/dL (ref 0.6–1.1)
EGFR: 66 mL/min/{1.73_m2} — AB (ref >90)
Glucose: 91 mg/dl (ref 70–140)
POTASSIUM: 4.3 meq/L (ref 3.5–5.1)
Sodium: 138 mEq/L (ref 136–145)
Total Bilirubin: 0.56 mg/dL (ref 0.20–1.20)
Total Protein: 6.8 g/dL (ref 6.4–8.3)

## 2015-11-15 LAB — FERRITIN: FERRITIN: 37 ng/mL (ref 9–269)

## 2015-11-17 DIAGNOSIS — F411 Generalized anxiety disorder: Secondary | ICD-10-CM | POA: Insufficient documentation

## 2015-11-17 NOTE — Assessment & Plan Note (Addendum)
Etiology unclear, Mild to mod persistent, has CT dye allergy and declines further, for MRI abd/pelvis,  to f/u any worsening symptoms or concerns  Note:  Total time for pt hx, exam, review of record with pt in the room, determination of diagnoses and plan for further eval and tx is > 40 min, with over 50% spent in coordination and counseling of patient

## 2015-11-17 NOTE — Assessment & Plan Note (Signed)
Mod to severe today, poor insight, declines further tx consideration

## 2015-11-17 NOTE — Assessment & Plan Note (Addendum)
Radiating pain fro abd, for cxr, ecg reviewed, pt is upset I am not ordering MRI chest as well,  to f/u any worsening symptoms or concerns

## 2015-11-17 NOTE — Assessment & Plan Note (Signed)
Etiology unclear, for MRI pelvis, r/o mass

## 2015-11-18 NOTE — Telephone Encounter (Signed)
o

## 2015-11-19 ENCOUNTER — Telehealth: Payer: Self-pay

## 2015-11-19 NOTE — Telephone Encounter (Signed)
LM in Ms. Scheel's voicemail stating the information as noted below by Dr. Marko Plume.   Requested that Ms Kellman call back to let Dr. Mariana Kaufman nurse know if she will keep appointment on 11-21-15 or cancel and schedule labs in ~27months.

## 2015-11-19 NOTE — Telephone Encounter (Signed)
-----   Message from Gordy Levan, MD sent at 11/19/2015  8:29 AM EDT ----- Labs seen and need follow up: ferritin good at 37. Still ok for her to see me for "yearly" visit on 8-10 if she would like, otherwise would repeat ferritin in ~ 4 mo

## 2015-11-19 NOTE — Telephone Encounter (Signed)
Ms Vaupel called back and stated that she has seen a lot of her probationers recently and does not feel she needs to keep appointment with Dr. Marko Plume on Thursday 11-21-15. Ok to schedule repeat labs in ~4 months. Cancelled appointment with Dr. Marko Plume for 11-21-15. Ms Shortt is scheduled for labs on Friday 03-13-16 at 1100.

## 2015-11-21 ENCOUNTER — Ambulatory Visit: Payer: Medicare Other | Admitting: Oncology

## 2015-11-26 ENCOUNTER — Other Ambulatory Visit: Payer: Self-pay | Admitting: Internal Medicine

## 2015-11-26 ENCOUNTER — Ambulatory Visit
Admission: RE | Admit: 2015-11-26 | Discharge: 2015-11-26 | Disposition: A | Discharge status: Home / Self Care | Payer: Medicare Other | Source: Ambulatory Visit | Attending: Internal Medicine | Admitting: Internal Medicine

## 2015-11-26 ENCOUNTER — Telehealth: Payer: Self-pay | Admitting: *Deleted

## 2015-11-26 DIAGNOSIS — R1084 Generalized abdominal pain: Secondary | ICD-10-CM

## 2015-11-26 DIAGNOSIS — R102 Pelvic and perineal pain: Secondary | ICD-10-CM

## 2015-11-26 NOTE — Telephone Encounter (Signed)
Rec'd call from rhonda stating pt is there to have a MRI/MRA. MD ordered w/contrast pt is stating that she is allergic to contrast and it should be on her chart. Suanne Marker inform pt the contrast that is listed is not the same, and that the last MRI that was order back in 2003 was w/contrast. Pt stated they premedicated her too before the test. Suanne Marker is wanting to know is there anything back in the chart where md order premedicated med back in 2003. Inform Rhonda per chart last MRI was order by a Dr. Percell Miller and not Dr. Jenny Reichmann. She then states she may have to reschedule the patient...Johny Chess

## 2015-12-03 ENCOUNTER — Encounter: Payer: Self-pay | Admitting: Internal Medicine

## 2015-12-03 ENCOUNTER — Ambulatory Visit
Admission: RE | Admit: 2015-12-03 | Discharge: 2015-12-03 | Disposition: A | Discharge status: Home / Self Care | Payer: Medicare Other | Source: Ambulatory Visit | Attending: Internal Medicine | Admitting: Internal Medicine

## 2015-12-03 DIAGNOSIS — R1084 Generalized abdominal pain: Secondary | ICD-10-CM

## 2015-12-03 DIAGNOSIS — R102 Pelvic and perineal pain: Secondary | ICD-10-CM

## 2015-12-03 MED ORDER — GADOBENATE DIMEGLUMINE 529 MG/ML IV SOLN
9.0000 mL | Freq: Once | INTRAVENOUS | Status: AC | PRN
  Administered 2015-12-03: 9 mL via INTRAVENOUS

## 2015-12-23 DIAGNOSIS — C44629 Squamous cell carcinoma of skin of left upper limb, including shoulder: Secondary | ICD-10-CM | POA: Diagnosis not present

## 2015-12-24 ENCOUNTER — Encounter: Payer: Self-pay | Admitting: Internal Medicine

## 2016-01-02 DIAGNOSIS — Z23 Encounter for immunization: Secondary | ICD-10-CM | POA: Diagnosis not present

## 2016-01-02 DIAGNOSIS — L570 Actinic keratosis: Secondary | ICD-10-CM | POA: Diagnosis not present

## 2016-01-02 DIAGNOSIS — Z85828 Personal history of other malignant neoplasm of skin: Secondary | ICD-10-CM | POA: Diagnosis not present

## 2016-01-02 DIAGNOSIS — L821 Other seborrheic keratosis: Secondary | ICD-10-CM | POA: Diagnosis not present

## 2016-01-02 DIAGNOSIS — L814 Other melanin hyperpigmentation: Secondary | ICD-10-CM | POA: Diagnosis not present

## 2016-01-02 DIAGNOSIS — D225 Melanocytic nevi of trunk: Secondary | ICD-10-CM | POA: Diagnosis not present

## 2016-01-02 DIAGNOSIS — R140 Abdominal distension (gaseous): Secondary | ICD-10-CM | POA: Diagnosis not present

## 2016-01-02 DIAGNOSIS — D1801 Hemangioma of skin and subcutaneous tissue: Secondary | ICD-10-CM | POA: Diagnosis not present

## 2016-01-20 DIAGNOSIS — Z961 Presence of intraocular lens: Secondary | ICD-10-CM | POA: Diagnosis not present

## 2016-01-21 ENCOUNTER — Ambulatory Visit (INDEPENDENT_AMBULATORY_CARE_PROVIDER_SITE_OTHER): Payer: Medicare Other

## 2016-01-21 DIAGNOSIS — Z23 Encounter for immunization: Secondary | ICD-10-CM | POA: Diagnosis not present

## 2016-02-13 ENCOUNTER — Other Ambulatory Visit: Payer: Self-pay | Admitting: Internal Medicine

## 2016-02-19 DIAGNOSIS — Z23 Encounter for immunization: Secondary | ICD-10-CM | POA: Diagnosis not present

## 2016-02-19 DIAGNOSIS — L570 Actinic keratosis: Secondary | ICD-10-CM | POA: Diagnosis not present

## 2016-02-19 DIAGNOSIS — L820 Inflamed seborrheic keratosis: Secondary | ICD-10-CM | POA: Diagnosis not present

## 2016-02-24 ENCOUNTER — Other Ambulatory Visit (HOSPITAL_COMMUNITY): Payer: Self-pay | Admitting: Gastroenterology

## 2016-02-24 DIAGNOSIS — R140 Abdominal distension (gaseous): Secondary | ICD-10-CM | POA: Diagnosis not present

## 2016-02-24 DIAGNOSIS — R1310 Dysphagia, unspecified: Secondary | ICD-10-CM

## 2016-03-04 ENCOUNTER — Ambulatory Visit (HOSPITAL_COMMUNITY)
Admission: RE | Admit: 2016-03-04 | Discharge: 2016-03-04 | Disposition: A | Discharge status: Home / Self Care | Payer: Medicare Other | Source: Ambulatory Visit | Attending: Gastroenterology | Admitting: Gastroenterology

## 2016-03-04 DIAGNOSIS — K224 Dyskinesia of esophagus: Secondary | ICD-10-CM | POA: Insufficient documentation

## 2016-03-04 DIAGNOSIS — R1310 Dysphagia, unspecified: Secondary | ICD-10-CM

## 2016-03-04 DIAGNOSIS — R1010 Upper abdominal pain, unspecified: Secondary | ICD-10-CM | POA: Diagnosis not present

## 2016-03-13 ENCOUNTER — Ambulatory Visit (HOSPITAL_BASED_OUTPATIENT_CLINIC_OR_DEPARTMENT_OTHER): Payer: Medicare Other

## 2016-03-13 LAB — FERRITIN: FERRITIN: 63 ng/mL (ref 9–269)

## 2016-03-16 ENCOUNTER — Telehealth: Payer: Self-pay

## 2016-03-16 NOTE — Telephone Encounter (Signed)
LVM per Dr Mariana Kaufman attached note. In basket sent to scheduler.

## 2016-03-16 NOTE — Telephone Encounter (Signed)
-----   Message from Gordy Levan, MD sent at 03/16/2016  3:23 PM EST ----- Labs seen and need follow up: ferritin 63, prefer to keep this <50.  Please set up phlebotomy in next few weeks. Repeat ferritin ~ 4 weeks later in Jan, watch for results, same parameters

## 2016-03-18 ENCOUNTER — Telehealth: Payer: Self-pay | Admitting: Oncology

## 2016-03-18 NOTE — Telephone Encounter (Signed)
lvm to inform pt of Dec and Jan 2018 appt date/times per LOS

## 2016-03-19 ENCOUNTER — Telehealth: Payer: Self-pay

## 2016-03-19 NOTE — Telephone Encounter (Signed)
Pt unable to make appt on 12/22 for phlebotomy. She is out of town. She prefers 12/21 or earler (not 18th if possible). In basket sent.

## 2016-03-23 DIAGNOSIS — R140 Abdominal distension (gaseous): Secondary | ICD-10-CM | POA: Diagnosis not present

## 2016-03-23 DIAGNOSIS — R1310 Dysphagia, unspecified: Secondary | ICD-10-CM | POA: Diagnosis not present

## 2016-04-01 ENCOUNTER — Ambulatory Visit (HOSPITAL_BASED_OUTPATIENT_CLINIC_OR_DEPARTMENT_OTHER): Payer: Medicare Other

## 2016-04-01 ENCOUNTER — Other Ambulatory Visit: Payer: Medicare Other

## 2016-04-01 NOTE — Patient Instructions (Signed)
Therapeutic Phlebotomy, Care After  Refer to this sheet in the next few weeks. These instructions provide you with information about caring for yourself after your procedure. Your health care provider may also give you more specific instructions. Your treatment has been planned according to current medical practices, but problems sometimes occur. Call your health care provider if you have any problems or questions after your procedure.  WHAT TO EXPECT AFTER THE PROCEDURE  After your procedure, it is common to have:   Light-headedness or dizziness. You may feel faint.   Nausea.   Tiredness.  HOME CARE INSTRUCTIONS  Activities   Return to your normal activities as directed by your health care provider. Most people can go back to their normal activities right away.   Avoid strenuous physical activity and heavy lifting or pulling for about 5 hours after the procedure. Do not lift anything that is heavier than 10 lb (4.5 kg).   Athletes should avoid strenuous exercise for at least 12 hours.   Change positions slowly for the remainder of the day. This will help to prevent light-headedness or fainting.   If you feel light-headed, lie down until the feeling goes away.  Eating and Drinking   Be sure to eat well-balanced meals for the next 24 hours.   Drink enough fluid to keep your urine clear or pale yellow.   Avoid drinking alcohol on the day that you had the procedure.  Care of the Needle Insertion Site   Keep your bandage dry. You can remove the bandage after about 5 hours or as directed by your health care provider.   If you have bleeding from the needle insertion site, elevate your arm and press firmly on the site until the bleeding stops.   If you have bruising at the site, apply ice to the area:   Put ice in a plastic bag.   Place a towel between your skin and the bag.   Leave the ice on for 20 minutes, 2-3 times a day for the first 24 hours.   If the swelling does not go away after 24 hours, apply  a warm, moist washcloth to the area for 20 minutes, 2-3 times a day.  General Instructions   Avoid smoking for at least 30 minutes after the procedure.   Keep all follow-up visits as directed by your health care provider. It is important to continue with further therapeutic phlebotomy treatments as directed.  SEEK MEDICAL CARE IF:   You have redness, swelling, or pain at the needle insertion site.   You have fluid, blood, or pus coming from the needle insertion site.   You feel light-headed, dizzy, or nauseated, and the feeling does not go away.   You notice new bruising at the needle insertion site.   You feel weaker than normal.   You have a fever or chills.  SEEK IMMEDIATE MEDICAL CARE IF:   You have severe nausea or vomiting.   You have chest pain.   You have trouble breathing.    This information is not intended to replace advice given to you by your health care provider. Make sure you discuss any questions you have with your health care provider.    Document Released: 09/01/2010 Document Revised: 08/14/2014 Document Reviewed: 03/26/2014  Elsevier Interactive Patient Education 2016 [employer]

## 2016-04-01 NOTE — Progress Notes (Signed)
Phlebotomy. 500gm  pulled from patient with 16g PIV to Left AC. Procedure started at 1145 and ended at 1155. Patient given snack and encouraged fluids. Observed for 30 minutes afterwards. Vital Signs stable.

## 2016-04-16 ENCOUNTER — Telehealth: Payer: Self-pay | Admitting: Internal Medicine

## 2016-04-16 ENCOUNTER — Encounter: Payer: Self-pay | Admitting: Internal Medicine

## 2016-04-16 ENCOUNTER — Telehealth: Payer: Self-pay

## 2016-04-16 DIAGNOSIS — E079 Disorder of thyroid, unspecified: Secondary | ICD-10-CM

## 2016-04-16 NOTE — Telephone Encounter (Signed)
I cannot tell what her insurance is, so not sure if can be ordered aheadl. Traditional medicare will not pay for this, but other payers will. thanks

## 2016-04-16 NOTE — Telephone Encounter (Signed)
Spoke to patient I advised her that we could order the test but depending on her insurance she may get a bill for it. Patient was upset about possibly getting a bill if her insurance didn't pay for it. Patient states that she will wait until the day of her visit.

## 2016-04-16 NOTE — Telephone Encounter (Signed)
Patient is asking that a lab order be placed for thyroid tests so that she can have it done before hand. Her appt is set for 04/30/2016. Please call patient to advise ready

## 2016-04-16 NOTE — Telephone Encounter (Signed)
Lab order placed.

## 2016-04-21 ENCOUNTER — Other Ambulatory Visit (INDEPENDENT_AMBULATORY_CARE_PROVIDER_SITE_OTHER): Payer: Medicare Other

## 2016-04-21 DIAGNOSIS — E079 Disorder of thyroid, unspecified: Secondary | ICD-10-CM

## 2016-04-21 LAB — TSH: TSH: 2.05 u[IU]/mL (ref 0.35–4.50)

## 2016-04-24 ENCOUNTER — Other Ambulatory Visit: Payer: Medicare Other

## 2016-04-30 ENCOUNTER — Ambulatory Visit: Payer: Medicare Other | Admitting: Internal Medicine

## 2016-05-01 ENCOUNTER — Encounter: Payer: Self-pay | Admitting: Internal Medicine

## 2016-05-01 ENCOUNTER — Other Ambulatory Visit (HOSPITAL_BASED_OUTPATIENT_CLINIC_OR_DEPARTMENT_OTHER): Payer: Medicare Other

## 2016-05-01 ENCOUNTER — Telehealth: Payer: Self-pay

## 2016-05-01 LAB — FERRITIN: FERRITIN: 40 ng/mL (ref 9–269)

## 2016-05-01 NOTE — Telephone Encounter (Signed)
Told Brandy Peterson the results of the Ferritin level as noted below.  Told her that Dr. Marko Plume is recommending Dr. Irene Limbo as new Medical Oncologist as noted below.   Brandy Dileonardo is agreeable to this plan.

## 2016-05-01 NOTE — Telephone Encounter (Signed)
-----   Message from Gordy Levan, MD sent at 05/01/2016  6:23 PM EST ----- Regarding: RE: Pt future follow up/lab I am putting in scheduling message for lab and Dr Irene Limbo in June RN please let her know ferritin is good and that I recommend that we have Dr Irene Limbo meet her and follow thanks ----- Message ----- From: Baruch Merl, RN Sent: 05/01/2016   6:00 PM To: Gordy Levan, MD Subject: Pt future follow up/lab                        Ms Arrazola's ferritin level from today 05-01-16 is good at 40. She has no future lab appointments or provider visit. She was last seen by Dr. Marko Plume on 10-04-14. Pt cancelled office visit in 11/2015 Will call her next week with lab result and future plan.   Thanks, Barbaraann Share

## 2016-05-04 ENCOUNTER — Telehealth: Payer: Self-pay | Admitting: Hematology

## 2016-05-04 NOTE — Telephone Encounter (Signed)
Spoke with patient re lab/fu with Dr. Irene Limbo in June per 1/19 sch msg. Former [facility] patient.

## 2016-05-13 ENCOUNTER — Ambulatory Visit (INDEPENDENT_AMBULATORY_CARE_PROVIDER_SITE_OTHER): Payer: Medicare Other | Admitting: Internal Medicine

## 2016-05-13 VITALS — BP 130/76 | HR 75 | Temp 98.0°F | Resp 20 | Wt 107.0 lb

## 2016-05-13 DIAGNOSIS — E039 Hypothyroidism, unspecified: Secondary | ICD-10-CM | POA: Diagnosis not present

## 2016-05-13 DIAGNOSIS — I10 Essential (primary) hypertension: Secondary | ICD-10-CM

## 2016-05-13 DIAGNOSIS — R079 Chest pain, unspecified: Secondary | ICD-10-CM

## 2016-05-13 MED ORDER — FLUTICASONE PROPIONATE 50 MCG/ACT NA SUSP: 1.0000 | Freq: Every day | NASAL | 3 refills | Status: DC

## 2016-05-13 MED ORDER — LEVOTHYROXINE SODIUM 100 MCG PO TABS: 100.0000 ug | ORAL_TABLET | Freq: Every day | ORAL | 3 refills | Status: DC

## 2016-05-13 MED ORDER — OMEPRAZOLE 20 MG PO CPDR: 20.0000 mg | DELAYED_RELEASE_CAPSULE | Freq: Every day | ORAL | 3 refills | Status: DC | PRN

## 2016-05-13 MED ORDER — TRIAMTERENE-HCTZ 37.5-25 MG PO TABS: 1.0000 | ORAL_TABLET | Freq: Every day | ORAL | 3 refills | Status: DC

## 2016-05-13 NOTE — Progress Notes (Signed)
Subjective:    Patient ID: Brandy Peterson, female    DOB: [birth year], 83 y.o.   MRN: [id number]  HPI  Here for yearly f/u;  Overall doing ok;  Pt denies, worsening SOB, DOE, wheezing, orthopnea, PND, worsening LE edema, palpitations, dizziness or syncope, but has recurring diffuse chest pain for minutes off and non last 2-3 wks, with some radiation to the right neck, but no diaphoresis, n/v.  No prior hx of CAD. Last stress test > 5 yrs.  Does not feel she can walk on a treadmill safely.  Pt denies neurological change such as new headache, facial or extremity weakness.  Pt denies polydipsia, polyuria, or low sugar symptoms. Pt states overall good compliance with treatment and medications, good tolerability, and has been trying to follow appropriate diet.  Pt denies worsening depressive symptoms, suicidal ideation or panic. No fever, night sweats, wt loss, loss of appetite, or other constitutional symptoms.  Pt states good ability with ADL's, has low fall risk, home safety reviewed and adequate, no other significant changes in hearing or vision, and only occasionally active with exercise. Declines dxa, as she did not tolerate fosamax in the past, and does not want to start prolia. Still sees Dr Marko Plume for iron disease.  No other new history Past Medical History:  Diagnosis Date  . Backache, unspecified   . Carcinoma of breast ([place])    Right  . Complication of anesthesia    was told to never use Sodium Pentothal - mother had reaction. Also had trouble with intubation during mastectomy  . COPD (chronic obstructive pulmonary disease) ([place])   . Family history of adverse reaction to anesthesia   . GERD (gastroesophageal reflux disease)   . Hemochromatosis   . History of shingles   . Hypertension   . Hypothyroidism   . IBS (irritable bowel syndrome)   . Plantar fasciitis   . Pneumonia   . Raynaud's syndrome   . Spastic colon    hx of   . Thyroid disease   . Varicose veins    Past Surgical  History:  Procedure Laterality Date  . ABDOMINAL HYSTERECTOMY  1979   PARTIAL  . APPENDECTOMY    . BONE SPUR  1970   BOTH FEET  . CATARACT EXTRACTION  10/2012 and 12/2012   bilateral with lens implants  . COLONOSCOPY    . KYPHOPLASTY N/A 10/19/2014   Procedure: Lumbar 1 kyphoplasty;  Surgeon: Jovita Gamma, MD;  Location: [facility];  Service: Neurosurgery;  Laterality: N/A;  . LUMBAR LAMINECTOMY  1991   L5-S1  . MASTECTOMY  1994   RIGHT  . MASTECTOMY  1994   Right    reports that she quit smoking about 28 years ago. Her smoking use included Cigarettes. She has never used smokeless tobacco. She reports that she drinks about 1.8 oz of alcohol per week . She reports that she does not use drugs. family history includes Arthritis in her father; Bleeding Disorder in her mother; Cancer in her mother; Deep vein thrombosis in her father; Heart attack in her brother and father; Heart disease in her brother and father; Hemochromatosis in her brother and father; Hyperlipidemia in her son; Hypertension in her father and mother; Hypothyroidism in her mother; Osteoarthritis in her mother; Varicose Veins in her father and mother. Allergies  Allergen Reactions  . Contrast Media [Iodinated Diagnostic Agents] Hives, Itching and Other (See Comments)    Felt like she was going to faint   . Adhesive [  Tape] Other (See Comments)    Prefers paper tape  . Sulfonamide Derivatives Other (See Comments)    Reaction: unknown    Current Outpatient Prescriptions on File Prior to Visit  Medication Sig Dispense Refill  . Biotin 5000 MCG CAPS Take 1 capsule (5,000 mcg total) by mouth daily. 30 capsule   . cholecalciferol (VITAMIN D) 1000 units tablet Take 1,000 Units by mouth daily.    . fluticasone (FLONASE) 50 MCG/ACT nasal spray Place 1 spray into both nostrils daily. 48 g 3  . levothyroxine (SYNTHROID, LEVOTHROID) 100 MCG tablet TAKE ONE TABLET BY MOUTH ONCE DAILY 90 tablet 3  . omeprazole (PRILOSEC) 20 MG  capsule Take 20 mg by mouth daily as needed (heartburn).     . triamterene-hydrochlorothiazide (MAXZIDE-25) 37.5-25 MG tablet TAKE ONE TABLET BY MOUTH ONCE DAILY 90 tablet 0   No current facility-administered medications on file prior to visit.    Review of Systems  Constitutional: Negative for unusual diaphoresis or night sweats HENT: Negative for ear swelling or discharge Eyes: Negative for worsening visual haziness  Respiratory: Negative for choking and stridor.   Gastrointestinal: Negative for distension or worsening eructation Genitourinary: Negative for retention or change in urine volume.  Musculoskeletal: Negative for other MSK pain or swelling Skin: Negative for color change and worsening wound Neurological: Negative for tremors and numbness other than noted  Psychiatric/Behavioral: Negative for decreased concentration or agitation other than above   All other system neg per pt    Objective:   Physical Exam BP 130/76   Pulse 75   Temp 98 F (36.7 C) (Oral)   Resp 20   Wt 107 lb (48.5 kg)   SpO2 95%   BMI 20.22 kg/m  VS noted,  + alopecia with wig, thin, more frail Constitutional: Pt is oriented to person, place, and time. Appears well-developed and well-nourished, in no significant distress Head: Normocephalic and atraumatic  Eyes: Conjunctivae and EOM are normal. Pupils are equal, round, and reactive to light Right Ear: External ear normal.  Left Ear: External ear normal Nose: Nose normal.  Mouth/Throat: Oropharynx is clear and moist  Neck: Normal range of motion. Neck supple. No JVD present. No tracheal deviation present or significant neck LA or mass Cardiovascular: Normal rate, regular rhythm, normal heart sounds and intact distal pulses.   Pulmonary/Chest: Effort normal and breath sounds decreased without rales or wheezing  Abdominal: Ssymptomsoft. Bowel sounds are normal. NT. No HSM  Musculoskeletal: Normal range of motion. Exhibits no edema Lymphadenopathy:  Has no cervical adenopathy.  Neurological: Pt is alert and oriented to person, place, and time. Pt has normal reflexes. No cranial nerve deficit. Motor grossly intact Skin: Skin is warm and dry. No rash noted or new ulcers Psychiatric:  Has normal mood and affect. Behavior is normal.  No other new exam findings    Assessment & Plan:

## 2016-05-13 NOTE — Patient Instructions (Signed)
Please continue all other medications as before, and refills have been done if requested - done to [facility]  Please have the pharmacy call with any other refills you may need.  Please continue your efforts at being more active, low cholesterol diet, and weight control.  You are otherwise up to date with prevention measures today.  Please keep your appointments with your specialists as you may have planned  You will be contacted regarding the referral for: stress test  Please return in 6 months, or sooner if needed

## 2016-05-13 NOTE — Progress Notes (Signed)
Pre visit review using our clinic review tool, if applicable. No additional management support is needed unless otherwise documented below in the visit note. 

## 2016-05-13 NOTE — Assessment & Plan Note (Signed)
stable overall by history and exam, recent data reviewed with pt, and pt to continue medical treatment as before,  to f/u any worsening symptoms or concerns Lab Results  Component Value Date   TSH 2.05 04/21/2016

## 2016-05-13 NOTE — Assessment & Plan Note (Signed)
stable overall by history and exam, recent data reviewed with pt, and pt to continue medical treatment as before,  to f/u any worsening symptoms or concerns BP Readings from Last 3 Encounters:  05/13/16 130/76  04/01/16 (!) 151/94  11/12/15 132/76

## 2016-05-13 NOTE — Assessment & Plan Note (Addendum)
Atypical, etiology unclear, ? MSK vs cardiac or other, for pharmacologic stress test, pt is adamant does not want ECG today, cont same tx  Note:  Total time for pt hx, exam, review of record with pt in the room, determination of diagnoses and plan for further eval and tx is > 40 min, with over 50% spent in coordination and counseling of patient

## 2016-05-19 ENCOUNTER — Encounter: Payer: Self-pay | Admitting: Internal Medicine

## 2016-05-25 ENCOUNTER — Telehealth (HOSPITAL_COMMUNITY): Payer: Self-pay | Admitting: *Deleted

## 2016-05-25 NOTE — Telephone Encounter (Signed)
Patient given detailed instructions per Myocardial Perfusion Study Information Sheet for the test on 05/27/16 at 1000. Patient notified to arrive 15 minutes early and that it is imperative to arrive on time for appointment to keep from having the test rescheduled.  If you need to cancel or reschedule your appointment, please call the office within 24 hours of your appointment. Failure to do so may result in a cancellation of your appointment, and a $50 no show fee. Patient verbalized understanding.Veleta Yamamoto, [place]

## 2016-05-27 ENCOUNTER — Ambulatory Visit (HOSPITAL_COMMUNITY): Payer: Medicare Other | Attending: Cardiology

## 2016-05-27 DIAGNOSIS — R079 Chest pain, unspecified: Secondary | ICD-10-CM | POA: Diagnosis not present

## 2016-05-27 LAB — MYOCARDIAL PERFUSION IMAGING
CHL CUP NUCLEAR SSS: 7
CHL CUP RESTING HR STRESS: 60 {beats}/min
LHR: 0.38
Peak HR: 60 {beats}/min
SDS: 5
SRS: 2
TID: 1.12

## 2016-05-27 MED ORDER — TECHNETIUM TC 99M TETROFOSMIN IV KIT
31.7000 | PACK | Freq: Once | INTRAVENOUS | Status: AC | PRN
  Administered 2016-05-27: 31.7 via INTRAVENOUS
  Filled 2016-05-27: qty 32

## 2016-05-27 MED ORDER — TECHNETIUM TC 99M TETROFOSMIN IV KIT
10.2000 | PACK | Freq: Once | INTRAVENOUS | Status: AC | PRN
  Administered 2016-05-27: 10.2 via INTRAVENOUS
  Filled 2016-05-27: qty 11

## 2016-05-27 MED ORDER — REGADENOSON 0.4 MG/5ML IV SOLN
0.4000 mg | Freq: Once | INTRAVENOUS | Status: AC
  Administered 2016-05-27: 0.4 mg via INTRAVENOUS

## 2016-05-29 DIAGNOSIS — Z853 Personal history of malignant neoplasm of breast: Secondary | ICD-10-CM | POA: Diagnosis not present

## 2016-05-29 DIAGNOSIS — R140 Abdominal distension (gaseous): Secondary | ICD-10-CM | POA: Diagnosis not present

## 2016-09-02 ENCOUNTER — Ambulatory Visit (INDEPENDENT_AMBULATORY_CARE_PROVIDER_SITE_OTHER)
Admission: RE | Admit: 2016-09-02 | Discharge: 2016-09-02 | Disposition: A | Discharge status: Home / Self Care | Payer: Medicare Other | Source: Ambulatory Visit | Attending: Internal Medicine | Admitting: Internal Medicine

## 2016-09-02 ENCOUNTER — Encounter: Payer: Self-pay | Admitting: Internal Medicine

## 2016-09-02 ENCOUNTER — Ambulatory Visit (INDEPENDENT_AMBULATORY_CARE_PROVIDER_SITE_OTHER): Payer: Medicare Other | Admitting: Internal Medicine

## 2016-09-02 VITALS — BP 138/76 | HR 69 | Ht 61.0 in | Wt 105.0 lb

## 2016-09-02 DIAGNOSIS — J069 Acute upper respiratory infection, unspecified: Secondary | ICD-10-CM | POA: Insufficient documentation

## 2016-09-02 DIAGNOSIS — R05 Cough: Secondary | ICD-10-CM | POA: Diagnosis not present

## 2016-09-02 DIAGNOSIS — R079 Chest pain, unspecified: Secondary | ICD-10-CM

## 2016-09-02 DIAGNOSIS — B370 Candidal stomatitis: Secondary | ICD-10-CM | POA: Diagnosis not present

## 2016-09-02 DIAGNOSIS — J309 Allergic rhinitis, unspecified: Secondary | ICD-10-CM

## 2016-09-02 MED ORDER — NYSTATIN 100000 UNIT/ML MT SUSP: 500000.0000 [IU] | Freq: Four times a day (QID) | OROMUCOSAL | 1 refills | Status: AC

## 2016-09-02 MED ORDER — AZITHROMYCIN 250 MG PO TABS: ORAL_TABLET | ORAL | 1 refills | Status: DC

## 2016-09-02 MED ORDER — METHYLPREDNISOLONE ACETATE 80 MG/ML IJ SUSP
80.0000 mg | Freq: Once | INTRAMUSCULAR | Status: AC
  Administered 2016-09-02: 80 mg via INTRAMUSCULAR

## 2016-09-02 NOTE — Progress Notes (Signed)
Subjective:    Patient ID: Brandy Peterson, female    DOB: 06-15-1930, 83 y.o.   MRN: [id number]  HPI   Here with 2-3 days acute onset fever, facial pain, pressure, headache, general weakness and malaise, and greenish d/c, with mild ST and scant prod cough, with anterior chest diffuse dull pleuritic soreness, but pt denies wheezing, increased sob or doe, orthopnea, PND, increased LE swelling, palpitations, dizziness or syncope. Also with dry mouth with tongue redness and sore without fissures.  Does have several wks ongoing nasal allergy symptoms with clearish congestion, itch and sneezing, without fever, pain, ST, cough, swelling or wheezing, but has been out of flonase for some time, needs refill.   Past Medical History:  Diagnosis Date  . Backache, unspecified   . Carcinoma of breast ([place])    Right  . Complication of anesthesia    was told to never use Sodium Pentothal - mother had reaction. Also had trouble with intubation during mastectomy  . COPD (chronic obstructive pulmonary disease) ([place])   . Family history of adverse reaction to anesthesia   . GERD (gastroesophageal reflux disease)   . Hemochromatosis   . History of shingles   . Hypertension   . Hypothyroidism   . IBS (irritable bowel syndrome)   . Plantar fasciitis   . Pneumonia   . Raynaud's syndrome   . Spastic colon    hx of   . Thyroid disease   . Varicose veins    Past Surgical History:  Procedure Laterality Date  . ABDOMINAL HYSTERECTOMY  1979   PARTIAL  . APPENDECTOMY    . BONE SPUR  1970   BOTH FEET  . CATARACT EXTRACTION  10/2012 and 12/2012   bilateral with lens implants  . COLONOSCOPY    . KYPHOPLASTY N/A 10/19/2014   Procedure: Lumbar 1 kyphoplasty;  Surgeon: Jovita Gamma, MD;  Location: [facility];  Service: Neurosurgery;  Laterality: N/A;  . LUMBAR LAMINECTOMY  1991   L5-S1  . MASTECTOMY  1994   RIGHT  . MASTECTOMY  1994   Right    reports that she quit smoking about 28 years ago. Her smoking  use included Cigarettes. She has never used smokeless tobacco. She reports that she drinks about 1.8 oz of alcohol per week . She reports that she does not use drugs. family history includes Arthritis in her father; Bleeding Disorder in her mother; Cancer in her mother; Deep vein thrombosis in her father; Heart attack in her brother and father; Heart disease in her brother and father; Hemochromatosis in her brother and father; Hyperlipidemia in her son; Hypertension in her father and mother; Hypothyroidism in her mother; Osteoarthritis in her mother; Varicose Veins in her father and mother. Allergies  Allergen Reactions  . Contrast Media [Iodinated Diagnostic Agents] Hives, Itching and Other (See Comments)    Felt like she was going to faint   . Adhesive [Tape] Other (See Comments)    Prefers paper tape  . Sulfonamide Derivatives Other (See Comments)    Reaction: unknown    Current Outpatient Prescriptions on File Prior to Visit  Medication Sig Dispense Refill  . Biotin 5000 MCG CAPS Take 1 capsule (5,000 mcg total) by mouth daily. 30 capsule   . cholecalciferol (VITAMIN D) 1000 units tablet Take 1,000 Units by mouth daily.    . fluticasone (FLONASE) 50 MCG/ACT nasal spray Place 1 spray into both nostrils daily. 48 g 3  . levothyroxine (SYNTHROID, LEVOTHROID) 100 MCG tablet Take  1 tablet (100 mcg total) by mouth daily. 90 tablet 3  . omeprazole (PRILOSEC) 20 MG capsule Take 1 capsule (20 mg total) by mouth daily as needed (heartburn). 90 capsule 3  . triamterene-hydrochlorothiazide (MAXZIDE-25) 37.5-25 MG tablet Take 1 tablet by mouth daily. 90 tablet 3   No current facility-administered medications on file prior to visit.    Review of Systems  Constitutional: Negative for other unusual diaphoresis or sweats HENT: Negative for ear discharge or swelling Eyes: Negative for other worsening visual disturbances Respiratory: Negative for stridor or other swelling  Gastrointestinal: Negative for  worsening distension or other blood Genitourinary: Negative for retention or other urinary change Musculoskeletal: Negative for other MSK pain or swelling Skin: Negative for color change or other new lesions Neurological: Negative for worsening tremors and other numbness  Psychiatric/Behavioral: Negative for worsening agitation or other fatigue All other system neg per pt    Objective:   Physical Exam BP 138/76   Pulse 69   Ht 5\' 1"  (1.549 m)   Wt 105 lb (47.6 kg)   SpO2 100%   BMI 19.84 kg/m  VS noted, mild ill, thin, frail Constitutional: Pt appears in NAD HENT: Head: NCAT.  Right Ear: External ear normal.  Left Ear: External ear normal.  Eyes: . Pupils are equal, round, and reactive to light. Conjunctivae and EOM are normal Nose: without d/c or deformity Bilat tm's with mild erythema.  Max sinus areas mild tender.  Pharynx with mild erythema, no exudate Tongue:  Dorsal whitish thrush like coating noted, mild tender without tongue swelling Neck: Neck supple. Gross normal ROM Cardiovascular: Normal rate and regular rhythm.   Pulmonary/Chest: Effort normal and breath sounds without rales or wheezing.  Abd:  Soft, NT, ND, + BS, no organomegaly Neurological: Pt is alert. At baseline orientation, motor grossly intact Skin: Skin is warm. No rashes, other new lesions, no LE edema Psychiatric: Pt behavior is normal without agitation  No other exam findings    Assessment & Plan:

## 2016-09-02 NOTE — Patient Instructions (Signed)
You had the steroid shot today  Please take all new medication as prescribed  - the antibiotic, and the treatment for thrush  Please continue all other medications as before, and refills have been done if requested.  Please have the pharmacy call with any other refills you may need.  Please keep your appointments with your specialists as you may have planned  Please go to the XRAY Department in the Basement (go straight as you get off the elevator) for the x-ray testing  You will be contacted by phone if any changes need to be made immediately.  Otherwise, you will receive a letter about your results with an explanation, but please check with MyChart first.  Please remember to sign up for MyChart if you have not done so, as this will be important to you in the future with finding out test results, communicating by private email, and scheduling acute appointments online when needed.

## 2016-09-03 NOTE — Assessment & Plan Note (Signed)
Mild to mod, for flonase restart,  to f/u any worsening symptoms or concerns 

## 2016-09-03 NOTE — Assessment & Plan Note (Signed)
Mild to mod, for topical nystatin course,  to f/u any worsening symptoms or concerns

## 2016-09-03 NOTE — Assessment & Plan Note (Signed)
Low cardiac suspicion, likley msk, for cxr r/o pna as well

## 2016-09-03 NOTE — Assessment & Plan Note (Signed)
Mild to mod, for antibx course,  to f/u any worsening symptoms or concerns 

## 2016-09-04 ENCOUNTER — Encounter: Payer: Self-pay | Admitting: Internal Medicine

## 2016-09-25 ENCOUNTER — Ambulatory Visit (HOSPITAL_BASED_OUTPATIENT_CLINIC_OR_DEPARTMENT_OTHER): Payer: Medicare Other | Admitting: Hematology

## 2016-09-25 ENCOUNTER — Encounter: Payer: Self-pay | Admitting: Hematology

## 2016-09-25 ENCOUNTER — Other Ambulatory Visit (HOSPITAL_BASED_OUTPATIENT_CLINIC_OR_DEPARTMENT_OTHER): Payer: Medicare Other

## 2016-09-25 ENCOUNTER — Other Ambulatory Visit: Payer: Self-pay | Admitting: Hematology

## 2016-09-25 DIAGNOSIS — Z853 Personal history of malignant neoplasm of breast: Secondary | ICD-10-CM | POA: Diagnosis not present

## 2016-09-25 DIAGNOSIS — C50911 Malignant neoplasm of unspecified site of right female breast: Secondary | ICD-10-CM

## 2016-09-25 LAB — IRON AND TIBC
%SAT: 68 % — AB (ref 21–57)
Iron: 176 ug/dL — ABNORMAL HIGH (ref 41–142)
TIBC: 260 ug/dL (ref 236–444)
UIBC: 84 ug/dL — AB (ref 120–384)

## 2016-09-25 LAB — CBC & DIFF AND RETIC
BASO%: 0.3 % (ref 0.0–2.0)
BASOS ABS: 0 10*3/uL (ref 0.0–0.1)
EOS%: 3.9 % (ref 0.0–7.0)
Eosinophils Absolute: 0.3 10*3/uL (ref 0.0–0.5)
HEMATOCRIT: 43.6 % (ref 34.8–46.6)
HEMOGLOBIN: 15.2 g/dL (ref 11.6–15.9)
Immature Retic Fract: 1.1 % — ABNORMAL LOW (ref 1.60–10.00)
LYMPH#: 1.5 10*3/uL (ref 0.9–3.3)
LYMPH%: 22.6 % (ref 14.0–49.7)
MCH: 32.7 pg (ref 25.1–34.0)
MCHC: 34.9 g/dL (ref 31.5–36.0)
MCV: 93.8 fL (ref 79.5–101.0)
MONO#: 0.5 10*3/uL (ref 0.1–0.9)
MONO%: 7.6 % (ref 0.0–14.0)
NEUT#: 4.2 10*3/uL (ref 1.5–6.5)
NEUT%: 65.6 % (ref 38.4–76.8)
Platelets: 230 10*3/uL (ref 145–400)
RBC: 4.65 10*6/uL (ref 3.70–5.45)
RDW: 12.7 % (ref 11.2–14.5)
RETIC %: 1.47 % (ref 0.70–2.10)
Retic Ct Abs: 68.36 10*3/uL (ref 33.70–90.70)
WBC: 6.4 10*3/uL (ref 3.9–10.3)
nRBC: 0 % (ref 0–0)

## 2016-09-25 LAB — FERRITIN: FERRITIN: 64 ng/mL (ref 9–269)

## 2016-09-25 LAB — COMPREHENSIVE METABOLIC PANEL
ALBUMIN: 3.8 g/dL (ref 3.5–5.0)
ALK PHOS: 81 U/L (ref 40–150)
ALT: 17 U/L (ref 0–55)
AST: 24 U/L (ref 5–34)
Anion Gap: 8 mEq/L (ref 3–11)
BUN: 15.7 mg/dL (ref 7.0–26.0)
CALCIUM: 9.3 mg/dL (ref 8.4–10.4)
CO2: 27 mEq/L (ref 22–29)
CREATININE: 0.8 mg/dL (ref 0.6–1.1)
Chloride: 101 mEq/L (ref 98–109)
EGFR: 65 mL/min/{1.73_m2} — ABNORMAL LOW (ref >90)
Glucose: 93 mg/dl (ref 70–140)
POTASSIUM: 4.4 meq/L (ref 3.5–5.1)
Sodium: 137 mEq/L (ref 136–145)
Total Bilirubin: 0.65 mg/dL (ref 0.20–1.20)
Total Protein: 6.9 g/dL (ref 6.4–8.3)

## 2016-09-25 NOTE — Patient Instructions (Signed)
Thank you for choosing [facility] to provide your oncology and hematology care.  To afford each patient quality time with our providers, please arrive 30 minutes before your scheduled appointment time.  If you arrive late for your appointment, you may be asked to reschedule.  We strive to give you quality time with our providers, and arriving late affects you and other patients whose appointments are after yours.  If you are a no show for multiple scheduled visits, you may be dismissed from the clinic at the providers discretion.   Again, thank you for choosing [facility], our hope is that these requests will decrease the amount of time that you wait before being seen by our physicians.  ______________________________________________________________________ Should you have questions after your visit to the [facility], please contact our office at [phone] between the hours of 8:30 and 4:30 p.m.    Voicemails left after 4:30p.m will not be returned until the following business day.   For prescription refill requests, please have your pharmacy contact us directly.  Please also try to allow 48 hours for prescription requests.   Please contact the scheduling department for questions regarding scheduling.  For scheduling of procedures such as PET scans, CT scans, MRI, Ultrasound, etc please contact central scheduling at [phone].   Resources For Cancer Patients and Caregivers:  American Cancer Society:  800-227-2345  Can help patients locate various types of support and financial assistance Cancer Care: 1-800-813-HOPE (4673) Provides financial assistance, online support groups, medication/co-pay assistance.   [place] DSS:  336-641-3447 Where to apply for food stamps, Medicaid, and utility assistance Medicare Rights Center: 800-333-4114 Helps people with Medicare understand their rights and benefits, navigate the Medicare system, and secure the  quality healthcare they deserve SCAT: 336-333-6589 [place] [employer]'s shared-ride transportation service for eligible riders who have a disability that prevents them from riding the fixed route bus.   For additional information on assistance programs please contact our social worker:   Grier Hock/Abigail Elmore:  [phone] 

## 2016-09-28 ENCOUNTER — Telehealth: Payer: Self-pay | Admitting: Hematology

## 2016-09-28 ENCOUNTER — Encounter: Payer: Self-pay | Admitting: Hematology

## 2016-09-28 NOTE — Progress Notes (Signed)
Marland Kitchen    HEMATOLOGY/ONCOLOGY CONSULTATION NOTE  Date of Service: .09/25/2016  Patient Care Team: Biagio Borg, MD as PCP - General (Internal Medicine) Gordy Levan, MD (Hematology and Oncology) Druscilla Brownie, MD (Dermatology) Laurence Spates, MD (Gastroenterology)  CHIEF COMPLAINTS/PURPOSE OF CONSULTATION:  F/u for Hemochromatosis  HISTORY OF PRESENTING ILLNESS:   Brandy Peterson is a wonderful 83 y.o. female who has been referred to [place] by Dr Marko Plume for ongoing management of hemochromatosis. She notes that she has a family history of hemochromatosis and was diagnosed in the 85s. She has required maintenance therapy to phlebotomies roughly every 6 months to try to keep her ferritin levels below 50. She notes no known history of liver disease or heart disease. She reports that her ferritin levels even on diagnosis but not more than 1000. She does not recollect what her genetic mutation studies showed. Patient today has no acute new concerns. No new skin pigmentation changes. No new fatigue. No abdominal pain or distention. No shortness of breath or DOE.  MEDICAL HISTORY:  Past Medical History:  Diagnosis Date  . Backache, unspecified   . Carcinoma of breast ([place])    Right  . Complication of anesthesia    was told to never use Sodium Pentothal - mother had reaction. Also had trouble with intubation during mastectomy  . COPD (chronic obstructive pulmonary disease) ([place])   . Family history of adverse reaction to anesthesia   . GERD (gastroesophageal reflux disease)   . Hemochromatosis   . History of shingles   . Hypertension   . Hypothyroidism   . IBS (irritable bowel syndrome)   . Plantar fasciitis   . Pneumonia   . Raynaud's syndrome   . Spastic colon    hx of   . Thyroid disease   . Varicose veins     SURGICAL HISTORY: Past Surgical History:  Procedure Laterality Date  . ABDOMINAL HYSTERECTOMY  1979   PARTIAL  . APPENDECTOMY    . BONE SPUR  1970   BOTH  FEET  . CATARACT EXTRACTION  10/2012 and 12/2012   bilateral with lens implants  . COLONOSCOPY    . KYPHOPLASTY N/A 10/19/2014   Procedure: Lumbar 1 kyphoplasty;  Surgeon: Jovita Gamma, MD;  Location: [facility];  Service: Neurosurgery;  Laterality: N/A;  . LUMBAR LAMINECTOMY  1991   L5-S1  . MASTECTOMY  1994   RIGHT  . MASTECTOMY  1994   Right    SOCIAL HISTORY: Social History   Social History  . Marital status: Widowed    Spouse name: N/A  . Number of children: N/A  . Years of education: N/A   Occupational History  . Not on file.   Social History Main Topics  . Smoking status: Former Smoker    Types: Cigarettes    Quit date: 04/13/1988  . Smokeless tobacco: Never Used     Comment: approximate quit date  . Alcohol use 1.8 oz/week    3 Glasses of wine per week     Comment: 2 OZ, 5 TIMES A WEEK  . Drug use: No  . Sexual activity: No   Other Topics Concern  . 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 She now lives alone I-ADLs. Pt has 1 son born 63 and 2 grandchildren.       Pt had a colonoscopy on 05/09/2012 by Dr Oletta Lamas.  Pt reports having a pneumo vaccine.    FAMILY HISTORY: Family History  Problem Relation Age of Onset  . Cancer Mother        metastatic cancer (pancreas vs lung)  . Hypertension Mother   . Osteoarthritis Mother   . Hypothyroidism Mother   . Varicose Veins Mother   . Bleeding Disorder Mother   . Heart disease Father        CAD/MI  . Hypertension Father   . Arthritis Father   . Hemochromatosis Father        Possible  . Deep vein thrombosis Father   . Varicose Veins Father   . Heart attack Father   . Heart attack Brother   . Hemochromatosis Brother   . Heart disease Brother   . Hyperlipidemia Son   . Diabetes Neg Hx     ALLERGIES:  is allergic to contrast media [iodinated diagnostic agents]; adhesive [tape]; and  sulfonamide derivatives.  MEDICATIONS:  Current Outpatient Prescriptions  Medication Sig Dispense Refill  . Biotin 5000 MCG CAPS Take 1 capsule (5,000 mcg total) by mouth daily. 30 capsule   . cholecalciferol (VITAMIN D) 1000 units tablet Take 1,000 Units by mouth daily.    . fluticasone (FLONASE) 50 MCG/ACT nasal spray Place 1 spray into both nostrils daily. 48 g 3  . levothyroxine (SYNTHROID, LEVOTHROID) 100 MCG tablet Take 1 tablet (100 mcg total) by mouth daily. 90 tablet 3  . omeprazole (PRILOSEC) 20 MG capsule Take 1 capsule (20 mg total) by mouth daily as needed (heartburn). 90 capsule 3  . triamterene-hydrochlorothiazide (MAXZIDE-25) 37.5-25 MG tablet Take 1 tablet by mouth daily. 90 tablet 3   No current facility-administered medications for this visit.     REVIEW OF SYSTEMS:    10 Point review of Systems was done is negative except as noted above.  PHYSICAL EXAMINATION: ECOG PERFORMANCE STATUS: 1 - Symptomatic but completely ambulatory  . Vitals:   09/25/16 1042  BP: (!) 161/64  Pulse: 60  Resp: 18  Temp: 97.6 F (36.4 C)   Filed Weights   09/25/16 1042  Weight: 105 lb 3.2 oz (47.7 kg)   .Body mass index is 19.88 kg/m.  GENERAL:alert, in no acute distress and comfortable SKIN: no acute rashes, no significant lesions EYES: conjunctiva are pink and non-injected, sclera anicteric OROPHARYNX: MMM, no exudates, no oropharyngeal erythema or ulceration NECK: supple, no JVD LYMPH:  no palpable lymphadenopathy in the cervical, axillary or inguinal regions LUNGS: clear to auscultation b/l with normal respiratory effort HEART: regular rate & rhythm ABDOMEN:  normoactive bowel sounds , non tender, not distended. Extremity: no pedal edema PSYCH: alert & oriented x 3 with fluent speech NEURO: no focal motor/sensory deficits  LABORATORY DATA:  I have reviewed the data as listed  . CBC Latest Ref Rng & Units 09/25/2016 11/15/2015 08/16/2015  WBC 3.9 - 10.3 10e3/uL 6.4 7.2  8.3  Hemoglobin 11.6 - 15.9 g/dL 15.2 15.1 13.9  Hematocrit 34.8 - 46.6 % 43.6 44.3 41.2  Platelets 145 - 400 10e3/uL 230 250 253    . CMP Latest Ref Rng & Units 09/25/2016 11/15/2015 04/12/2015  Glucose 70 - 140 mg/dl 93 91 80  BUN 7.0 - 26.0 mg/dL 15.7 17.2 23  Creatinine 0.6 - 1.1 mg/dL 0.8 0.8 0.81  Sodium 136 - 145 mEq/L 137 138 136  Potassium 3.5 - 5.1 mEq/L 4.4 4.3 4.4  Chloride 96 - 112 mEq/L - - 99  CO2 22 - 29 mEq/L 27 25 31  Calcium 8.4 - 10.4 mg/dL 9.3 9.3 9.7  Total Protein 6.4 - 8.3 g/dL 6.9 6.8 7.3  Total Bilirubin 0.20 - 1.20 mg/dL 0.65 0.56 0.5  Alkaline Phos 40 - 150 U/L 81 82 85  AST 5 - 34 U/L 24 18 20   ALT 0 - 55 U/L 17 12 14    . Lab Results  Component Value Date   IRON 176 (H) 09/25/2016   TIBC 260 09/25/2016   IRONPCTSAT 68 (H) 09/25/2016   (Iron and TIBC)  Lab Results  Component Value Date   FERRITIN 64 09/25/2016     RADIOGRAPHIC STUDIES: I have personally reviewed the radiological images as listed and agreed with the findings in the report. Dg Chest 2 View  Result Date: 09/03/2016 CLINICAL DATA:  83 year old female with history of cough and anterior chest pain for the past 2 days. EXAM: CHEST  2 VIEW COMPARISON:  Chest x-ray 11/12/2015. FINDINGS: Lung volumes are normal. No consolidative airspace disease. No pleural effusions. No pneumothorax. No pulmonary nodule or mass noted. Pulmonary vasculature and the cardiomediastinal silhouette are within normal limits. Atherosclerosis in the thoracic aorta. Status post right mastectomy. Vertebroplasty changes in the lower thoracic vertebral body. IMPRESSION: 1. No radiographic evidence of acute cardiopulmonary disease. 2. Aortic atherosclerosis. Electronically Signed   By: Vinnie Langton M.D.   On: 09/03/2016 08:21    ASSESSMENT & PLAN:   83 year old female with  #1 history of hemochromatosis .  Unknown genetic mutations. Patient notes that she has never had a ferritin level mole in 1000. Family  history positive. Her goal has been to keep her ferritin levels and 50. She has roughly needed a therapeutic phlebotomy every 6 months or so. Ferritin levels today are 64 with an iron saturation of 68%. Plan - The patient was counseled to pursue good oral hydration . -She will be scheduled for a therapeutic phlebotomy within this week . -We shall see her back in 6 months with plan to get labs 1 week prior to clinic visit . -Scheduled for therapeutic phlebotomy now and in 6 months on same day as clinic #2  visit. -We shall get a HFE gene mutation studies upon her follow-up  #2 T1N0 node negative right breast cancer 1994, post mastectomy with nodes, 5 years of adjuvant tamoxifen. No clinical evidence of breast cancer recurrence at this time . Plan  - continue yearly mammograms with primary care physician  -She has required 3D tomo mammography due to extremely dense breast tissue.  Labs in 6 months (1 week prior to clinic visit) RTC with Dr Irene Limbo in 6 months with appointment for therapeutic phlebotomy post clinic visit. Labs 1 week prior to clinic visit.   All of the patients questions were answered with apparent satisfaction. The patient knows to call the clinic with any problems, questions or concerns.  I spent 25 minutes counseling the patient face to face. The total time spent in the appointment was 40 minutes and more than 50% was on counseling and direct patient cares.    Sullivan Lone MD [place] AAHIVMS [facility] Hematology/Oncology Physician [facility]  (Office):       [phone] (Work cell):  [phone] (Fax):           [phone]

## 2016-09-28 NOTE — Telephone Encounter (Signed)
Scheduled appt per 6/15 los - Patient aware of appts - reminder letter sent.

## 2016-09-29 ENCOUNTER — Telehealth: Payer: Self-pay | Admitting: Hematology

## 2016-09-29 NOTE — Telephone Encounter (Signed)
Scheduled appt per 6/18 sch message - patient aware of appt date and time.

## 2016-10-02 ENCOUNTER — Ambulatory Visit (HOSPITAL_BASED_OUTPATIENT_CLINIC_OR_DEPARTMENT_OTHER): Payer: Medicare Other

## 2016-10-02 NOTE — Patient Instructions (Signed)
Therapeutic Phlebotomy, Care After  Refer to this sheet in the next few weeks. These instructions provide you with information about caring for yourself after your procedure. Your health care provider may also give you more specific instructions. Your treatment has been planned according to current medical practices, but problems sometimes occur. Call your health care provider if you have any problems or questions after your procedure.  WHAT TO EXPECT AFTER THE PROCEDURE  After your procedure, it is common to have:   Light-headedness or dizziness. You may feel faint.   Nausea.   Tiredness.  HOME CARE INSTRUCTIONS  Activities   Return to your normal activities as directed by your health care provider. Most people can go back to their normal activities right away.   Avoid strenuous physical activity and heavy lifting or pulling for about 5 hours after the procedure. Do not lift anything that is heavier than 10 lb (4.5 kg).   Athletes should avoid strenuous exercise for at least 12 hours.   Change positions slowly for the remainder of the day. This will help to prevent light-headedness or fainting.   If you feel light-headed, lie down until the feeling goes away.  Eating and Drinking   Be sure to eat well-balanced meals for the next 24 hours.   Drink enough fluid to keep your urine clear or pale yellow.   Avoid drinking alcohol on the day that you had the procedure.  Care of the Needle Insertion Site   Keep your bandage dry. You can remove the bandage after about 5 hours or as directed by your health care provider.   If you have bleeding from the needle insertion site, elevate your arm and press firmly on the site until the bleeding stops.   If you have bruising at the site, apply ice to the area:   Put ice in a plastic bag.   Place a towel between your skin and the bag.   Leave the ice on for 20 minutes, 2-3 times a day for the first 24 hours.   If the swelling does not go away after 24 hours, apply  a warm, moist washcloth to the area for 20 minutes, 2-3 times a day.  General Instructions   Avoid smoking for at least 30 minutes after the procedure.   Keep all follow-up visits as directed by your health care provider. It is important to continue with further therapeutic phlebotomy treatments as directed.  SEEK MEDICAL CARE IF:   You have redness, swelling, or pain at the needle insertion site.   You have fluid, blood, or pus coming from the needle insertion site.   You feel light-headed, dizzy, or nauseated, and the feeling does not go away.   You notice new bruising at the needle insertion site.   You feel weaker than normal.   You have a fever or chills.  SEEK IMMEDIATE MEDICAL CARE IF:   You have severe nausea or vomiting.   You have chest pain.   You have trouble breathing.    This information is not intended to replace advice given to you by your health care provider. Make sure you discuss any questions you have with your health care provider.    Document Released: 09/01/2010 Document Revised: 08/14/2014 Document Reviewed: 03/26/2014  Elsevier Interactive Patient Education 2016 [employer]

## 2016-10-06 ENCOUNTER — Telehealth: Payer: Self-pay

## 2016-10-06 NOTE — Telephone Encounter (Signed)
Left message with pt to bring gene mutation paperwork at next appt so that we can scan into our system.

## 2016-12-22 DIAGNOSIS — K1230 Oral mucositis (ulcerative), unspecified: Secondary | ICD-10-CM | POA: Diagnosis not present

## 2016-12-23 DIAGNOSIS — Z853 Personal history of malignant neoplasm of breast: Secondary | ICD-10-CM | POA: Diagnosis not present

## 2016-12-23 DIAGNOSIS — Z1231 Encounter for screening mammogram for malignant neoplasm of breast: Secondary | ICD-10-CM | POA: Diagnosis not present

## 2016-12-23 LAB — HM MAMMOGRAPHY

## 2017-01-21 ENCOUNTER — Ambulatory Visit (INDEPENDENT_AMBULATORY_CARE_PROVIDER_SITE_OTHER): Payer: Medicare Other | Admitting: General Practice

## 2017-01-21 DIAGNOSIS — Z23 Encounter for immunization: Secondary | ICD-10-CM

## 2017-03-09 ENCOUNTER — Telehealth: Payer: Self-pay

## 2017-03-09 NOTE — Telephone Encounter (Signed)
Pt is calling to have lab changed to about 3 in the afternoon on 12/11. She has an event at church that she needs to put money down on that is waiting for the schedule change. inbasket sent.

## 2017-03-12 ENCOUNTER — Telehealth: Payer: Self-pay | Admitting: Hematology

## 2017-03-12 NOTE — Telephone Encounter (Signed)
Left message for patient regarding upcoming appointment updates per 11/27 sch message.

## 2017-03-23 ENCOUNTER — Other Ambulatory Visit (HOSPITAL_BASED_OUTPATIENT_CLINIC_OR_DEPARTMENT_OTHER): Payer: Medicare Other

## 2017-03-23 LAB — CBC & DIFF AND RETIC
BASO%: 0.2 % (ref 0.0–2.0)
BASOS ABS: 0 10*3/uL (ref 0.0–0.1)
EOS%: 4.3 % (ref 0.0–7.0)
Eosinophils Absolute: 0.3 10*3/uL (ref 0.0–0.5)
HCT: 41.4 % (ref 34.8–46.6)
HGB: 14.4 g/dL (ref 11.6–15.9)
Immature Retic Fract: 1.6 % (ref 1.60–10.00)
LYMPH#: 1.1 10*3/uL (ref 0.9–3.3)
LYMPH%: 17.7 % (ref 14.0–49.7)
MCH: 32.6 pg (ref 25.1–34.0)
MCHC: 34.8 g/dL (ref 31.5–36.0)
MCV: 93.7 fL (ref 79.5–101.0)
MONO#: 0.6 10*3/uL (ref 0.1–0.9)
MONO%: 10.3 % (ref 0.0–14.0)
NEUT%: 67.5 % (ref 38.4–76.8)
NEUTROS ABS: 4.1 10*3/uL (ref 1.5–6.5)
Platelets: 232 10*3/uL (ref 145–400)
RBC: 4.42 10*6/uL (ref 3.70–5.45)
RDW: 12.6 % (ref 11.2–14.5)
RETIC %: 1.73 % (ref 0.70–2.10)
RETIC CT ABS: 76.47 10*3/uL (ref 33.70–90.70)
WBC: 6.1 10*3/uL (ref 3.9–10.3)

## 2017-03-23 LAB — COMPREHENSIVE METABOLIC PANEL
ALK PHOS: 86 U/L (ref 40–150)
ALT: 17 U/L (ref 0–55)
ANION GAP: 10 meq/L (ref 3–11)
AST: 23 U/L (ref 5–34)
Albumin: 3.8 g/dL (ref 3.5–5.0)
BILIRUBIN TOTAL: 0.67 mg/dL (ref 0.20–1.20)
BUN: 11.2 mg/dL (ref 7.0–26.0)
CALCIUM: 9.1 mg/dL (ref 8.4–10.4)
CO2: 27 meq/L (ref 22–29)
Chloride: 102 mEq/L (ref 98–109)
Creatinine: 0.8 mg/dL (ref 0.6–1.1)
Glucose: 83 mg/dl (ref 70–140)
Potassium: 3.4 mEq/L — ABNORMAL LOW (ref 3.5–5.1)
Sodium: 139 mEq/L (ref 136–145)
TOTAL PROTEIN: 6.8 g/dL (ref 6.4–8.3)

## 2017-03-23 LAB — FERRITIN: FERRITIN: 65 ng/mL (ref 9–269)

## 2017-03-23 LAB — IRON AND TIBC
%SAT: 58 % — ABNORMAL HIGH (ref 21–57)
IRON: 149 ug/dL — AB (ref 41–142)
TIBC: 255 ug/dL (ref 236–444)
UIBC: 106 ug/dL — ABNORMAL LOW (ref 120–384)

## 2017-03-29 ENCOUNTER — Encounter (INDEPENDENT_AMBULATORY_CARE_PROVIDER_SITE_OTHER): Payer: Self-pay

## 2017-03-29 LAB — HEMOCHROMATOSIS DNA-PCR(C282Y,H63D)

## 2017-03-30 ENCOUNTER — Telehealth: Payer: Self-pay | Admitting: Hematology

## 2017-03-30 ENCOUNTER — Ambulatory Visit (HOSPITAL_BASED_OUTPATIENT_CLINIC_OR_DEPARTMENT_OTHER): Payer: Medicare Other | Admitting: Hematology

## 2017-03-30 ENCOUNTER — Encounter: Payer: Self-pay | Admitting: Hematology

## 2017-03-30 DIAGNOSIS — Z853 Personal history of malignant neoplasm of breast: Secondary | ICD-10-CM

## 2017-03-30 NOTE — Telephone Encounter (Signed)
Gave avs and calendar for June and December 2019 waiting for 12/26,27 or 28

## 2017-03-30 NOTE — Progress Notes (Signed)
Marland Kitchen    HEMATOLOGY/ONCOLOGY CONSULTATION NOTE  Date of Service: .09/25/2016  Patient Care Team: Biagio Borg, MD as PCP - General (Internal Medicine) Gordy Levan, MD (Hematology and Oncology) Druscilla Brownie, MD (Dermatology) Laurence Spates, MD (Gastroenterology)  CHIEF COMPLAINTS/PURPOSE OF CONSULTATION:  F/u for Hemochromatosis  HISTORY OF PRESENTING ILLNESS:   Brandy Peterson is a wonderful 83 y.o. female who has been referred to [place] by Dr Marko Plume for ongoing management of hemochromatosis. She notes that she has a family history of hemochromatosis and was diagnosed in the 23s. She has required maintenance therapy to phlebotomies roughly every 6 months to try to keep her ferritin levels below 50. She notes no known history of liver disease or heart disease. She reports that her ferritin levels even on diagnosis but not more than 1000. She does not recollect what her genetic mutation studies showed. Patient today has no acute new concerns. No new skin pigmentation changes. No new fatigue. No abdominal pain or distention. No shortness of breath or DOE.  INTERVAL HISTORY:  Ms. Pierre is here for fu of her hemochromatosis. Her iron level on 03/23/2017 was 149, ferritin is stable at 65. She states she is doing well overall and states she would prefer to continue phlebotomies to keep her ferritin below 50.  On review of systems, she reports abdominal pain and denies fever, chills, night sweats and any other accompanying symptoms.   MEDICAL HISTORY:  Past Medical History:  Diagnosis Date  . Backache, unspecified   . Carcinoma of breast ([place])    Right  . Complication of anesthesia    was told to never use Sodium Pentothal - mother had reaction. Also had trouble with intubation during mastectomy  . COPD (chronic obstructive pulmonary disease) ([place])   . Family history of adverse reaction to anesthesia   . GERD (gastroesophageal reflux disease)   . Hemochromatosis   . History of  shingles   . Hypertension   . Hypothyroidism   . IBS (irritable bowel syndrome)   . Plantar fasciitis   . Pneumonia   . Raynaud's syndrome   . Spastic colon    hx of   . Thyroid disease   . Varicose veins     SURGICAL HISTORY: Past Surgical History:  Procedure Laterality Date  . ABDOMINAL HYSTERECTOMY  1979   PARTIAL  . APPENDECTOMY    . BONE SPUR  1970   BOTH FEET  . CATARACT EXTRACTION  10/2012 and 12/2012   bilateral with lens implants  . COLONOSCOPY    . KYPHOPLASTY N/A 10/19/2014   Procedure: Lumbar 1 kyphoplasty;  Surgeon: Jovita Gamma, MD;  Location: [place] [facility];  Service: Neurosurgery;  Laterality: N/A;  . LUMBAR LAMINECTOMY  1991   L5-S1  . MASTECTOMY  1994   RIGHT  . MASTECTOMY  1994   Right    SOCIAL HISTORY: Social History   Socioeconomic History  . Marital status: Widowed    Spouse name: Not on file  . Number of children: Not on file  . Years of education: Not on file  . Highest education level: Not on file  Social Needs  . Financial resource strain: Not on file  . Food insecurity - worry: Not on file  . Food insecurity - inability: Not on file  . Transportation needs - medical: Not on file  . Transportation needs - non-medical: Not on file  Occupational History  . Not on file  Tobacco Use  . Smoking status: Former Smoker  Types: Cigarettes    Last attempt to quit: 04/13/1988    Years since quitting: 28.9  . Smokeless tobacco: Never Used  . Tobacco comment: approximate quit date  Substance and Sexual Activity  . Alcohol use: Yes    Alcohol/week: 1.8 oz    Types: 3 Glasses of wine per week    Comment: 2 OZ, 5 TIMES A WEEK  . Drug use: No  . Sexual activity: No  Other Topics Concern  . 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 She now lives alone I-ADLs. Pt has 1 son born 94 and 2 grandchildren.       Pt had a colonoscopy on 05/09/2012  by Dr Oletta Lamas.       Pt reports having a pneumo vaccine.    FAMILY HISTORY: Family History  Problem Relation Age of Onset  . Cancer Mother        metastatic cancer (pancreas vs lung)  . Hypertension Mother   . Osteoarthritis Mother   . Hypothyroidism Mother   . Varicose Veins Mother   . Bleeding Disorder Mother   . Heart disease Father        CAD/MI  . Hypertension Father   . Arthritis Father   . Hemochromatosis Father        Possible  . Deep vein thrombosis Father   . Varicose Veins Father   . Heart attack Father   . Heart attack Brother   . Hemochromatosis Brother   . Heart disease Brother   . Hyperlipidemia Son   . Diabetes Neg Hx     ALLERGIES:  is allergic to contrast media [iodinated diagnostic agents]; adhesive [tape]; and sulfonamide derivatives.  MEDICATIONS:  Current Outpatient Medications  Medication Sig Dispense Refill  . Biotin 5000 MCG CAPS Take 1 capsule (5,000 mcg total) by mouth daily. 30 capsule   . fluticasone (FLONASE) 50 MCG/ACT nasal spray Place 1 spray into both nostrils daily. 48 g 3  . levothyroxine (SYNTHROID, LEVOTHROID) 100 MCG tablet Take 1 tablet (100 mcg total) by mouth daily. 90 tablet 3  . omeprazole (PRILOSEC) 20 MG capsule Take 1 capsule (20 mg total) by mouth daily as needed (heartburn). 90 capsule 3  . triamterene-hydrochlorothiazide (MAXZIDE-25) 37.5-25 MG tablet Take 1 tablet by mouth daily. 90 tablet 3  . cholecalciferol (VITAMIN D) 1000 units tablet Take 1,000 Units by mouth daily.     No current facility-administered medications for this visit.     REVIEW OF SYSTEMS:    10 Point review of Systems was done is negative except as noted above.  PHYSICAL EXAMINATION: ECOG PERFORMANCE STATUS: 1 - Symptomatic but completely ambulatory  . Vitals:   03/30/17 1050  BP: (!) 156/71  Pulse: 73  Resp: 16  Temp: (!) 97.5 F (36.4 C)  SpO2: 100%   Filed Weights   03/30/17 1050  Weight: 108 lb 8 oz (49.2 kg)   .Body mass  index is 20.5 kg/m.  GENERAL:alert, in no acute distress and comfortable SKIN: no acute rashes, no significant lesions EYES: conjunctiva are pink and non-injected, sclera anicteric OROPHARYNX: MMM, no exudates, no oropharyngeal erythema or ulceration NECK: supple, no JVD LYMPH:  no palpable lymphadenopathy in the cervical, axillary or inguinal regions LUNGS: clear to auscultation b/l with normal respiratory effort HEART: regular rate & rhythm ABDOMEN:  normoactive bowel sounds , non tender, not distended. Extremity: no pedal edema PSYCH: alert &  oriented x 3 with fluent speech NEURO: no focal motor/sensory deficits  LABORATORY DATA:  I have reviewed the data as listed  . CBC Latest Ref Rng & Units 03/23/2017 09/25/2016 11/15/2015  WBC 3.9 - 10.3 10e3/uL 6.1 6.4 7.2  Hemoglobin 11.6 - 15.9 g/dL 14.4 15.2 15.1  Hematocrit 34.8 - 46.6 % 41.4 43.6 44.3  Platelets 145 - 400 10e3/uL 232 230 250    . CMP Latest Ref Rng & Units 03/23/2017 09/25/2016 11/15/2015  Glucose 70 - 140 mg/dl 83 93 91  BUN 7.0 - 26.0 mg/dL 11.2 15.7 17.2  Creatinine 0.6 - 1.1 mg/dL 0.8 0.8 0.8  Sodium 136 - 145 mEq/L 139 137 138  Potassium 3.5 - 5.1 mEq/L 3.4(L) 4.4 4.3  Chloride 96 - 112 mEq/L - - -  CO2 22 - 29 mEq/L 27 27 25   Calcium 8.4 - 10.4 mg/dL 9.1 9.3 9.3  Total Protein 6.4 - 8.3 g/dL 6.8 6.9 6.8  Total Bilirubin 0.20 - 1.20 mg/dL 0.67 0.65 0.56  Alkaline Phos 40 - 150 U/L 86 81 82  AST 5 - 34 U/L 23 24 18   ALT 0 - 55 U/L 17 17 12    . Lab Results  Component Value Date   IRON 149 (H) 03/23/2017   TIBC 255 03/23/2017   IRONPCTSAT 58 (H) 03/23/2017   (Iron and TIBC)  Lab Results  Component Value Date   FERRITIN 65 03/23/2017    Hemochromatosis DNA-PCR(c282y,h63d)  Order: [id number]  Status:  Final result Visible to patient:  Yes (MyChart) Next appt:  10/06/2017 at 10:45 AM in Oncology [facility] 1) Dx:  Hereditary hemochromatosis ([place])  Component 3wk ago  Hemochromatosis Gene  Comment   Comment: Result: AFFECTED  Two copies of the same mutation (C282Y and C282Y) identified         RADIOGRAPHIC STUDIES: I have personally reviewed the radiological images as listed and agreed with the findings in the report. No results found.  ASSESSMENT & PLAN:   83 year old female with  #1 history of hemochromatosis . Homozygous C282Y mutation  Patient notes that she has never had a ferritin level mole in 1000. Family history positive. Her goal has been to keep her ferritin levels and 50. She has roughly needed a therapeutic phlebotomy every 6 months or so. Ferritin levels today are 65 with an iron saturation of 58%. Plan - The patient was counseled to pursue good oral hydration . -She will be scheduled for a therapeutic phlebotomy within this week and q 16months- per pt preference to keep ferritin <50 as long as this is tolerated well  -Labs and phlebotemy in 6 months with plan to see her back in clinic in 1 year -Scheduled for therapeutic phlebotomy now and in 6 months    #2 T1N0 node negative right breast cancer 1994, post mastectomy with nodes, 5 years of adjuvant tamoxifen. No clinical evidence of breast cancer recurrence at this time . Plan  - continue yearly mammograms with primary care physician  -She has required 3D tomo mammography due to extremely dense breast tissue.  Therapeutic Phlebotomy q6 months for ferritin >50 Labs q6 months RTC with Dr Irene Limbo in 12 months with labs   All of the patients questions were answered with apparent satisfaction. The patient knows to call the clinic with any problems, questions or concerns.  I spent 20 minutes counseling the patient face to face. The total time spent in the appointment was 20 minutes and more than 50% was on counseling and  direct patient cares.    Sullivan Lone MD [place] AAHIVMS [facility] Hematology/Oncology Physician [facility]  (Office):       [phone] (Work cell):  [phone] (Fax):            [phone]   This document serves as a record of services personally performed by Sullivan Lone, MD. It was created on his behalf by Alean Rinne, a trained medical scribe. The creation of this record is based on the scribe's personal observations and the provider's statements to them.   .I have reviewed the above documentation for accuracy and completeness, and I agree with the above. Brunetta Genera MD

## 2017-03-31 ENCOUNTER — Encounter: Payer: Self-pay | Admitting: *Deleted

## 2017-04-01 ENCOUNTER — Telehealth: Payer: Self-pay | Admitting: Hematology

## 2017-04-01 NOTE — Telephone Encounter (Signed)
Patient is aware of appt added for 12/27 per 12/16 sch message -

## 2017-04-02 ENCOUNTER — Encounter: Payer: Self-pay | Admitting: Family Medicine

## 2017-04-02 ENCOUNTER — Ambulatory Visit (INDEPENDENT_AMBULATORY_CARE_PROVIDER_SITE_OTHER): Payer: Medicare Other | Admitting: Family Medicine

## 2017-04-02 ENCOUNTER — Ambulatory Visit: Payer: Medicare Other | Admitting: Family

## 2017-04-02 VITALS — BP 120/70 | HR 85 | Temp 98.6°F | Wt 106.0 lb

## 2017-04-02 DIAGNOSIS — J029 Acute pharyngitis, unspecified: Secondary | ICD-10-CM | POA: Diagnosis not present

## 2017-04-02 LAB — POCT RAPID STREP A (OFFICE): RAPID STREP A SCREEN: NEGATIVE

## 2017-04-02 NOTE — Patient Instructions (Addendum)
Your test for Strep throat was negative.    You can get Mucinex from the drug store to help loosen up any phlegm in your throat.    Sore Throat A sore throat is pain, burning, irritation, or scratchiness in the throat. When you have a sore throat, you may feel pain or tenderness in your throat when you swallow or talk. Many things can cause a sore throat, including:  An infection.  Seasonal allergies.  Dryness in the air.  Irritants, such as smoke or pollution.  Gastroesophageal reflux disease (GERD).  A tumor.  A sore throat is often the first sign of another sickness. It may happen with other symptoms, such as coughing, sneezing, fever, and swollen neck glands. Most sore throats go away without medical treatment. Follow these instructions at home:  Take over-the-counter medicines only as told by your health care provider.  Drink enough fluids to keep your urine clear or pale yellow.  Rest as needed.  To help with pain, try: ? Sipping warm liquids, such as broth, herbal tea, or warm water. ? Eating or drinking cold or frozen liquids, such as frozen ice pops. ? Gargling with a salt-water mixture 3-4 times a day or as needed. To make a salt-water mixture, completely dissolve -1 tsp of salt in 1 cup of warm water. ? Sucking on hard candy or throat lozenges. ? Putting a cool-mist humidifier in your bedroom at night to moisten the air. ? Sitting in the bathroom with the door closed for 5-10 minutes while you run hot water in the shower.  Do not use any tobacco products, such as cigarettes, chewing tobacco, and e-cigarettes. If you need help quitting, ask your health care provider. Contact a health care provider if:  You have a fever for more than 2-3 days.  You have symptoms that last (are persistent) for more than 2-3 days.  Your throat does not get better within 7 days.  You have a fever and your symptoms suddenly get worse. Get help right away if:  You have  difficulty breathing.  You cannot swallow fluids, soft foods, or your saliva.  You have increased swelling in your throat or neck.  You have persistent nausea and vomiting. This information is not intended to replace advice given to you by your health care provider. Make sure you discuss any questions you have with your health care provider. Document Released: 05/07/2004 Document Revised: 11/24/2015 Document Reviewed: 01/18/2015 Elsevier Interactive Patient Education  [employer].

## 2017-04-02 NOTE — Progress Notes (Addendum)
Subjective:    Patient ID: Brandy Peterson, female    DOB: [birth year], 83 y.o.   MRN: [id number]  No chief complaint on file.   HPI Patient was seen today for acute concern.  Scratchy throat which turned into sore throat.  Patient endorses some sneezing, maybe some achiness, occasional cough.  Pt endorses having some phlegm.  Has tried TheraFlu.  Patient denies fever, chills, nausea, vomiting.  Denies sick contacts.  Patient feels he needs an antibiotic given her sore throat pain  Past Medical History:  Diagnosis Date  . Backache, unspecified   . Carcinoma of breast ([place])    Right  . Complication of anesthesia    was told to never use Sodium Pentothal - mother had reaction. Also had trouble with intubation during mastectomy  . COPD (chronic obstructive pulmonary disease) ([place])   . Family history of adverse reaction to anesthesia   . GERD (gastroesophageal reflux disease)   . Hemochromatosis   . History of shingles   . Hypertension   . Hypothyroidism   . IBS (irritable bowel syndrome)   . Plantar fasciitis   . Pneumonia   . Raynaud's syndrome   . Spastic colon    hx of   . Thyroid disease   . Varicose veins     Allergies  Allergen Reactions  . Contrast Media [Iodinated Diagnostic Agents] Hives, Itching and Other (See Comments)    Felt like she was going to faint   . Adhesive [Tape] Other (See Comments)    Prefers paper tape  . Sulfonamide Derivatives Other (See Comments)    Reaction: unknown     ROS General: Denies fever, chills, night sweats, changes in weight, changes in appetite  HEENT: Denies headaches, ear pain, changes in vision, rhinorrhea.    +sore throat, sneezing CV: Denies CP, palpitations, SOB, orthopnea Pulm: Denies SOB, cough, wheezing GI: Denies abdominal pain, nausea, vomiting, diarrhea, constipation GU: Denies dysuria, hematuria, frequency, vaginal discharge Msk: Denies muscle cramps, joint pains Neuro: Denies weakness, numbness, tingling Skin: Denies  rashes, bruising Psych: Denies depression, anxiety, hallucinations     Objective:    Blood pressure 120/70, pulse 85, temperature 98.6 F (37 C), temperature source Oral, weight 106 lb (48.1 kg), SpO2 95 %.   Gen. Pleasant, well-nourished, in no distress, normal affect  HEENT: [place]/AT, face symmetric, no scleral icterus, PERRLA, nares patent without drainage, pharynx with erythema, postnasal drainage, no exudate.  Mild cervical lymphadenopathy Lungs: no accessory muscle use, CTAB, no wheezes or rales Cardiovascular: RRR, no m/r/g, no peripheral edema Abdomen: BS present, soft, NT/ND Neuro:  A&Ox3, CN II-XII intact, normal gait Skin:  Warm, no lesions/ rash   Wt Readings from Last 3 Encounters:  04/02/17 106 lb (48.1 kg)  03/30/17 108 lb 8 oz (49.2 kg)  09/25/16 105 lb 3.2 oz (47.7 kg)    Assessment/Plan:  Sore throat  -Rapid strep negative -Supportive care encouraged.  Gargling with warm salt water or Chloraseptic spray suggested -mucinex if needed -Can take Tylenol as needed for pain/discomfort  F/u with pcp prn       Grier Mitts, MD

## 2017-04-02 NOTE — Addendum Note (Signed)
Addended by: Milford Cage on: 04/02/2017 04:29 PM   Modules accepted: Orders

## 2017-04-04 LAB — CULTURE, GROUP A STREP
MICRO NUMBER:: 81439746
SPECIMEN QUALITY:: ADEQUATE

## 2017-04-08 ENCOUNTER — Ambulatory Visit (INDEPENDENT_AMBULATORY_CARE_PROVIDER_SITE_OTHER): Payer: Medicare Other | Admitting: Family Medicine

## 2017-04-08 ENCOUNTER — Ambulatory Visit (HOSPITAL_BASED_OUTPATIENT_CLINIC_OR_DEPARTMENT_OTHER): Payer: Medicare Other

## 2017-04-08 ENCOUNTER — Encounter: Payer: Self-pay | Admitting: Family Medicine

## 2017-04-08 VITALS — BP 142/86 | HR 86 | Temp 98.2°F | Ht 61.0 in | Wt 106.0 lb

## 2017-04-08 DIAGNOSIS — J0110 Acute frontal sinusitis, unspecified: Secondary | ICD-10-CM

## 2017-04-08 MED ORDER — AZITHROMYCIN 250 MG PO TABS: ORAL_TABLET | ORAL | 0 refills | Status: DC

## 2017-04-08 NOTE — Progress Notes (Signed)
Brandy Peterson - 83 y.o. female MRN [id number]  Date of birth: May 22, 1930  SUBJECTIVE:  Including CC & ROS.  Chief Complaint  Patient presents with  . Sinus pressure    Brandy Peterson  is a 83 y.o. female that is presenting with sinus pressure. Ongoing for one week. Denies body aches and fevers. Patient states she has ongoing drainage. Has not taken anything for her symptoms.Her symptoms seem to be staying the same. Has not taken many OTC medications. Has not had any sick contacts. Has received the flu vaccine.    Review of Systems  Constitutional: Negative for fever.  HENT: Positive for rhinorrhea and sinus pressure.   Respiratory: Negative for shortness of breath.   Cardiovascular: Negative for chest pain.  Gastrointestinal: Negative for abdominal pain.  Musculoskeletal: Negative for back pain.  Skin: Negative for color change.    HISTORY: Past Medical, Surgical, Social, and Family History Reviewed & Updated per EMR.   Pertinent Historical Findings include:  Past Medical History:  Diagnosis Date  . Backache, unspecified   . Carcinoma of breast ([place])    Right  . Complication of anesthesia    was told to never use Sodium Pentothal - mother had reaction. Also had trouble with intubation during mastectomy  . COPD (chronic obstructive pulmonary disease) ([place])   . Family history of adverse reaction to anesthesia   . GERD (gastroesophageal reflux disease)   . Hemochromatosis   . History of shingles   . Hypertension   . Hypothyroidism   . IBS (irritable bowel syndrome)   . Plantar fasciitis   . Pneumonia   . Raynaud's syndrome   . Spastic colon    hx of   . Thyroid disease   . Varicose veins     Past Surgical History:  Procedure Laterality Date  . ABDOMINAL HYSTERECTOMY  1979   PARTIAL  . APPENDECTOMY    . BONE SPUR  1970   BOTH FEET  . CATARACT EXTRACTION  10/2012 and 12/2012   bilateral with lens implants  . COLONOSCOPY    . KYPHOPLASTY N/A 10/19/2014   Procedure: Lumbar  1 kyphoplasty;  Surgeon: Jovita Gamma, MD;  Location: [facility];  Service: Neurosurgery;  Laterality: N/A;  . LUMBAR LAMINECTOMY  1991   L5-S1  . MASTECTOMY  1994   RIGHT  . MASTECTOMY  1994   Right    Allergies  Allergen Reactions  . Contrast Media [Iodinated Diagnostic Agents] Hives, Itching and Other (See Comments)    Felt like she was going to faint   . Adhesive [Tape] Other (See Comments)    Prefers paper tape  . Sulfonamide Derivatives Other (See Comments)    Reaction: unknown     Family History  Problem Relation Age of Onset  . Cancer Mother        metastatic cancer (pancreas vs lung)  . Hypertension Mother   . Osteoarthritis Mother   . Hypothyroidism Mother   . Varicose Veins Mother   . Bleeding Disorder Mother   . Heart disease Father        CAD/MI  . Hypertension Father   . Arthritis Father   . Hemochromatosis Father        Possible  . Deep vein thrombosis Father   . Varicose Veins Father   . Heart attack Father   . Heart attack Brother   . Hemochromatosis Brother   . Heart disease Brother   . Hyperlipidemia Son   . Diabetes Neg Hx  Social History   Socioeconomic History  . Marital status: Widowed    Spouse name: Not on file  . Number of children: Not on file  . Years of education: Not on file  . Highest education level: Not on file  Social Needs  . Financial resource strain: Not on file  . Food insecurity - worry: Not on file  . Food insecurity - inability: Not on file  . Transportation needs - medical: Not on file  . Transportation needs - non-medical: Not on file  Occupational History  . Not on file  Tobacco Use  . Smoking status: Former Smoker    Types: Cigarettes    Last attempt to quit: 04/13/1988    Years since quitting: 29.0  . Smokeless tobacco: Never Used  . Tobacco comment: approximate quit date  Substance and Sexual Activity  . Alcohol use: Yes    Alcohol/week: 1.8 oz    Types: 3 Glasses of wine per week    Comment:  2 OZ, 5 TIMES A WEEK  . Drug use: No  . Sexual activity: No  Other Topics Concern  . 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 She now lives alone I-ADLs. Pt has 1 son born 26 and 2 grandchildren.       Pt had a colonoscopy on 05/09/2012 by Dr Oletta Lamas.       Pt reports having a pneumo vaccine.     PHYSICAL EXAM:  VS: BP (!) 142/86 (BP Location: Left Arm, Patient Position: Sitting, Cuff Size: Normal)   Pulse 86   Temp 98.2 F (36.8 C) (Oral)   Ht 5\' 1"  (1.549 m)   Wt 106 lb (48.1 kg)   SpO2 98%   BMI 20.03 kg/m  Physical Exam Gen: NAD, alert, cooperative with exam,  ENT: normal lips, normal nasal mucosa, tympanic membranes clear and intact bilaterally, normal oropharynx, no cervical lymphadenopathy Eye: normal EOM, normal conjunctiva and lids CV:  no edema, +2 pedal pulses, regular rate and rhythm, S1-S2   Resp: no accessory muscle use, non-labored, clear to auscultation bilaterally, no crackles or wheezes GI: no masses or tenderness, no hernia  Skin: no rashes, no areas of induration  Neuro: normal tone, normal sensation to touch Psych:  normal insight, alert and oriented MSK: Normal gait, normal strength       ASSESSMENT & PLAN:   Acute non-recurrent frontal sinusitis Most likely related to a viral origin  - printed rx of azithro to have on hand if no improvement  - counseled on supportive care

## 2017-04-08 NOTE — Progress Notes (Signed)
Brandy Peterson presents today for phlebotomy per MD orders. 16 G phlebotomy kit used in L AC.  Phlebotomy procedure started at 0915 and ended at 0921 515 cc removed. Patient tolerated procedure well. IV needle removed intact.

## 2017-04-08 NOTE — Patient Instructions (Signed)
Please try things such as zyrtec-D or allegra-D which is an antihistamine and decongestant.   Please try afrin which will help with nasal congestion but use for only three days.   Please also try using a netti pot on a regular occasion.  Honey can help with a sore throat.

## 2017-04-08 NOTE — Patient Instructions (Signed)

## 2017-04-09 ENCOUNTER — Other Ambulatory Visit: Payer: Self-pay | Admitting: Internal Medicine

## 2017-04-09 DIAGNOSIS — J0110 Acute frontal sinusitis, unspecified: Secondary | ICD-10-CM | POA: Insufficient documentation

## 2017-04-09 NOTE — Assessment & Plan Note (Signed)
Most likely related to a viral origin  - printed rx of azithro to have on hand if no improvement  - counseled on supportive care

## 2017-05-25 DIAGNOSIS — R922 Inconclusive mammogram: Secondary | ICD-10-CM | POA: Diagnosis not present

## 2017-05-25 DIAGNOSIS — Z853 Personal history of malignant neoplasm of breast: Secondary | ICD-10-CM | POA: Diagnosis not present

## 2017-06-16 ENCOUNTER — Ambulatory Visit (INDEPENDENT_AMBULATORY_CARE_PROVIDER_SITE_OTHER): Payer: Medicare Other | Admitting: Internal Medicine

## 2017-06-16 ENCOUNTER — Encounter: Payer: Self-pay | Admitting: Internal Medicine

## 2017-06-16 ENCOUNTER — Ambulatory Visit (INDEPENDENT_AMBULATORY_CARE_PROVIDER_SITE_OTHER): Payer: Medicare Other | Admitting: *Deleted

## 2017-06-16 ENCOUNTER — Other Ambulatory Visit (INDEPENDENT_AMBULATORY_CARE_PROVIDER_SITE_OTHER): Payer: Medicare Other

## 2017-06-16 VITALS — BP 162/72 | HR 78 | Temp 97.6°F | Resp 18 | Ht 61.0 in | Wt 106.0 lb

## 2017-06-16 VITALS — BP 162/72 | HR 78 | Temp 97.6°F | Ht 61.0 in | Wt 106.0 lb

## 2017-06-16 DIAGNOSIS — Z0000 Encounter for general adult medical examination without abnormal findings: Secondary | ICD-10-CM | POA: Diagnosis not present

## 2017-06-16 DIAGNOSIS — L989 Disorder of the skin and subcutaneous tissue, unspecified: Secondary | ICD-10-CM

## 2017-06-16 DIAGNOSIS — E039 Hypothyroidism, unspecified: Secondary | ICD-10-CM | POA: Diagnosis not present

## 2017-06-16 DIAGNOSIS — R1084 Generalized abdominal pain: Secondary | ICD-10-CM | POA: Diagnosis not present

## 2017-06-16 DIAGNOSIS — I10 Essential (primary) hypertension: Secondary | ICD-10-CM

## 2017-06-16 LAB — CBC WITH DIFFERENTIAL/PLATELET
BASOS PCT: 0.3 % (ref 0.0–3.0)
Basophils Absolute: 0 10*3/uL (ref 0.0–0.1)
EOS ABS: 0.3 10*3/uL (ref 0.0–0.7)
Eosinophils Relative: 3.1 % (ref 0.0–5.0)
HCT: 45.1 % (ref 36.0–46.0)
Hemoglobin: 15.8 g/dL — ABNORMAL HIGH (ref 12.0–15.0)
Lymphocytes Relative: 18.1 % (ref 12.0–46.0)
Lymphs Abs: 1.5 10*3/uL (ref 0.7–4.0)
MCHC: 35 g/dL (ref 30.0–36.0)
MCV: 92.9 fl (ref 78.0–100.0)
MONO ABS: 0.7 10*3/uL (ref 0.1–1.0)
Monocytes Relative: 8.3 % (ref 3.0–12.0)
NEUTROS ABS: 5.8 10*3/uL (ref 1.4–7.7)
Neutrophils Relative %: 70.2 % (ref 43.0–77.0)
PLATELETS: 281 10*3/uL (ref 150.0–400.0)
RBC: 4.86 Mil/uL (ref 3.87–5.11)
RDW: 12.4 % (ref 11.5–15.5)
WBC: 8.2 10*3/uL (ref 4.0–10.5)

## 2017-06-16 LAB — BASIC METABOLIC PANEL
BUN: 16 mg/dL (ref 6–23)
CHLORIDE: 98 meq/L (ref 96–112)
CO2: 32 meq/L (ref 19–32)
Calcium: 9.8 mg/dL (ref 8.4–10.5)
Creatinine, Ser: 0.69 mg/dL (ref 0.40–1.20)
GFR: 86.06 mL/min (ref >60.00)
GLUCOSE: 88 mg/dL (ref 70–99)
POTASSIUM: 3.9 meq/L (ref 3.5–5.1)
Sodium: 135 mEq/L (ref 135–145)

## 2017-06-16 LAB — LIPID PANEL
CHOL/HDL RATIO: 2
CHOLESTEROL: 193 mg/dL (ref 0–200)
HDL: 85.8 mg/dL (ref >39.00)
LDL CALC: 98 mg/dL (ref 0–99)
NonHDL: 107.38
Triglycerides: 48 mg/dL (ref 0.0–149.0)
VLDL: 9.6 mg/dL (ref 0.0–40.0)

## 2017-06-16 LAB — URINALYSIS, ROUTINE W REFLEX MICROSCOPIC
Bilirubin Urine: NEGATIVE
Hgb urine dipstick: NEGATIVE
KETONES UR: NEGATIVE
Leukocytes, UA: NEGATIVE
Nitrite: NEGATIVE
PH: 6.5 (ref 5.0–8.0)
Specific Gravity, Urine: 1.01 (ref 1.000–1.030)
Total Protein, Urine: NEGATIVE
Urine Glucose: NEGATIVE
Urobilinogen, UA: 0.2 (ref 0.0–1.0)
WBC UA: NONE SEEN (ref 0–?)

## 2017-06-16 LAB — HEPATIC FUNCTION PANEL
ALT: 15 U/L (ref 0–35)
AST: 21 U/L (ref 0–37)
Albumin: 4.3 g/dL (ref 3.5–5.2)
Alkaline Phosphatase: 89 U/L (ref 39–117)
BILIRUBIN DIRECT: 0.2 mg/dL (ref 0.0–0.3)
BILIRUBIN TOTAL: 0.7 mg/dL (ref 0.2–1.2)
TOTAL PROTEIN: 7.4 g/dL (ref 6.0–8.3)

## 2017-06-16 LAB — TSH: TSH: 0.46 u[IU]/mL (ref 0.35–4.50)

## 2017-06-16 MED ORDER — TRIAMTERENE-HCTZ 37.5-25 MG PO TABS: 1.0000 | ORAL_TABLET | Freq: Every day | ORAL | 3 refills | Status: DC

## 2017-06-16 MED ORDER — OMEPRAZOLE 20 MG PO CPDR: 20.0000 mg | DELAYED_RELEASE_CAPSULE | Freq: Every day | ORAL | 3 refills | Status: DC | PRN

## 2017-06-16 MED ORDER — METOPROLOL SUCCINATE ER 25 MG PO TB24: 25.0000 mg | ORAL_TABLET | Freq: Every day | ORAL | 3 refills | Status: DC

## 2017-06-16 MED ORDER — LEVOTHYROXINE SODIUM 100 MCG PO TABS: 100.0000 ug | ORAL_TABLET | Freq: Every day | ORAL | 3 refills | Status: DC

## 2017-06-16 MED ORDER — ZOSTER VAC RECOMB ADJUVANTED 50 MCG/0.5ML IM SUSR: 0.5000 mL | Freq: Once | INTRAMUSCULAR | 1 refills | Status: AC

## 2017-06-16 NOTE — Progress Notes (Addendum)
Subjective:   Brandy Peterson is a 84 y.o. female who presents for an Initial Medicare Annual Wellness Visit. Patient states that she has had changes in bowel pattern and feels like her abdomen is bloated. She would like to have Cologuard test.  Review of Systems    No ROS.  Medicare Wellness Visit. Additional risk factors are reflected in the social history.   Cardiac Risk Factors include: advanced age (>64men, >62 women);dyslipidemia Sleep patterns: has frequent nighttime awakenings, gets up 3 times nightly to void and sleeps 7-8 hours nightly.    Home Safety/Smoke Alarms: Feels safe in home. Smoke alarms in place.  Living environment; residence and Firearm Safety: 1-story house/ trailer, no firearms. Lives alone, no needs for DME, good support system Seat Belt Safety/Bike Helmet: Wears seat belt.    Objective:    Today's Vitals   06/16/17 1333 06/16/17 1336  BP: (!) 162/72   Pulse: 78   Resp: 18   Temp: 97.6 F (36.4 C)   SpO2: 98%   Weight: 106 lb (48.1 kg)   Height: 5\' 1"  (1.549 m)   PainSc:  2    Body mass index is 20.03 kg/m.  Advanced Directives 06/16/2017 10/08/2014 10/04/2014 04/12/2014  Does Patient Have a Medical Advance Directive? No Yes Yes No  Type of Advance Directive - Healthcare Power of [place] -  Does patient want to make changes to medical advance directive? No - Patient declined - - -  Copy of [place] in Chart? - No - copy requested No - copy requested -  Would patient like information on creating a medical advance directive? - - - No - patient declined information    Current Medications (verified) Outpatient Encounter Medications as of 06/16/2017  Medication Sig  . fluticasone (FLONASE) 50 MCG/ACT nasal spray Place 1 spray into both nostrils daily.  . [DISCONTINUED] levothyroxine (SYNTHROID, LEVOTHROID) 100 MCG tablet TAKE ONE TABLET BY MOUTH ONCE DAILY  . [DISCONTINUED] omeprazole (PRILOSEC) 20 MG  capsule Take 1 capsule (20 mg total) by mouth daily as needed (heartburn).  . [DISCONTINUED] triamterene-hydrochlorothiazide (MAXZIDE-25) 37.5-25 MG tablet TAKE ONE TABLET BY MOUTH ONCE DAILY  . [DISCONTINUED] azithromycin (ZITHROMAX) 250 MG tablet Take 500 mg the first day then 250 mg the next 4 days. Total of 5 days. (Patient not taking: Reported on 06/16/2017)  . [DISCONTINUED] cholecalciferol (VITAMIN D) 1000 units tablet Take 1,000 Units by mouth daily.   No facility-administered encounter medications on file as of 06/16/2017.     Allergies (verified) Contrast media [iodinated diagnostic agents]; Adhesive [tape]; and Sulfonamide derivatives   History: Past Medical History:  Diagnosis Date  . Backache, unspecified   . Carcinoma of breast ([place])    Right  . Complication of anesthesia    was told to never use Sodium Pentothal - mother had reaction. Also had trouble with intubation during mastectomy  . COPD (chronic obstructive pulmonary disease) ([place])   . Family history of adverse reaction to anesthesia   . GERD (gastroesophageal reflux disease)   . Hemochromatosis   . History of shingles   . Hypertension   . Hypothyroidism   . IBS (irritable bowel syndrome)   . Plantar fasciitis   . Pneumonia   . Raynaud's syndrome   . Spastic colon    hx of   . Thyroid disease   . Varicose veins    Past Surgical History:  Procedure Laterality Date  . ABDOMINAL HYSTERECTOMY  1979  PARTIAL  . APPENDECTOMY    . BONE SPUR  1970   BOTH FEET  . CATARACT EXTRACTION  10/2012 and 12/2012   bilateral with lens implants  . COLONOSCOPY    . KYPHOPLASTY N/A 10/19/2014   Procedure: Lumbar 1 kyphoplasty;  Surgeon: Jovita Gamma, MD;  Location: [facility];  Service: Neurosurgery;  Laterality: N/A;  . LUMBAR LAMINECTOMY  1991   L5-S1  . MASTECTOMY  1994   RIGHT  . MASTECTOMY  1994   Right   Family History  Problem Relation Age of Onset  . Cancer Mother        metastatic cancer (pancreas vs  lung)  . Hypertension Mother   . Osteoarthritis Mother   . Hypothyroidism Mother   . Varicose Veins Mother   . Bleeding Disorder Mother   . Heart disease Father        CAD/MI  . Hypertension Father   . Arthritis Father   . Hemochromatosis Father        Possible  . Deep vein thrombosis Father   . Varicose Veins Father   . Heart attack Father   . Heart attack Brother   . Hemochromatosis Brother   . Heart disease Brother   . Hyperlipidemia Son   . Diabetes Neg Hx    Social History   Socioeconomic History  . Marital status: Widowed    Spouse name: None  . Number of children: None  . Years of education: None  . Highest education level: None  Social Needs  . Financial resource strain: Not hard at all  . Food insecurity - worry: Never true  . Food insecurity - inability: Never true  . Transportation needs - medical: No  . Transportation needs - non-medical: No  Occupational History  . None  Tobacco Use  . Smoking status: Former Smoker    Types: Cigarettes    Last attempt to quit: 04/13/1988    Years since quitting: 29.1  . Smokeless tobacco: Never Used  . Tobacco comment: approximate quit date  Substance and Sexual Activity  . Alcohol use: Yes    Alcohol/week: 1.8 oz    Types: 3 Glasses of wine per week    Comment: 2 OZ, 5 TIMES A WEEK  . Drug use: No  . Sexual activity: No  Other Topics Concern  . 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 She now lives alone I-ADLs. Pt has 1 son born 45 and 2 grandchildren.       Pt had a colonoscopy on 05/09/2012 by Dr Oletta Lamas.       Pt reports having a pneumo vaccine.    Tobacco Counseling Counseling given: Not Answered Comment: approximate quit date  Activities of Daily Living In your present state of health, do you have any difficulty performing the following activities: 06/16/2017  Hearing? N  Vision? N  Difficulty  concentrating or making decisions? N  Walking or climbing stairs? N  Dressing or bathing? N  Doing errands, shopping? N  Preparing Food and eating ? N  Using the Toilet? N  In the past six months, have you accidently leaked urine? Y  Do you have problems with loss of bowel control? Y  Managing your Medications? N  Managing your Finances? N  Housekeeping or managing your Housekeeping? N  Some recent data might be hidden     Immunizations and Health Maintenance Immunization History  Administered Date(s) Administered  . H1N1 03/21/2008  . Influenza Split 12/29/2011  . Influenza Whole 12/24/2009  . Influenza, High Dose Seasonal PF 01/21/2016, 01/21/2017  . Influenza,inj,Quad PF,6+ Mos 01/16/2014  . Influenza-Unspecified 01/02/2013, 03/11/2015  . Pneumococcal Conjugate-13 05/02/2013  . Pneumococcal Polysaccharide-23 01/25/2002  . Tdap 04/24/2005, 04/05/2015   There are no preventive care reminders to display for this patient.  Patient Care Team: Biagio Borg, MD as PCP - General (Internal Medicine) Gordy Levan, MD (Hematology and Oncology) Druscilla Brownie, MD (Dermatology) Laurence Spates, MD (Gastroenterology)  Indicate any recent Medical Services you may have received from other than [facility] providers in the past year (date may be approximate).     Assessment:   This is a routine wellness examination for [facility]. Physical assessment deferred to PCP.   Hearing/Vision screen Hearing Screening Comments: Able to hear conversational tones w/o difficulty. No issues reported.  Passed whisper test Vision Screening Comments: appointment yearly  Dietary issues and exercise activities discussed: Current Exercise Habits: The patient does not participate in regular exercise at present(patient reports she stays active maintaining her home, mowing and yard work), Intensity: Mild, Exercise limited by: None identified Diet (meal preparation, eat out, water intake, caffeinated  beverages, dairy products, fruits and vegetables): in general, a "healthy" diet  , well balanced   Reviewed heart healthy diet, encouraged patient to increase daily water intake.  Goals    . Patient Stated     Stay as healthy and as independent as possible. Continue to be active, doing house-work and driving. Continue to cook, enjoy life and family. Stay active with the activities in my church.      Depression Screen PHQ 2/9 Scores 06/16/2017 05/13/2016 05/15/2015 11/13/2014 04/11/2014  PHQ - 2 Score 0 1 0 0 0  PHQ- 9 Score 1 - - - -    Fall Risk Fall Risk  06/16/2017 05/13/2016 05/15/2015 11/13/2014 04/11/2014  Falls in the past year? No No Yes No No  Number falls in past yr: - - 1 - -  Injury with Fall? - - No - -   Cognitive Function: MMSE - Mini Mental State Exam 06/16/2017  Orientation to time 5  Orientation to Place 5  Registration 3  Attention/ Calculation 5  Recall 3  Language- name 2 objects 2  Language- repeat 1  Language- follow 3 step command 3  Language- read & follow direction 1  Write a sentence 1  Copy design 1  Total score 30        Screening Tests Health Maintenance  Topic Date Due  . DEXA SCAN  06/17/2018 (Originally 01/05/1998)  . TETANUS/TDAP  04/04/2025  . INFLUENZA VACCINE  Completed  . PNA vac Low Risk Adult  Completed     Plan:     Nurse reported bowel changes, abdominal bloating, and patient's desire to have Cologuard test to PCP's CMA Shir[place] before  rooming patient for Dr. Jenny Reichmann to see.   Continue doing brain stimulating activities (puzzles, reading, adult coloring books, staying active) to keep memory sharp.   Continue to eat heart healthy diet (full of fruits, vegetables, whole grains, lean protein, water--limit salt, fat, and sugar intake) and increase physical activity as tolerated.  I have personally reviewed and noted the following in the patient's chart:   . Medical and social history . Use of alcohol, tobacco or illicit drugs  . Current  medications and supplements . Functional ability and status . Nutritional status . Physical activity . Advanced directives .  List of other physicians . Vitals . Screenings to include cognitive, depression, and falls . Referrals and appointments  In addition, I have reviewed and discussed with patient certain preventive protocols, quality metrics, and best practice recommendations. A written personalized care plan for preventive services as well as general preventive health recommendations were provided to patient.     Michiel Cowboy, RN   06/16/2017   Medical screening examination/treatment/procedure(s) were performed by non-physician practitioner and as supervising physician I was immediately available for consultation/collaboration. I agree with above. [name], MD

## 2017-06-16 NOTE — Patient Instructions (Addendum)
Continue doing brain stimulating activities (puzzles, reading, adult coloring books, staying active) to keep memory sharp.   Continue to eat heart healthy diet (full of fruits, vegetables, whole grains, lean protein, water--limit salt, fat, and sugar intake) and increase physical activity as tolerated.  Brandy Peterson , Thank you for taking time to come for your Medicare Wellness Visit. I appreciate your ongoing commitment to your health goals. Please review the following plan we discussed and let me know if I can assist you in the future.   These are the goals we discussed: Goals    . Patient Stated     Stay as healthy and as independent as possible. Continue to be active, doing house-work and driving. Continue to cook, enjoy life and family. Stay active with the activities in my church.       This is a list of the screening recommended for you and due dates:  Health Maintenance  Topic Date Due  . DEXA scan (bone density measurement)  06/17/2018*  . Tetanus Vaccine  04/04/2025  . Flu Shot  Completed  . Pneumonia vaccines  Completed  *Topic was postponed. The date shown is not the original due date.   It is important to avoid accidents which may result in broken bones.  Here are a few ideas on how to make your home safer so you will be less likely to trip or fall.  1. Use nonskid mats or non slip strips in your shower or tub, on your bathroom floor and around sinks.  If you know that you have spilled water, wipe it up! 2. In the bathroom, it is important to have properly installed grab bars on the walls or on the edge of the tub.  Towel racks are NOT strong enough for you to hold onto or to pull on for support. 3. Stairs and hallways should have enough light.  Add lamps or night lights if you need ore light. 4. It is good to have handrails on both sides of the stairs if possible.  Always fix broken handrails right away. 5. It is important to see the edges of steps.  Paint the edges of outdoor  steps white so you can see them better.  Put colored tape on the edge of inside steps. 6. Throw-rugs are dangerous because they can slide.  Removing the rugs is the best idea, but if they must stay, add adhesive carpet tape to prevent slipping. 7. Do not keep things on stairs or in the halls.  Remove small furniture that blocks the halls as it may cause you to trip.  Keep telephone and electrical cords out of the way where you walk. 8. Always were sturdy, rubber-soled shoes for good support.  Never wear just socks, especially on the stairs.  Socks may cause you to slip or fall.  Do not wear full-length housecoats as you can easily trip on the bottom.  9. Place the things you use the most on the shelves that are the easiest to reach.  If you use a stepstool, make sure it is in good condition.  If you feel unsteady, DO NOT climb, ask for help. 10. If a health professional advises you to use a cane or walker, do not be ashamed.  These items can keep you from falling and breaking your bones.

## 2017-06-16 NOTE — Progress Notes (Signed)
Subjective:    Patient ID: Brandy Peterson, female    DOB: December 10, 1930, 84 y.o.   MRN: [id number]  HPI  Here to f/u; overall doing ok,  Pt denies chest pain, increasing sob or doe, wheezing, orthopnea, PND, increased LE swelling, palpitations, dizziness or syncope.  Pt denies new neurological symptoms such as new headache, or facial or extremity weakness or numbness.  Pt denies polydipsia, polyuria, or low sugar episode.  Pt states overall good compliance with meds, mostly trying to follow appropriate diet, with wt overall stable,  but little exercise however.  Denies worsening reflux, dysphagia, n/v, or blood except for crampy abd pains intermittent for several months with alternating diarrhea and constipation, asking for cologuard, has seen Dr Charolotte Capuchin in the past for this, quite nervous today.  BP usually < 140/90 at home, does not want med change at this time.  Also has several wks of new scaly lesion to right hand in the first web space, wondering about skin cancer. Denies hyper or hypo thyroid symptoms such as voice, skin or hair change. Past Medical History:  Diagnosis Date  . Backache, unspecified   . Carcinoma of breast ([place])    Right  . Complication of anesthesia    was told to never use Sodium Pentothal - mother had reaction. Also had trouble with intubation during mastectomy  . COPD (chronic obstructive pulmonary disease) ([place])   . Family history of adverse reaction to anesthesia   . GERD (gastroesophageal reflux disease)   . Hemochromatosis   . History of shingles   . Hypertension   . Hypothyroidism   . IBS (irritable bowel syndrome)   . Plantar fasciitis   . Pneumonia   . Raynaud's syndrome   . Spastic colon    hx of   . Thyroid disease   . Varicose veins    Past Surgical History:  Procedure Laterality Date  . ABDOMINAL HYSTERECTOMY  1979   PARTIAL  . APPENDECTOMY    . BONE SPUR  1970   BOTH FEET  . CATARACT EXTRACTION  10/2012 and 12/2012   bilateral with lens  implants  . COLONOSCOPY    . KYPHOPLASTY N/A 10/19/2014   Procedure: Lumbar 1 kyphoplasty;  Surgeon: Jovita Gamma, MD;  Location: [facility];  Service: Neurosurgery;  Laterality: N/A;  . LUMBAR LAMINECTOMY  1991   L5-S1  . MASTECTOMY  1994   RIGHT  . MASTECTOMY  1994   Right    reports that she quit smoking about 29 years ago. Her smoking use included cigarettes. she has never used smokeless tobacco. She reports that she drinks about 1.8 oz of alcohol per week. She reports that she does not use drugs. family history includes Arthritis in her father; Bleeding Disorder in her mother; Cancer in her mother; Deep vein thrombosis in her father; Heart attack in her brother and father; Heart disease in her brother and father; Hemochromatosis in her brother and father; Hyperlipidemia in her son; Hypertension in her father and mother; Hypothyroidism in her mother; Osteoarthritis in her mother; Varicose Veins in her father and mother. Allergies  Allergen Reactions  . Contrast Media [Iodinated Diagnostic Agents] Hives, Itching and Other (See Comments)    Felt like she was going to faint   . Adhesive [Tape] Other (See Comments)    Prefers paper tape  . Sulfonamide Derivatives Other (See Comments)    Reaction: unknown    No current outpatient medications on file prior to visit.   No  current facility-administered medications on file prior to visit.    Review of Systems  Constitutional: Negative for other unusual diaphoresis or sweats HENT: Negative for ear discharge or swelling Eyes: Negative for other worsening visual disturbances Respiratory: Negative for stridor or other swelling  Gastrointestinal: Negative for worsening distension or other blood Genitourinary: Negative for retention or other urinary change Musculoskeletal: Negative for other MSK pain or swelling Skin: Negative for color change or other new lesions Neurological: Negative for worsening tremors and other numbness    Psychiatric/Behavioral: Negative for worsening agitation or other fatigue All other system neg per pt    Objective:   Physical Exam BP (!) 162/72   Pulse 78   Temp 97.6 F (36.4 C) (Oral)   Ht 5\' 1"  (1.549 m)   Wt 106 lb (48.1 kg)   SpO2 98%   BMI 20.03 kg/m  VS noted,  Constitutional: Pt appears in NAD HENT: Head: NCAT.  Right Ear: External ear normal.  Left Ear: External ear normal.  Eyes: . Pupils are equal, round, and reactive to light. Conjunctivae and EOM are normal Nose: without d/c or deformity Neck: Neck supple. Gross normal ROM Cardiovascular: Normal rate and regular rhythm.   Pulmonary/Chest: Effort normal and breath sounds without rales or wheezing.  Abd:  Soft, NT, ND, + BS, no organomegaly - benign exam Neurological: Pt is alert. At baseline orientation, motor grossly intact Skin: Skin is warm. No rashes, no LE edema, right hand with scaly raised lesion < 1/2 cm to the first web space Psychiatric: Pt behavior is normal without agitation  No other exam findings     Assessment & Plan:

## 2017-06-16 NOTE — Patient Instructions (Addendum)
Brandy Peterson will asssit with referral for Cologuard  You will be contacted regarding the referral for: Dermatology  Your [facility]ingles shot [facility] [address]  Please take all new medication as prescribed - the Toprol XL 25 mg per day ([facility])  Please continue all other medications as before, and refills have been done if requested to [facility]  Please have the pharmacy call with any other refills you may need.  Please continue your efforts at being more active, low cholesterol diet, and weight control.  You are otherwise up to date with prevention measures today.  Please keep your appointments with your specialists as you may have planned  Please go to the LAB in the Basement (turn left off the elevator) for the tests to be done today  You will be contacted by phone if any changes need to be made immediately.  Otherwise, you will receive a letter about your results with an explanation, but please check with MyChart first.  Please remember to sign up for MyChart if you have not done so, as this will be important to you in the future with finding out test results, communicating by private email, and scheduling acute appointments online when needed.  Please return in 6 months, or sooner if needed

## 2017-06-17 ENCOUNTER — Encounter: Payer: Self-pay | Admitting: Internal Medicine

## 2017-06-17 ENCOUNTER — Telehealth: Payer: Self-pay

## 2017-06-17 MED ORDER — FLUTICASONE PROPIONATE 50 MCG/ACT NA SUSP: 1.0000 | Freq: Every day | NASAL | 3 refills | Status: DC

## 2017-06-17 NOTE — Telephone Encounter (Signed)
Patient calling and stating that [facility] [address] [facility] and states that metoprolol succinate (TOPROL-XL) 25 MG 24 hr tablet was sent to them. Prilosec should have not been sent to [facility], but flonase should have been sent. Would like a call back, [phone].

## 2017-06-17 NOTE — Telephone Encounter (Signed)
Spoke with pt concerning her med questions from the [employer]. I reassured her that the medications have been sent to the mail order pharmacy and only the shingles vaccination was sent to the local pharmacy. She expressed understanding.

## 2017-06-18 ENCOUNTER — Telehealth: Payer: Self-pay

## 2017-06-18 NOTE — Telephone Encounter (Signed)
Copied from [place] [id number]. Topic: General - Other >> Jun 18, 2017  3:05 PM Yvette Rack wrote: Reason for CRM: patient stating that she doesn't want to speak with anyone but Dr [name] no nurse or assistant she states that its about her blood pressure medicine triamterene-hydrochlorothiazide (MAXZIDE-25) 37.5-25 MG tablet that was [facility] [place] [place] [facility] [phone] (Phone) [phone] (Fax)    she would like to speak with him before he leaves today    >> Jun 18, 2017  5:01 PM Stovall, Shana A wrote: Pt called in and said that [facility] a question about her triamterene-hydrochlorothiazide (MAXZIDE-25) 37.5-25 MG tablet [[id number]]  She is requesting someone call [facility] and answer what questions they have about this med

## 2017-06-18 NOTE — Telephone Encounter (Signed)
Due to Dr. Jenny Reichmann being in clinic he was not able to speak with patient directly so I reached out to the patient and explained that to her as it was very much to her disappointment. She stated [facility] stating they needed more information in regards to her Maxzide. I told her I would reach out to them to see what additional information they needed, however, she was more concerned about what PCP did not return her call. She then stated that the mail order faxed over something to the office yesterday. I apologized and stated that we did not receive any information regarding her medication yesterday or today unfortunately.   Upon speaking to the representative at [facility] I found out that the medication had a contraindication with an allergy. However I checked with Dr. Jenny Reichmann and he stated that it was ok for the patient to proceed with taking the medication and I relayed that information to the pharmacy tech at [facility].    Copied from [place] [phone]. Topic: General - Other >> Jun 18, 2017  3:05 PM Yvette Rack wrote: Reason for CRM: patient stating that she doesn't want to speak with anyone but Dr [name] no nurse or assistant she states that its about her blood pressure medicine triamterene-hydrochlorothiazide (MAXZIDE-25) 37.5-25 MG tablet that was [facility] [place] [place] to [facility] [phone] (Phone) [phone] (Fax)    she would like to speak with him before he leaves today

## 2017-06-19 DIAGNOSIS — L989 Disorder of the skin and subcutaneous tissue, unspecified: Secondary | ICD-10-CM | POA: Insufficient documentation

## 2017-06-19 NOTE — Assessment & Plan Note (Signed)
Mod to severe elevated, o/wn stable overall by history and exam, recent data reviewed with pt, and pt to continue medical treatment as before,  to f/u any worsening symptoms or concerns BP Readings from Last 3 Encounters:  06/16/17 (!) 162/72  06/16/17 (!) 162/72  04/08/17 (!) 142/86

## 2017-06-19 NOTE — Assessment & Plan Note (Signed)
Lab Results  Component Value Date   TSH 0.46 06/16/2017  stable overall by history and exam, recent data reviewed with pt, and pt to continue medical treatment as before,  to f/u any worsening symptoms or concerns

## 2017-06-19 NOTE — Assessment & Plan Note (Signed)
With alternating diarrhea and constipation, for probiotic, f/u GI as planned

## 2017-06-19 NOTE — Assessment & Plan Note (Addendum)
For dermatology referral, r/o malignancy

## 2017-06-22 IMAGING — CR DG LUMBAR SPINE COMPLETE 4+V
5 series · 5 of 5 positions shown · non-contrast
Comparison: None; correlation MRI thoracic spine 02/16/2012

CLINICAL DATA: Struck in lower back by a falling object when
bending over to pick up a television, BILATERAL low back pain, prior
lumbar surgery

EXAM:
LUMBAR SPINE - COMPLETE 4+ VIEW

[t l-spine a.p. (1 of 2)]
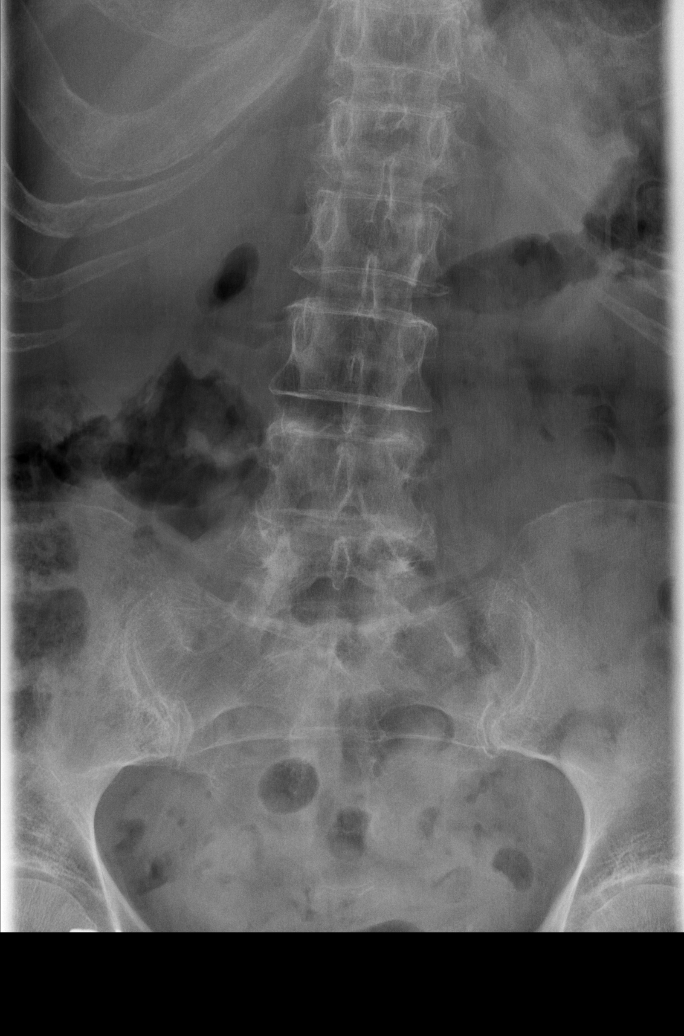

[t l-spine lat (1 of 3)]
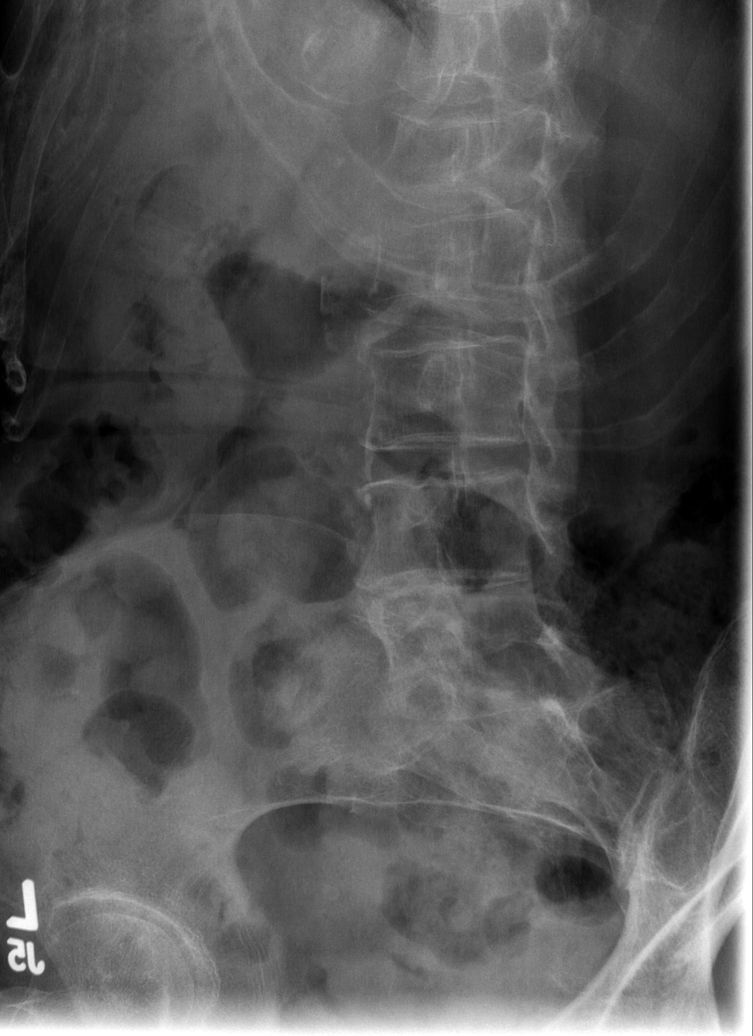

[t l-spine lat (2 of 3)]
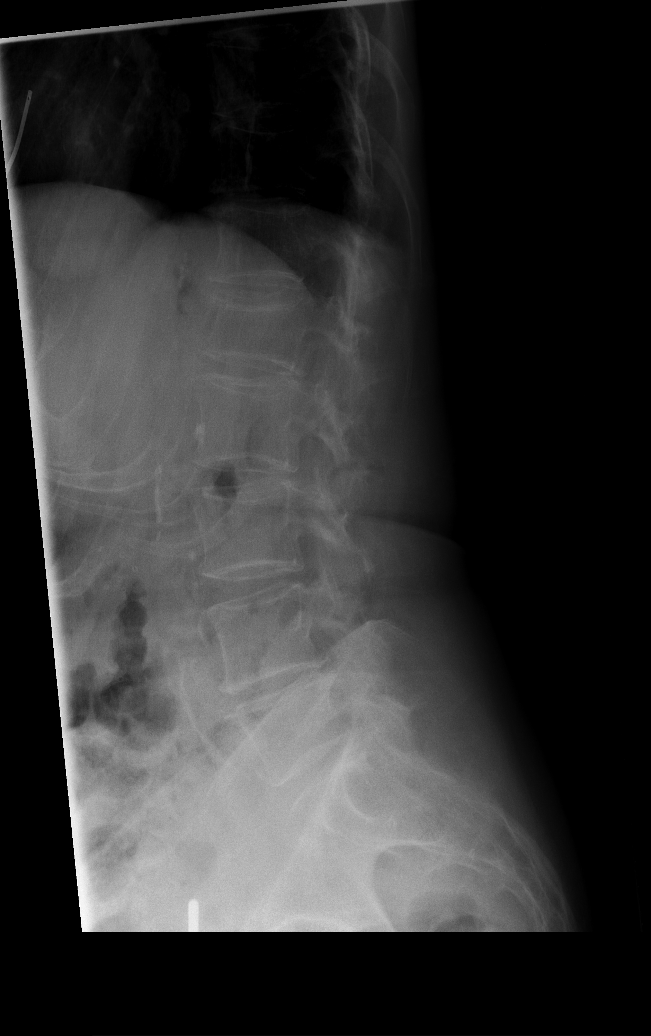

[t l-spine lat (3 of 3)]
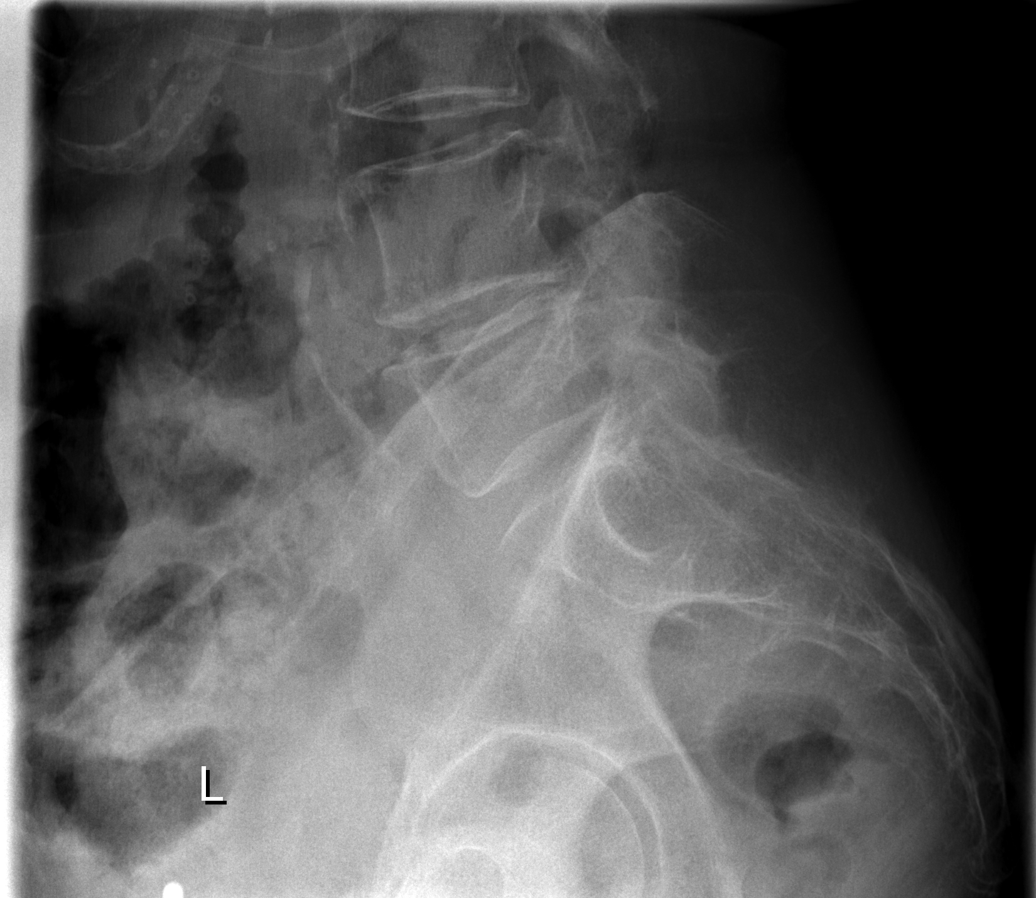

[t l-spine a.p. (2 of 2)]
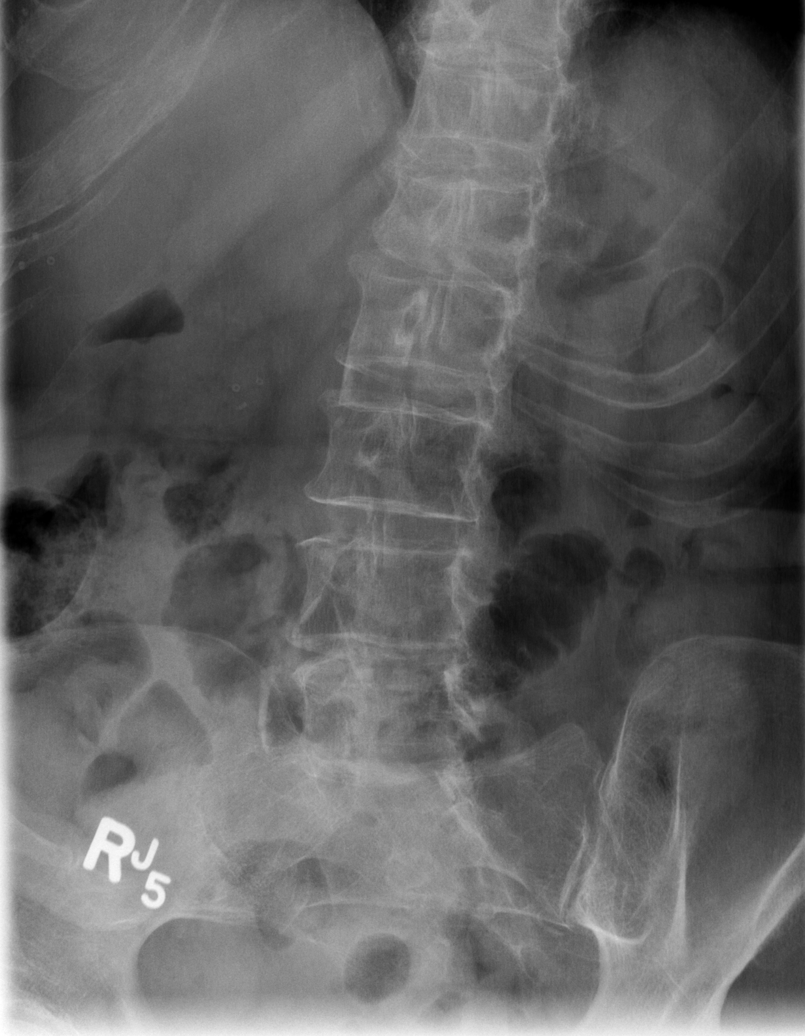

[5 of 5 positions shown; findings below may reference images not displayed]

FINDINGS: Five non-rib-bearing lumbar vertebra.

Diffuse osseous demineralization.

Disc space narrowing L4-L5.

Superior endplate compression fracture of L1 with approximately 25%
anterior height loss, age indeterminate ; however, this was not seen
on MR of 02/16/2012

Remaining vertebral body heights maintained.

No additional fracture or subluxation.

No spondylolysis.

Mid SI joints symmetric.
IMPRESSION: Osseous demineralization.

Superior endplate compression fracture of L1 with approximately 25%
anterior height loss, new since [DATE].

## 2017-07-07 IMAGING — RF DG C-ARM 61-120 MIN
1 series · 1 of 1 positions shown · non-contrast
Comparison: October 17, 2014.

CLINICAL DATA: L1 kyphoplasty.

EXAM:
DG C-ARM 61-120 MIN; LUMBAR SPINE - 2-3 VIEW
FLUOROSCOPY TIME:  1 minutes 35 seconds.

[Series 1: run · 1 of 1 slices shown]
[im 1/1]
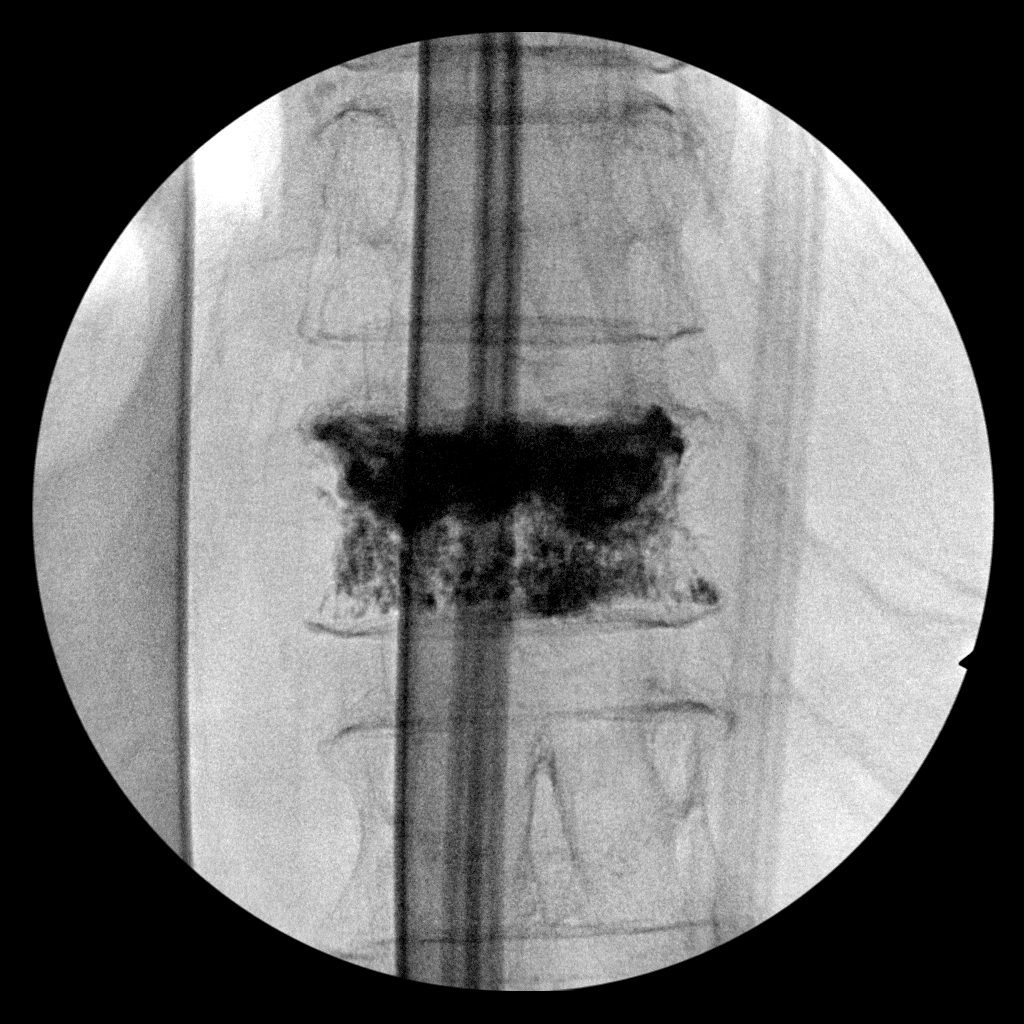

[1 of 1 positions shown; findings below may reference images not displayed]

FINDINGS: Two intraoperative fluoroscopic images of the upper lumbar spine
demonstrate the patient be status post kyphoplasty of L1 vertebral
body.
IMPRESSION: Status post L1 kyphoplasty.

## 2017-08-09 ENCOUNTER — Telehealth: Payer: Self-pay | Admitting: Hematology

## 2017-08-09 NOTE — Telephone Encounter (Signed)
Spoke to patient regarding voicemail she left earlier. Patient requested labs a week prior to therapeutic phle. [place] per nurse.

## 2017-09-29 ENCOUNTER — Inpatient Hospital Stay: Payer: Medicare Other | Attending: Hematology

## 2017-09-29 LAB — CBC WITH DIFFERENTIAL/PLATELET
Basophils Absolute: 0 10*3/uL (ref 0.0–0.1)
Basophils Relative: 0 %
Eosinophils Absolute: 0.2 10*3/uL (ref 0.0–0.5)
Eosinophils Relative: 3 %
HEMATOCRIT: 40.4 % (ref 34.8–46.6)
Hemoglobin: 14.1 g/dL (ref 11.6–15.9)
LYMPHS PCT: 18 %
Lymphs Abs: 1.3 10*3/uL (ref 0.9–3.3)
MCH: 32.8 pg (ref 25.1–34.0)
MCHC: 34.9 g/dL (ref 31.5–36.0)
MCV: 94 fL (ref 79.5–101.0)
MONOS PCT: 9 %
Monocytes Absolute: 0.6 10*3/uL (ref 0.1–0.9)
NEUTROS ABS: 5 10*3/uL (ref 1.5–6.5)
Neutrophils Relative %: 70 %
Platelets: 214 10*3/uL (ref 145–400)
RBC: 4.3 MIL/uL (ref 3.70–5.45)
RDW: 12.7 % (ref 11.2–14.5)
WBC: 7.2 10*3/uL (ref 3.9–10.3)

## 2017-09-29 LAB — RETICULOCYTES
RBC.: 4.3 MIL/uL (ref 3.70–5.45)
RETIC COUNT ABSOLUTE: 73.1 10*3/uL (ref 33.7–90.7)
Retic Ct Pct: 1.7 % (ref 0.7–2.1)

## 2017-09-29 LAB — FERRITIN: Ferritin: 84 ng/mL (ref 9–269)

## 2017-10-04 DIAGNOSIS — D485 Neoplasm of uncertain behavior of skin: Secondary | ICD-10-CM | POA: Diagnosis not present

## 2017-10-04 DIAGNOSIS — L821 Other seborrheic keratosis: Secondary | ICD-10-CM | POA: Diagnosis not present

## 2017-10-04 DIAGNOSIS — Z85828 Personal history of other malignant neoplasm of skin: Secondary | ICD-10-CM | POA: Diagnosis not present

## 2017-10-04 DIAGNOSIS — L570 Actinic keratosis: Secondary | ICD-10-CM | POA: Diagnosis not present

## 2017-10-04 DIAGNOSIS — D044 Carcinoma in situ of skin of scalp and neck: Secondary | ICD-10-CM | POA: Diagnosis not present

## 2017-10-04 DIAGNOSIS — D045 Carcinoma in situ of skin of trunk: Secondary | ICD-10-CM | POA: Diagnosis not present

## 2017-10-05 ENCOUNTER — Telehealth: Payer: Self-pay

## 2017-10-05 NOTE — Telephone Encounter (Signed)
1100-Brandy Peterson called to ask about her lab results from 09/29/17.  For some reason, her labs did not release to mychart either and they normally do.  She was trying to figure out if she should come for her phlebotomy tomorrow and I advised her that she should as her Ferritin is 84.  She will be here for her 1200 infusion visit tomorrow.  Gardiner Rhyme

## 2017-10-06 ENCOUNTER — Inpatient Hospital Stay: Payer: Medicare Other

## 2017-10-06 ENCOUNTER — Other Ambulatory Visit: Payer: Medicare Other

## 2017-10-06 NOTE — Patient Instructions (Signed)

## 2017-10-11 DIAGNOSIS — D045 Carcinoma in situ of skin of trunk: Secondary | ICD-10-CM | POA: Diagnosis not present

## 2017-11-02 DIAGNOSIS — M47812 Spondylosis without myelopathy or radiculopathy, cervical region: Secondary | ICD-10-CM | POA: Diagnosis not present

## 2017-11-02 DIAGNOSIS — S32010D Wedge compression fracture of first lumbar vertebra, subsequent encounter for fracture with routine healing: Secondary | ICD-10-CM | POA: Diagnosis not present

## 2017-11-02 DIAGNOSIS — I10 Essential (primary) hypertension: Secondary | ICD-10-CM | POA: Diagnosis not present

## 2017-11-02 DIAGNOSIS — M5030 Other cervical disc degeneration, unspecified cervical region: Secondary | ICD-10-CM | POA: Diagnosis not present

## 2017-11-02 DIAGNOSIS — M542 Cervicalgia: Secondary | ICD-10-CM | POA: Diagnosis not present

## 2017-11-02 DIAGNOSIS — M5414 Radiculopathy, thoracic region: Secondary | ICD-10-CM | POA: Diagnosis not present

## 2017-11-09 DIAGNOSIS — M5124 Other intervertebral disc displacement, thoracic region: Secondary | ICD-10-CM | POA: Diagnosis not present

## 2017-11-09 DIAGNOSIS — M5414 Radiculopathy, thoracic region: Secondary | ICD-10-CM | POA: Diagnosis not present

## 2017-11-09 DIAGNOSIS — M47812 Spondylosis without myelopathy or radiculopathy, cervical region: Secondary | ICD-10-CM | POA: Diagnosis not present

## 2017-11-15 DIAGNOSIS — M47812 Spondylosis without myelopathy or radiculopathy, cervical region: Secondary | ICD-10-CM | POA: Diagnosis not present

## 2017-11-15 DIAGNOSIS — M5030 Other cervical disc degeneration, unspecified cervical region: Secondary | ICD-10-CM | POA: Diagnosis not present

## 2017-11-15 DIAGNOSIS — I10 Essential (primary) hypertension: Secondary | ICD-10-CM | POA: Diagnosis not present

## 2017-11-15 DIAGNOSIS — M5414 Radiculopathy, thoracic region: Secondary | ICD-10-CM | POA: Diagnosis not present

## 2017-11-30 DIAGNOSIS — S46812A Strain of other muscles, fascia and tendons at shoulder and upper arm level, left arm, initial encounter: Secondary | ICD-10-CM | POA: Diagnosis not present

## 2017-12-09 DIAGNOSIS — M6281 Muscle weakness (generalized): Secondary | ICD-10-CM | POA: Diagnosis not present

## 2017-12-09 DIAGNOSIS — M546 Pain in thoracic spine: Secondary | ICD-10-CM | POA: Diagnosis not present

## 2017-12-09 DIAGNOSIS — M545 Low back pain: Secondary | ICD-10-CM | POA: Diagnosis not present

## 2017-12-09 DIAGNOSIS — R293 Abnormal posture: Secondary | ICD-10-CM | POA: Diagnosis not present

## 2017-12-14 DIAGNOSIS — M546 Pain in thoracic spine: Secondary | ICD-10-CM | POA: Diagnosis not present

## 2017-12-14 DIAGNOSIS — R293 Abnormal posture: Secondary | ICD-10-CM | POA: Diagnosis not present

## 2017-12-14 DIAGNOSIS — M6281 Muscle weakness (generalized): Secondary | ICD-10-CM | POA: Diagnosis not present

## 2017-12-14 DIAGNOSIS — M545 Low back pain: Secondary | ICD-10-CM | POA: Diagnosis not present

## 2017-12-16 DIAGNOSIS — M545 Low back pain: Secondary | ICD-10-CM | POA: Diagnosis not present

## 2017-12-16 DIAGNOSIS — M6281 Muscle weakness (generalized): Secondary | ICD-10-CM | POA: Diagnosis not present

## 2017-12-16 DIAGNOSIS — M546 Pain in thoracic spine: Secondary | ICD-10-CM | POA: Diagnosis not present

## 2017-12-16 DIAGNOSIS — R293 Abnormal posture: Secondary | ICD-10-CM | POA: Diagnosis not present

## 2017-12-17 ENCOUNTER — Ambulatory Visit: Payer: Medicare Other | Admitting: Internal Medicine

## 2017-12-21 DIAGNOSIS — R293 Abnormal posture: Secondary | ICD-10-CM | POA: Diagnosis not present

## 2017-12-21 DIAGNOSIS — M545 Low back pain: Secondary | ICD-10-CM | POA: Diagnosis not present

## 2017-12-21 DIAGNOSIS — M546 Pain in thoracic spine: Secondary | ICD-10-CM | POA: Diagnosis not present

## 2017-12-21 DIAGNOSIS — M6281 Muscle weakness (generalized): Secondary | ICD-10-CM | POA: Diagnosis not present

## 2017-12-23 DIAGNOSIS — M545 Low back pain: Secondary | ICD-10-CM | POA: Diagnosis not present

## 2017-12-23 DIAGNOSIS — R293 Abnormal posture: Secondary | ICD-10-CM | POA: Diagnosis not present

## 2017-12-23 DIAGNOSIS — M546 Pain in thoracic spine: Secondary | ICD-10-CM | POA: Diagnosis not present

## 2017-12-23 DIAGNOSIS — M6281 Muscle weakness (generalized): Secondary | ICD-10-CM | POA: Diagnosis not present

## 2017-12-28 DIAGNOSIS — R293 Abnormal posture: Secondary | ICD-10-CM | POA: Diagnosis not present

## 2017-12-28 DIAGNOSIS — M545 Low back pain: Secondary | ICD-10-CM | POA: Diagnosis not present

## 2017-12-28 DIAGNOSIS — M546 Pain in thoracic spine: Secondary | ICD-10-CM | POA: Diagnosis not present

## 2017-12-28 DIAGNOSIS — M6281 Muscle weakness (generalized): Secondary | ICD-10-CM | POA: Diagnosis not present

## 2017-12-30 DIAGNOSIS — M545 Low back pain: Secondary | ICD-10-CM | POA: Diagnosis not present

## 2017-12-30 DIAGNOSIS — M546 Pain in thoracic spine: Secondary | ICD-10-CM | POA: Diagnosis not present

## 2017-12-30 DIAGNOSIS — R293 Abnormal posture: Secondary | ICD-10-CM | POA: Diagnosis not present

## 2017-12-30 DIAGNOSIS — M6281 Muscle weakness (generalized): Secondary | ICD-10-CM | POA: Diagnosis not present

## 2018-01-04 DIAGNOSIS — M546 Pain in thoracic spine: Secondary | ICD-10-CM | POA: Diagnosis not present

## 2018-01-04 DIAGNOSIS — R293 Abnormal posture: Secondary | ICD-10-CM | POA: Diagnosis not present

## 2018-01-04 DIAGNOSIS — M545 Low back pain: Secondary | ICD-10-CM | POA: Diagnosis not present

## 2018-01-04 DIAGNOSIS — M6281 Muscle weakness (generalized): Secondary | ICD-10-CM | POA: Diagnosis not present

## 2018-01-07 ENCOUNTER — Ambulatory Visit (INDEPENDENT_AMBULATORY_CARE_PROVIDER_SITE_OTHER): Payer: Medicare Other

## 2018-01-07 DIAGNOSIS — Z23 Encounter for immunization: Secondary | ICD-10-CM | POA: Diagnosis not present

## 2018-03-03 DIAGNOSIS — M47814 Spondylosis without myelopathy or radiculopathy, thoracic region: Secondary | ICD-10-CM | POA: Diagnosis not present

## 2018-03-03 DIAGNOSIS — M5414 Radiculopathy, thoracic region: Secondary | ICD-10-CM | POA: Diagnosis not present

## 2018-03-03 DIAGNOSIS — M5134 Other intervertebral disc degeneration, thoracic region: Secondary | ICD-10-CM | POA: Diagnosis not present

## 2018-03-03 DIAGNOSIS — M5030 Other cervical disc degeneration, unspecified cervical region: Secondary | ICD-10-CM | POA: Diagnosis not present

## 2018-03-28 ENCOUNTER — Telehealth: Payer: Self-pay | Admitting: *Deleted

## 2018-03-28 NOTE — Telephone Encounter (Signed)
Patient states she has Lab/MD appt on 12/18, with appt added for phlebotomy on 12/20. Patient cannot come to 12/20 appt. Requests reschedule for 12/27. Advised her that scheduling message will be sent with request to change appt to 12/27.

## 2018-03-29 NOTE — Progress Notes (Signed)
[facility]    HEMATOLOGY/ONCOLOGY CLINIC NOTE  Date of Service: .09/25/2016  Patient Care Team: Biagio Borg, MD as PCP - General (Internal Medicine) Gordy Levan, MD (Hematology and Oncology) Druscilla Brownie, MD (Dermatology) Laurence Spates, MD (Gastroenterology)  CHIEF COMPLAINTS/PURPOSE OF CONSULTATION:  F/u for Hemochromatosis  HISTORY OF PRESENTING ILLNESS:   Brandy Peterson is a wonderful 84 y.o. female who has been referred to [place] by Dr Marko Plume for ongoing management of hemochromatosis. She notes that she has a family history of hemochromatosis and was diagnosed in the 27s. She has required maintenance therapy to phlebotomies roughly every 6 months to try to keep her ferritin levels below 50. She notes no known history of liver disease or heart disease. She reports that her ferritin levels even on diagnosis but not more than 1000. She does not recollect what her genetic mutation studies showed. Patient today has no acute new concerns. No new skin pigmentation changes. No new fatigue. No abdominal pain or distention. No shortness of breath or DOE.  INTERVAL HISTORY:  Brandy Peterson is here for fu of her hemochromatosis. The patient's last visit with [place] was on 03/30/17. The pt reports that she is doing well overall.   The pt reports that she did not have any problems with her last phlebotomy. She notes that in the past year she has had some more back pains, which she has discussed with her other physicians.   The pt notes that she has been told by her wound doctor that her feet have "bleeding veins," and notes that her feet appear "dirty."  Lab results today (03/30/18) of CBC w/diff, Reticulocytes, and CMP is as follows: all values are WNL. 03/30/18 Ferritin is at 54  On review of systems, pt reports stable energy levels, back pain, and denies abdominal pains, light headedness, dizziness, and any other symptoms.  MEDICAL HISTORY:  Past Medical History:  Diagnosis Date  .  Backache, unspecified   . Carcinoma of breast ([place])    Right  . Complication of anesthesia    was told to never use Sodium Pentothal - mother had reaction. Also had trouble with intubation during mastectomy  . COPD (chronic obstructive pulmonary disease) ([place])   . Family history of adverse reaction to anesthesia   . GERD (gastroesophageal reflux disease)   . Hemochromatosis   . History of shingles   . Hypertension   . Hypothyroidism   . IBS (irritable bowel syndrome)   . Plantar fasciitis   . Pneumonia   . Raynaud's syndrome   . Spastic colon    hx of   . Thyroid disease   . Varicose veins     SURGICAL HISTORY: Past Surgical History:  Procedure Laterality Date  . ABDOMINAL HYSTERECTOMY  1979   PARTIAL  . APPENDECTOMY    . BONE SPUR  1970   BOTH FEET  . CATARACT EXTRACTION  10/2012 and 12/2012   bilateral with lens implants  . COLONOSCOPY    . KYPHOPLASTY N/A 10/19/2014   Procedure: Lumbar 1 kyphoplasty;  Surgeon: Jovita Gamma, MD;  Location: [facility];  Service: Neurosurgery;  Laterality: N/A;  . LUMBAR LAMINECTOMY  1991   L5-S1  . MASTECTOMY  1994   RIGHT  . MASTECTOMY  1994   Right    SOCIAL HISTORY: Social History   Socioeconomic History  . Marital status: Widowed    Spouse name: Not on file  . Number of children: Not on file  . Years of education:  Not on file  . Highest education level: Not on file  Occupational History  . Not on file  Social Needs  . Financial resource strain: Not hard at all  . Food insecurity:    Worry: Never true    Inability: Never true  . Transportation needs:    Medical: No    Non-medical: No  Tobacco Use  . Smoking status: Former Smoker    Types: Cigarettes    Last attempt to quit: 04/13/1988    Years since quitting: 29.9  . Smokeless tobacco: Never Used  . Tobacco comment: approximate quit date  Substance and Sexual Activity  . Alcohol use: Yes    Alcohol/week: 3.0 standard drinks    Types: 3 Glasses of wine per  week    Comment: 2 OZ, 5 TIMES A WEEK  . Drug use: No  . Sexual activity: Never  Lifestyle  . Physical activity:    Days per week: 0 days    Minutes per session: 0 min  . Stress: Only a little  Relationships  . Social connections:    Talks on phone: More than three times a week    Gets together: More than three times a week    Attends religious service: More than 4 times per year    Active member of club or organization: Not on file    Attends meetings of clubs or organizations: Not on file    Relationship status: Widowed  . Intimate partner violence:    Fear of current or ex partner: Not on file    Emotionally abused: Not on file    Physically abused: Not on file    Forced sexual activity: Not on file  Other Topics Concern  . 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 She now lives alone I-ADLs. Pt has 1 son born 64 and 2 grandchildren.       Pt had a colonoscopy on 05/09/2012 by Dr Oletta Lamas.       Pt reports having a pneumo vaccine.    FAMILY HISTORY: Family History  Problem Relation Age of Onset  . Cancer Mother        metastatic cancer (pancreas vs lung)  . Hypertension Mother   . Osteoarthritis Mother   . Hypothyroidism Mother   . Varicose Veins Mother   . Bleeding Disorder Mother   . Heart disease Father        CAD/MI  . Hypertension Father   . Arthritis Father   . Hemochromatosis Father        Possible  . Deep vein thrombosis Father   . Varicose Veins Father   . Heart attack Father   . Heart attack Brother   . Hemochromatosis Brother   . Heart disease Brother   . Hyperlipidemia Son   . Diabetes Neg Hx     ALLERGIES:  is allergic to contrast media [iodinated diagnostic agents]; adhesive [tape]; and sulfonamide derivatives.  MEDICATIONS:  Current Outpatient Medications  Medication Sig Dispense Refill  . fluticasone (FLONASE) 50 MCG/ACT nasal spray  Place 1 spray into both nostrils daily. 48 g 3  . levothyroxine (SYNTHROID, LEVOTHROID) 100 MCG tablet Take 1 tablet (100 mcg total) by mouth daily. 90 tablet 3  . metoprolol succinate (TOPROL-XL) 25 MG 24 hr tablet Take 1 tablet (25 mg total) by mouth daily. 90 tablet 3  . omeprazole (PRILOSEC) 20 MG capsule Take  1 capsule (20 mg total) by mouth daily as needed (heartburn). 90 capsule 3  . triamterene-hydrochlorothiazide (MAXZIDE-25) 37.5-25 MG tablet Take 1 tablet by mouth daily. 90 tablet 3   No current facility-administered medications for this visit.     REVIEW OF SYSTEMS:    A 10+ POINT REVIEW OF SYSTEMS WAS OBTAINED including neurology, dermatology, psychiatry, cardiac, respiratory, lymph, extremities, GI, GU, Musculoskeletal, constitutional, breasts, reproductive, HEENT.  All pertinent positives are noted in the HPI.  All others are negative.   PHYSICAL EXAMINATION: ECOG PERFORMANCE STATUS: 1 - Symptomatic but completely ambulatory . Vitals:   03/30/18 1002  BP: (!) 183/56  Pulse: 64  Resp: 18  Temp: 97.7 F (36.5 C)  SpO2: 98%   Filed Weights   03/30/18 1002  Weight: 107 lb 11.2 oz (48.9 kg)   .Body mass index is 20.35 kg/m.  GENERAL:alert, in no acute distress and comfortable SKIN: no acute rashes, no significant lesions EYES: conjunctiva are pink and non-injected, sclera anicteric OROPHARYNX: MMM, no exudates, no oropharyngeal erythema or ulceration NECK: supple, no JVD LYMPH:  no palpable lymphadenopathy in the cervical, axillary or inguinal regions LUNGS: clear to auscultation b/l with normal respiratory effort HEART: regular rate & rhythm ABDOMEN:  normoactive bowel sounds , non tender, not distended. No palpable hepatosplenomegaly.  Extremity: no pedal edema PSYCH: alert & oriented x 3 with fluent speech NEURO: no focal motor/sensory deficits   LABORATORY DATA:  I have reviewed the data as listed  . CBC Latest Ref Rng & Units 03/30/2018 09/29/2017  06/16/2017  WBC 4.0 - 10.5 K/uL 5.8 7.2 8.2  Hemoglobin 12.0 - 15.0 g/dL 14.3 14.1 15.8(H)  Hematocrit 36.0 - 46.0 % 41.0 40.4 45.1  Platelets 150 - 400 K/uL 261 214 281.0    . CMP Latest Ref Rng & Units 03/30/2018 06/16/2017 03/23/2017  Glucose 70 - 99 mg/dL 80 88 83  BUN 8 - 23 mg/dL 17 16 11.2  Creatinine 0.44 - 1.00 mg/dL 0.79 0.69 0.8  Sodium 135 - 145 mmol/L 137 135 139  Potassium 3.5 - 5.1 mmol/L 4.3 3.9 3.4(L)  Chloride 98 - 111 mmol/L 102 98 -  CO2 22 - 32 mmol/L 27 32 27  Calcium 8.9 - 10.3 mg/dL 9.0 9.8 9.1  Total Protein 6.5 - 8.1 g/dL 6.5 7.4 6.8  Total Bilirubin 0.3 - 1.2 mg/dL 0.6 0.7 0.67  Alkaline Phos 38 - 126 U/L 91 89 86  AST 15 - 41 U/L 23 21 23   ALT 0 - 44 U/L 19 15 17    . Lab Results  Component Value Date   IRON 149 (H) 03/23/2017   TIBC 255 03/23/2017   IRONPCTSAT 58 (H) 03/23/2017   (Iron and TIBC)  Lab Results  Component Value Date   FERRITIN 54 03/30/2018    Hemochromatosis DNA-PCR(c282y,h63d)  Order: [id number]  Status:  Final result Visible to patient:  Yes (MyChart) Next appt:  10/06/2017 at 10:45 AM in Oncology [facility] 1) Dx:  Hereditary hemochromatosis ([place])  Component 3wk ago  Hemochromatosis Gene Comment   Comment: Result: AFFECTED  Two copies of the same mutation (C282Y and C282Y) identified         RADIOGRAPHIC STUDIES: I have personally reviewed the radiological images as listed and agreed with the findings in the report. No results found.  ASSESSMENT & PLAN:   84 y.o. female with  #1 history of hemochromatosis . Homozygous C282Y mutation  Patient notes that she has never had a ferritin level more than  1000. Family history positive. Her goal has been to keep her ferritin levels below 50. She has roughly needed a therapeutic phlebotomy every 6 months or so.  PLAN:  -Discussed pt labwork today, 03/30/18; all blood counts and chemistries are normal including HGB at 14.3 -03/30/18 Ferritin is at 54, just above goal  which is 50. Pt prefers to move forward with phlebotomy scheduled for 04/08/18 -Will tentatively set up next phlebotomy in 6 months, with labs one week prior  - The patient was counseled to pursue good oral hydration . -Will see the pt back in 12 months with tentative therapeutic phlebotomy    #2 T1N0 node negative right breast cancer 1994, post mastectomy with nodes, 5 years of adjuvant tamoxifen. No clinical evidence of breast cancer recurrence at this time . Plan  - continue yearly mammograms with primary care physician  -She has required 3D tomo mammography due to extremely dense breast tissue.   F/u for therapeutic phlebotomy as scheduled on 12/27 Please schedule labs in 6 months and phlebotomy 1 week after labs PLease schedule labs and MD visit in 12 months and therapeutic phlebotomy 1 week after that appointment     All of the patients questions were answered with apparent satisfaction. The patient knows to call the clinic with any problems, questions or concerns.  The total time spent in the appt was 20 minutes and more than 50% was on counseling and direct patient cares.    Sullivan Lone MD [place] AAHIVMS [facility] Hematology/Oncology Physician [facility]  (Office):       [phone] (Work cell):  [phone] (Fax):           [phone]   I, Baldwin [place], am acting as a scribe for Dr. Sullivan Lone.   .I have reviewed the above documentation for accuracy and completeness, and I agree with the above. Brunetta Genera MD

## 2018-03-30 ENCOUNTER — Inpatient Hospital Stay: Payer: Medicare Other

## 2018-03-30 ENCOUNTER — Telehealth: Payer: Self-pay | Admitting: Hematology

## 2018-03-30 ENCOUNTER — Inpatient Hospital Stay: Payer: Medicare Other | Attending: Hematology | Admitting: Hematology

## 2018-03-30 DIAGNOSIS — I10 Essential (primary) hypertension: Secondary | ICD-10-CM

## 2018-03-30 DIAGNOSIS — Z87891 Personal history of nicotine dependence: Secondary | ICD-10-CM

## 2018-03-30 DIAGNOSIS — Z853 Personal history of malignant neoplasm of breast: Secondary | ICD-10-CM | POA: Diagnosis not present

## 2018-03-30 DIAGNOSIS — E039 Hypothyroidism, unspecified: Secondary | ICD-10-CM | POA: Diagnosis not present

## 2018-03-30 DIAGNOSIS — Z9011 Acquired absence of right breast and nipple: Secondary | ICD-10-CM | POA: Diagnosis not present

## 2018-03-30 DIAGNOSIS — Z79899 Other long term (current) drug therapy: Secondary | ICD-10-CM

## 2018-03-30 LAB — RETICULOCYTES
Immature Retic Fract: 5.7 % (ref 2.3–15.9)
RBC.: 4.33 MIL/uL (ref 3.87–5.11)
Retic Count, Absolute: 86.6 10*3/uL (ref 19.0–186.0)
Retic Ct Pct: 2 % (ref 0.4–3.1)

## 2018-03-30 LAB — CBC WITH DIFFERENTIAL (CANCER CENTER ONLY)
Abs Immature Granulocytes: 0.02 10*3/uL (ref 0.00–0.07)
Basophils Absolute: 0 10*3/uL (ref 0.0–0.1)
Basophils Relative: 0 %
Eosinophils Absolute: 0.3 10*3/uL (ref 0.0–0.5)
Eosinophils Relative: 5 %
HEMATOCRIT: 41 % (ref 36.0–46.0)
Hemoglobin: 14.3 g/dL (ref 12.0–15.0)
Immature Granulocytes: 0 %
LYMPHS ABS: 1.2 10*3/uL (ref 0.7–4.0)
Lymphocytes Relative: 20 %
MCH: 33 pg (ref 26.0–34.0)
MCHC: 34.9 g/dL (ref 30.0–36.0)
MCV: 94.7 fL (ref 80.0–100.0)
Monocytes Absolute: 0.7 10*3/uL (ref 0.1–1.0)
Monocytes Relative: 11 %
Neutro Abs: 3.7 10*3/uL (ref 1.7–7.7)
Neutrophils Relative %: 64 %
Platelet Count: 261 10*3/uL (ref 150–400)
RBC: 4.33 MIL/uL (ref 3.87–5.11)
RDW: 11.8 % (ref 11.5–15.5)
WBC Count: 5.8 10*3/uL (ref 4.0–10.5)
nRBC: 0 % (ref 0.0–0.2)

## 2018-03-30 LAB — COMPREHENSIVE METABOLIC PANEL
ALT: 19 U/L (ref 0–44)
AST: 23 U/L (ref 15–41)
Albumin: 3.5 g/dL (ref 3.5–5.0)
Alkaline Phosphatase: 91 U/L (ref 38–126)
Anion gap: 8 (ref 5–15)
BUN: 17 mg/dL (ref 8–23)
CHLORIDE: 102 mmol/L (ref 98–111)
CO2: 27 mmol/L (ref 22–32)
CREATININE: 0.79 mg/dL (ref 0.44–1.00)
Calcium: 9 mg/dL (ref 8.9–10.3)
GFR calc Af Amer: >60 mL/min (ref >60)
Glucose, Bld: 80 mg/dL (ref 70–99)
POTASSIUM: 4.3 mmol/L (ref 3.5–5.1)
SODIUM: 137 mmol/L (ref 135–145)
Total Bilirubin: 0.6 mg/dL (ref 0.3–1.2)
Total Protein: 6.5 g/dL (ref 6.5–8.1)

## 2018-03-30 LAB — FERRITIN: Ferritin: 54 ng/mL (ref 11–307)

## 2018-03-30 NOTE — Telephone Encounter (Signed)
Printed calendar and avs. °

## 2018-03-30 NOTE — Patient Instructions (Signed)
Thank you for choosing [facility] to provide your oncology and hematology care.  To afford each patient quality time with our providers, please arrive 30 minutes before your scheduled appointment time.  If you arrive late for your appointment, you may be asked to reschedule.  We strive to give you quality time with our providers, and arriving late affects you and other patients whose appointments are after yours.    If you are a no show for multiple scheduled visits, you may be dismissed from the clinic at the providers discretion.     Again, thank you for choosing [facility], our hope is that these requests will decrease the amount of time that you wait before being seen by our physicians.  ______________________________________________________________________   Should you have questions after your visit to the [facility], please contact our office at [phone] between the hours of 8:30 and 4:30 p.m.    Voicemails left after 4:30p.m will not be returned until the following business day.     For prescription refill requests, please have your pharmacy contact us directly.  Please also try to allow 48 hours for prescription requests.     Please contact the scheduling department for questions regarding scheduling.  For scheduling of procedures such as PET scans, CT scans, MRI, Ultrasound, etc please contact central scheduling at [phone].     Resources For Cancer Patients and Caregivers:    Oncolink.org:  A wonderful resource for patients and healthcare providers for information regarding your disease, ways to tract your treatment, what to expect, etc.      American Cancer Society:  800-227-2345  Can help patients locate various types of support and financial assistance   Cancer Care: 1-800-813-HOPE (4673) Provides financial assistance, online support groups, medication/co-pay assistance.     [place] DSS:  336-641-3447 Where to apply  for food stamps, Medicaid, and utility assistance   Medicare Rights Center: 800-333-4114 Helps people with Medicare understand their rights and benefits, navigate the Medicare system, and secure the quality healthcare they deserve   SCAT: 336-333-6589 [employer]'s shared-ride transportation service for eligible riders who have a disability that prevents them from riding the fixed route bus.     For additional information on assistance programs please contact our social worker:   Abigail Elmore:  [phone]  

## 2018-04-08 ENCOUNTER — Inpatient Hospital Stay: Payer: Medicare Other

## 2018-04-08 NOTE — Progress Notes (Signed)
Brandy Peterson presents today for phlebotomy per MD orders. 18G catheter used.  Phlebotomy procedure started at 1515 and ended at 1525. 550 grams removed. Patient observed for 30 minutes after procedure without any incident. Patient tolerated procedure well. IV needle removed intact.

## 2018-04-12 DIAGNOSIS — D1801 Hemangioma of skin and subcutaneous tissue: Secondary | ICD-10-CM | POA: Diagnosis not present

## 2018-04-12 DIAGNOSIS — L821 Other seborrheic keratosis: Secondary | ICD-10-CM | POA: Diagnosis not present

## 2018-04-12 DIAGNOSIS — L570 Actinic keratosis: Secondary | ICD-10-CM | POA: Diagnosis not present

## 2018-04-12 DIAGNOSIS — Z85828 Personal history of other malignant neoplasm of skin: Secondary | ICD-10-CM | POA: Diagnosis not present

## 2018-04-18 DIAGNOSIS — Z853 Personal history of malignant neoplasm of breast: Secondary | ICD-10-CM | POA: Diagnosis not present

## 2018-04-18 DIAGNOSIS — Z1231 Encounter for screening mammogram for malignant neoplasm of breast: Secondary | ICD-10-CM | POA: Diagnosis not present

## 2018-04-18 LAB — HM MAMMOGRAPHY

## 2018-04-25 ENCOUNTER — Other Ambulatory Visit: Payer: Self-pay | Admitting: Gastroenterology

## 2018-04-25 ENCOUNTER — Other Ambulatory Visit (HOSPITAL_COMMUNITY): Payer: Self-pay | Admitting: Gastroenterology

## 2018-04-25 DIAGNOSIS — R1310 Dysphagia, unspecified: Secondary | ICD-10-CM | POA: Diagnosis not present

## 2018-04-25 DIAGNOSIS — R198 Other specified symptoms and signs involving the digestive system and abdomen: Secondary | ICD-10-CM | POA: Diagnosis not present

## 2018-05-02 DIAGNOSIS — R194 Change in bowel habit: Secondary | ICD-10-CM | POA: Diagnosis not present

## 2018-05-04 ENCOUNTER — Encounter: Payer: Self-pay | Admitting: Internal Medicine

## 2018-05-19 DIAGNOSIS — C50919 Malignant neoplasm of unspecified site of unspecified female breast: Secondary | ICD-10-CM | POA: Diagnosis not present

## 2018-05-25 ENCOUNTER — Encounter: Payer: Self-pay | Admitting: Internal Medicine

## 2018-05-25 ENCOUNTER — Ambulatory Visit (INDEPENDENT_AMBULATORY_CARE_PROVIDER_SITE_OTHER): Payer: Medicare Other | Admitting: Internal Medicine

## 2018-05-25 ENCOUNTER — Other Ambulatory Visit (INDEPENDENT_AMBULATORY_CARE_PROVIDER_SITE_OTHER): Payer: Medicare Other

## 2018-05-25 VITALS — BP 110/70 | HR 76 | Temp 97.8°F | Ht 61.0 in | Wt 111.0 lb

## 2018-05-25 DIAGNOSIS — I7300 Raynaud's syndrome without gangrene: Secondary | ICD-10-CM

## 2018-05-25 DIAGNOSIS — I10 Essential (primary) hypertension: Secondary | ICD-10-CM | POA: Diagnosis not present

## 2018-05-25 DIAGNOSIS — E039 Hypothyroidism, unspecified: Secondary | ICD-10-CM

## 2018-05-25 LAB — T4, FREE: Free T4: 2.2 ng/dL — ABNORMAL HIGH (ref 0.60–1.60)

## 2018-05-25 LAB — TSH: TSH: 2.25 u[IU]/mL (ref 0.35–4.50)

## 2018-05-25 LAB — LIPID PANEL
CHOLESTEROL: 202 mg/dL — AB (ref 0–200)
HDL: 59.4 mg/dL (ref >39.00)
LDL CALC: 121 mg/dL — AB (ref 0–99)
NonHDL: 143.05
Total CHOL/HDL Ratio: 3
Triglycerides: 109 mg/dL (ref 0.0–149.0)
VLDL: 21.8 mg/dL (ref 0.0–40.0)

## 2018-05-25 MED ORDER — FLUTICASONE PROPIONATE 50 MCG/ACT NA SUSP: 1.0000 | Freq: Every day | NASAL | 3 refills | Status: DC

## 2018-05-25 MED ORDER — TRIAMTERENE-HCTZ 37.5-25 MG PO TABS: 1.0000 | ORAL_TABLET | Freq: Every day | ORAL | 3 refills | Status: DC

## 2018-05-25 MED ORDER — LEVOTHYROXINE SODIUM 100 MCG PO TABS: 100.0000 ug | ORAL_TABLET | Freq: Every day | ORAL | 3 refills | Status: DC

## 2018-05-25 MED ORDER — METOPROLOL SUCCINATE ER 25 MG PO TB24: 25.0000 mg | ORAL_TABLET | Freq: Every day | ORAL | 3 refills | Status: DC

## 2018-05-25 MED ORDER — AMLODIPINE BESYLATE 10 MG PO TABS: 10.0000 mg | ORAL_TABLET | Freq: Every day | ORAL | 3 refills | Status: DC

## 2018-05-25 NOTE — Assessment & Plan Note (Signed)
For tfts in followup

## 2018-05-25 NOTE — Progress Notes (Signed)
Subjective:    Patient ID: Brandy Peterson, female    DOB: Sep 22, 1930, 85 y.o.   MRN: [id number]  HPI  Here to f/u; overall doing ok,  Pt denies chest pain, increasing sob or doe, wheezing, orthopnea, PND, increased LE swelling, palpitations, dizziness or syncope.  Pt denies new neurological symptoms such as new headache, or facial or extremity weakness or numbness.  Pt denies polydipsia, polyuria, or low sugar episode.  Pt states overall good compliance with meds, mostly trying to follow appropriate diet, with wt overall stable,  but little exercise however.  Had phlebotomy recent with ferritin > 50 in dec 2019.  BP at home about 170/80 at home.  Also with raynauds to fingers more active recently, asking what tx possible.  Did have recent recen bronchitis like symptoms now with occasinoal non prod cough in the past wk only.   Past Medical History:  Diagnosis Date  . Backache, unspecified   . Carcinoma of breast ([place])    Right  . Complication of anesthesia    was told to never use Sodium Pentothal - mother had reaction. Also had trouble with intubation during mastectomy  . COPD (chronic obstructive pulmonary disease) ([place])   . Family history of adverse reaction to anesthesia   . GERD (gastroesophageal reflux disease)   . Hemochromatosis   . History of shingles   . Hypertension   . Hypothyroidism   . IBS (irritable bowel syndrome)   . Plantar fasciitis   . Pneumonia   . Raynaud's syndrome   . Spastic colon    hx of   . Thyroid disease   . Varicose veins    Past Surgical History:  Procedure Laterality Date  . ABDOMINAL HYSTERECTOMY  1979   PARTIAL  . APPENDECTOMY    . BONE SPUR  1970   BOTH FEET  . CATARACT EXTRACTION  10/2012 and 12/2012   bilateral with lens implants  . COLONOSCOPY    . KYPHOPLASTY N/A 10/19/2014   Procedure: Lumbar 1 kyphoplasty;  Surgeon: Jovita Gamma, MD;  Location: [facility];  Service: Neurosurgery;  Laterality: N/A;  . LUMBAR LAMINECTOMY  1991   L5-S1    . MASTECTOMY  1994   RIGHT  . MASTECTOMY  1994   Right    reports that she quit smoking about 30 years ago. Her smoking use included cigarettes. She has never used smokeless tobacco. She reports current alcohol use of about 3.0 standard drinks of alcohol per week. She reports that she does not use drugs. family history includes Arthritis in her father; Bleeding Disorder in her mother; Cancer in her mother; Deep vein thrombosis in her father; Heart attack in her brother and father; Heart disease in her brother and father; Hemochromatosis in her brother and father; Hyperlipidemia in her son; Hypertension in her father and mother; Hypothyroidism in her mother; Osteoarthritis in her mother; Varicose Veins in her father and mother. Allergies  Allergen Reactions  . Contrast Media [Iodinated Diagnostic Agents] Hives, Itching and Other (See Comments)    Felt like she was going to faint   . Adhesive [Tape] Other (See Comments)    Prefers paper tape  . Sulfonamide Derivatives Other (See Comments)    Reaction: unknown    Current Outpatient Medications on File Prior to Visit  Medication Sig Dispense Refill  . omeprazole (PRILOSEC) 20 MG capsule Take 1 capsule (20 mg total) by mouth daily as needed (heartburn). 90 capsule 3   No current facility-administered medications on  file prior to visit.    Review of Systems  Constitutional: Negative for other unusual diaphoresis or sweats HENT: Negative for ear discharge or swelling Eyes: Negative for other worsening visual disturbances Respiratory: Negative for stridor or other swelling  Gastrointestinal: Negative for worsening distension or other blood Genitourinary: Negative for retention or other urinary change Musculoskeletal: Negative for other MSK pain or swelling Skin: Negative for color change or other new lesions Neurological: Negative for worsening tremors and other numbness  Psychiatric/Behavioral: Negative for worsening agitation or other  fatigue All other system neg per pt    Objective:   Physical Exam BP 110/70   Pulse 76   Temp 97.8 F (36.6 C) (Oral)   Ht 5\' 1"  (1.549 m)   Wt 111 lb (50.3 kg)   SpO2 94%   BMI 20.97 kg/m  Repeat BP 172/80 VS noted,  Constitutional: Pt appears in NAD HENT: Head: NCAT.  Right Ear: External ear normal.  Left Ear: External ear normal.  Eyes: . Pupils are equal, round, and reactive to light. Conjunctivae and EOM are normal Nose: without d/c or deformity Neck: Neck supple. Gross normal ROM Cardiovascular: Normal rate and regular rhythm.   Pulmonary/Chest: Effort normal and breath sounds without rales or wheezing.  Abd:  Soft, NT, ND, + BS, no organomegaly Neurological: Pt is alert. At baseline orientation, motor grossly intact Skin: Skin is warm. No rashes, other new lesions, no LE edema Psychiatric: Pt behavior is normal without agitation  No other exam findings  Lab Results  Component Value Date   WBC 5.8 03/30/2018   HGB 14.3 03/30/2018   HCT 41.0 03/30/2018   PLT 261 03/30/2018   GLUCOSE 80 03/30/2018   CHOL 193 06/16/2017   TRIG 48.0 06/16/2017   HDL 85.80 06/16/2017   LDLDIRECT 93.6 12/26/2010   LDLCALC 98 06/16/2017   ALT 19 03/30/2018   AST 23 03/30/2018   NA 137 03/30/2018   K 4.3 03/30/2018   CL 102 03/30/2018   CREATININE 0.79 03/30/2018   BUN 17 03/30/2018   CO2 27 03/30/2018   TSH 0.46 06/16/2017       Assessment & Plan:

## 2018-05-25 NOTE — Assessment & Plan Note (Signed)
Hopefull to improve with amlodipine as well

## 2018-05-25 NOTE — Assessment & Plan Note (Signed)
Uncontrolled to add amlodipine 10qd

## 2018-05-25 NOTE — Patient Instructions (Signed)
Please take all new medication as prescribed - the amlodipine 10 mg per day (and be aware this can take up to 3 weeks for the full effect since it is mild)  Please continue all other medications as before, and refills have been done if requested.  Please have the pharmacy call with any other refills you may need.  Please continue your efforts at being more active, low cholesterol diet, and weight control.  You are otherwise up to date with prevention measures today.  Please keep your appointments with your specialists as you may have planned  Please go to the LAB in the Basement (turn left off the elevator) for the tests to be done today  You will be contacted by phone if any changes need to be made immediately.  Otherwise, you will receive a letter about your results with an explanation, but please check with MyChart first.  Please remember to sign up for MyChart if you have not done so, as this will be important to you in the future with finding out test results, communicating by private email, and scheduling acute appointments online when needed.

## 2018-05-26 ENCOUNTER — Other Ambulatory Visit: Payer: Self-pay | Admitting: Internal Medicine

## 2018-05-26 ENCOUNTER — Encounter: Payer: Self-pay | Admitting: Internal Medicine

## 2018-05-26 MED ORDER — LEVOTHYROXINE SODIUM 88 MCG PO TABS: 88.0000 ug | ORAL_TABLET | Freq: Every day | ORAL | 3 refills | Status: DC

## 2018-05-27 ENCOUNTER — Other Ambulatory Visit: Payer: Self-pay | Admitting: Internal Medicine

## 2018-05-27 MED ORDER — LEVOTHYROXINE SODIUM 88 MCG PO TABS: 88.0000 ug | ORAL_TABLET | Freq: Every day | ORAL | 3 refills | Status: DC

## 2018-05-27 NOTE — Telephone Encounter (Signed)
Sent to wrong pharmacy- resent to requested pharmacy

## 2018-05-27 NOTE — Telephone Encounter (Signed)
Copied from [place] [phone]. Topic: Quick Communication - See Telephone Encounter >> May 27, 2018  3:17 PM Blase Mess A wrote: CRM for notification. See Telephone encounter for: 05/27/18.  Patient is calling to see if the levothyroxine (SYNTHROID, LEVOTHROID) 88 MCG tablet [[id number]] can be sent to [facility], [place] [phone] (Phone) [phone] (Fax)  It was sent to the wrong pharmacy please

## 2018-06-06 ENCOUNTER — Encounter: Payer: Self-pay | Admitting: Hematology

## 2018-06-06 DIAGNOSIS — M25561 Pain in right knee: Secondary | ICD-10-CM | POA: Diagnosis not present

## 2018-06-07 ENCOUNTER — Telehealth: Payer: Self-pay | Admitting: Hematology

## 2018-06-07 NOTE — Telephone Encounter (Signed)
Patient wanted to reschedule her appts in June and Dec a month out apart per staff messgae.   Per MD request he stated to cancel all the patient appts since her lab has been good and that he will see her in Jan 2021.  Called patient twice, no answer, will try calling patient again about her new appt.  Print and mailed calendar.

## 2018-06-21 ENCOUNTER — Encounter: Payer: Self-pay | Admitting: Internal Medicine

## 2018-08-22 ENCOUNTER — Telehealth: Payer: Self-pay | Admitting: *Deleted

## 2018-08-22 NOTE — Telephone Encounter (Signed)
I received fax and advised pharmacy ok to switch manufacturer. Pt informed.    Medication: levothyroxine (SYNTHROID, LEVOTHROID) 88 MCG tablet / Pt stated that the pharmacy has sent multiple faxes regarding the manufacturer changing. They need to have an okay from Dr. Jenny Reichmann before they can fill levothyroxine. Pt will need refill by Wednesday as she is going out of town. Please advise.  Has the patient contacted their pharmacy? Yes.   (Agent: If no, request that the patient contact the pharmacy for the refill.) (Agent: If yes, when and what did the pharmacy advise?)  Preferred Pharmacy (with phone number or street name): [facility], [place] [phone] (Phone) [phone] (Fax)

## 2018-09-22 ENCOUNTER — Other Ambulatory Visit: Payer: Medicare Other

## 2018-10-17 ENCOUNTER — Encounter: Payer: Self-pay | Admitting: Internal Medicine

## 2018-10-17 DIAGNOSIS — L859 Epidermal thickening, unspecified: Secondary | ICD-10-CM | POA: Diagnosis not present

## 2018-10-17 DIAGNOSIS — L814 Other melanin hyperpigmentation: Secondary | ICD-10-CM | POA: Diagnosis not present

## 2018-10-17 DIAGNOSIS — L821 Other seborrheic keratosis: Secondary | ICD-10-CM | POA: Diagnosis not present

## 2018-10-17 DIAGNOSIS — D485 Neoplasm of uncertain behavior of skin: Secondary | ICD-10-CM | POA: Diagnosis not present

## 2018-10-17 DIAGNOSIS — L570 Actinic keratosis: Secondary | ICD-10-CM | POA: Diagnosis not present

## 2018-10-17 DIAGNOSIS — L820 Inflamed seborrheic keratosis: Secondary | ICD-10-CM | POA: Diagnosis not present

## 2018-12-14 DIAGNOSIS — Z23 Encounter for immunization: Secondary | ICD-10-CM | POA: Diagnosis not present

## 2018-12-22 DIAGNOSIS — M25561 Pain in right knee: Secondary | ICD-10-CM | POA: Diagnosis not present

## 2019-01-10 DIAGNOSIS — R197 Diarrhea, unspecified: Secondary | ICD-10-CM | POA: Diagnosis not present

## 2019-01-23 DIAGNOSIS — M25561 Pain in right knee: Secondary | ICD-10-CM | POA: Diagnosis not present

## 2019-01-23 DIAGNOSIS — M546 Pain in thoracic spine: Secondary | ICD-10-CM | POA: Diagnosis not present

## 2019-01-24 ENCOUNTER — Other Ambulatory Visit: Payer: Self-pay | Admitting: Gastroenterology

## 2019-01-24 DIAGNOSIS — R1032 Left lower quadrant pain: Secondary | ICD-10-CM | POA: Diagnosis not present

## 2019-01-24 DIAGNOSIS — R198 Other specified symptoms and signs involving the digestive system and abdomen: Secondary | ICD-10-CM | POA: Diagnosis not present

## 2019-01-24 DIAGNOSIS — Z853 Personal history of malignant neoplasm of breast: Secondary | ICD-10-CM | POA: Diagnosis not present

## 2019-01-25 ENCOUNTER — Telehealth: Payer: Self-pay

## 2019-01-25 NOTE — Telephone Encounter (Signed)
Phone call to patient to review instructions for 13 hr prep for CT w/ contrast on 01/31/2019  at 1pm. Prescription called into [facility], per pts request. Pt aware and verbalized understanding of instructions. Prescription: 10/20 1200am (midnight)- 50mg  Prednisone 10/20 6am- 50mg  Prednisone 10/20 12pm (noon) - 50mg  Prednisone and 50mg  Benadryl

## 2019-01-31 ENCOUNTER — Ambulatory Visit
Admission: RE | Admit: 2019-01-31 | Discharge: 2019-01-31 | Disposition: A | Discharge status: Home / Self Care | Payer: Medicare Other | Source: Ambulatory Visit | Attending: Gastroenterology | Admitting: Gastroenterology

## 2019-01-31 ENCOUNTER — Other Ambulatory Visit: Payer: Self-pay

## 2019-01-31 DIAGNOSIS — K5900 Constipation, unspecified: Secondary | ICD-10-CM | POA: Diagnosis not present

## 2019-01-31 DIAGNOSIS — R1032 Left lower quadrant pain: Secondary | ICD-10-CM

## 2019-01-31 DIAGNOSIS — K7689 Other specified diseases of liver: Secondary | ICD-10-CM | POA: Diagnosis not present

## 2019-01-31 MED ORDER — IOPAMIDOL (ISOVUE-300) INJECTION 61%
100.0000 mL | Freq: Once | INTRAVENOUS | Status: AC | PRN
  Administered 2019-01-31: 100 mL via INTRAVENOUS

## 2019-03-06 DIAGNOSIS — K582 Mixed irritable bowel syndrome: Secondary | ICD-10-CM | POA: Diagnosis not present

## 2019-03-16 ENCOUNTER — Ambulatory Visit: Payer: Medicare Other | Admitting: Neurology

## 2019-03-20 ENCOUNTER — Encounter: Payer: Self-pay | Admitting: Neurology

## 2019-03-20 ENCOUNTER — Ambulatory Visit (INDEPENDENT_AMBULATORY_CARE_PROVIDER_SITE_OTHER): Payer: Medicare Other | Admitting: Neurology

## 2019-03-20 ENCOUNTER — Other Ambulatory Visit: Payer: Self-pay

## 2019-03-20 VITALS — BP 141/74 | HR 66 | Temp 96.9°F | Ht 61.0 in | Wt 108.5 lb

## 2019-03-20 DIAGNOSIS — M549 Dorsalgia, unspecified: Secondary | ICD-10-CM | POA: Insufficient documentation

## 2019-03-20 DIAGNOSIS — R0789 Other chest pain: Secondary | ICD-10-CM | POA: Diagnosis not present

## 2019-03-20 DIAGNOSIS — R202 Paresthesia of skin: Secondary | ICD-10-CM

## 2019-03-20 DIAGNOSIS — E559 Vitamin D deficiency, unspecified: Secondary | ICD-10-CM | POA: Diagnosis not present

## 2019-03-20 MED ORDER — GABAPENTIN 100 MG PO CAPS: 100.0000 mg | ORAL_CAPSULE | Freq: Three times a day (TID) | ORAL | 11 refills | Status: DC

## 2019-03-20 MED ORDER — LIDOCAINE-PRILOCAINE 2.5-2.5 % EX CREA: TOPICAL_CREAM | CUTANEOUS | 11 refills | Status: DC

## 2019-03-20 MED ORDER — DICLOFENAC SODIUM 1 % EX GEL: CUTANEOUS | 11 refills | Status: DC

## 2019-03-20 NOTE — Progress Notes (Addendum)
PATIENT: Brandy Peterson DOB: 10/23/1930  Chief Complaint  Patient presents with   Thoracic Spine Pain    Reports pain in her upper, left back, present for years.  Her symptoms are starting to affect her left arm and shoulder.  The pain interferes with her normal, daily routine.    PCP    Biagio Borg, MD   Orthopaedic    Melrose Nakayama, MD - referring MD     HISTORICAL  Brandy Peterson is a 85 year old female, seen in request by her primary care physician Dr. Biagio Borg, and orthopedic surgeon Dr. Melrose Nakayama for evaluation of left upper back pain, initial evaluation was on March 20, 2019.  I have reviewed and summarized the referring note from the referring physician.  She has past medical history of hypertension, hypothyroidism, on supplement.  She also has a history of right breast cancer, status post right lobectomy, she lives independently, still driving, mowing her yard, no significant gait abnormality,  She began to develop left upper back pain around 2005, following a shingles outbreak in her lower back, she had deep nerve type pain, it happened about couple times each year, lasting for few days to month, but there was no major limitation in her activity.  But since 2019, she began to have daily left posterior upper chest pain, it most bothersome during the daytime, she has to put her fist to her left upper chest to stop the pain, over the past 1 year, she has tried different remedies, cream, heating pad, with limited help, she even had epidural injection of the left thoracic region, and physical therapy without helping her pain  Personally reviewed MRI of cervical and thoracic spine in July 2019, there was abnormal cord signal within the posterior column at C4-5, upper C6 level, there was no significant abnormality on MRI of thoracic spine  REVIEW OF SYSTEMS: Full 14 system review of systems performed and notable only for as above All other review of systems were  negative.  ALLERGIES: Allergies  Allergen Reactions   Contrast Media [Iodinated Diagnostic Agents] Hives, Itching and Other (See Comments)    Felt like she was going to faint    Adhesive [Tape] Other (See Comments)    Prefers paper tape   Sulfonamide Derivatives Other (See Comments)    Reaction: unknown     HOME MEDICATIONS: Current Outpatient Medications  Medication Sig Dispense Refill   fluticasone (FLONASE) 50 MCG/ACT nasal spray Place 1 spray into both nostrils daily. 48 g 3   levothyroxine (SYNTHROID, LEVOTHROID) 88 MCG tablet Take 1 tablet (88 mcg total) by mouth daily before breakfast. 90 tablet 3   metoprolol succinate (TOPROL-XL) 25 MG 24 hr tablet Take 1 tablet (25 mg total) by mouth daily. 90 tablet 3   omeprazole (PRILOSEC) 20 MG capsule Take 1 capsule (20 mg total) by mouth daily as needed (heartburn). 90 capsule 3   triamterene-hydrochlorothiazide (MAXZIDE-25) 37.5-25 MG tablet Take 1 tablet by mouth daily. 90 tablet 3   No current facility-administered medications for this visit.     PAST MEDICAL HISTORY: Past Medical History:  Diagnosis Date   Backache, unspecified    Carcinoma of breast ([place])    Right   Complication of anesthesia    was told to never use Sodium Pentothal - mother had reaction. Also had trouble with intubation during mastectomy   COPD (chronic obstructive pulmonary disease) ([facility])    Family history of adverse reaction to anesthesia  GERD (gastroesophageal reflux disease)    Hemochromatosis    History of shingles    Hypertension    Hypothyroidism    IBS (irritable bowel syndrome)    Plantar fasciitis    Pneumonia    Raynaud's syndrome    Spastic colon    hx of    Thyroid disease    Varicose veins     PAST SURGICAL HISTORY: Past Surgical History:  Procedure Laterality Date   ABDOMINAL HYSTERECTOMY  1979   PARTIAL   APPENDECTOMY     BONE SPUR  1970   BOTH FEET   CATARACT EXTRACTION  10/2012 and 12/2012    bilateral with lens implants   COLONOSCOPY     KYPHOPLASTY N/A 10/19/2014   Procedure: Lumbar 1 kyphoplasty;  Surgeon: Jovita Gamma, MD;  Location: [facility];  Service: Neurosurgery;  Laterality: N/A;   LUMBAR LAMINECTOMY  1991   L5-S1   MASTECTOMY  1994   RIGHT   MASTECTOMY  1994   Right    FAMILY HISTORY: Family History  Problem Relation Age of Onset   Cancer Mother        metastatic cancer (pancreas vs lung)   Hypertension Mother    Osteoarthritis Mother    Hypothyroidism Mother    Varicose Veins Mother    Bleeding Disorder Mother    Heart disease Father        CAD/MI   Hypertension Father    Arthritis Father    Hemochromatosis Father        Possible   Deep vein thrombosis Father    Varicose Veins Father    Heart attack Father    Heart attack Brother    Hemochromatosis Brother    Heart disease Brother    Hyperlipidemia Son    Diabetes Neg Hx     SOCIAL HISTORY: Social History   Socioeconomic History   Marital status: Widowed    Spouse name: Not on file   Number of children: 1   Years of education: 12   Highest education level: High school graduate  Occupational History   Occupation: Retired  Scientist, product/process development strain: Not hard at [employer]    Worry: Never true    Inability: Never true   Transportation needs    Medical: No    Non-medical: No  Tobacco Use   Smoking status: Former Smoker    Types: Cigarettes    Quit date: 04/13/1988    Years since quitting: 30.9   Smokeless tobacco: Never Used   Tobacco comment: approximate quit date  Substance and Sexual Activity   Alcohol use: Yes    Alcohol/week: 3.0 standard drinks    Types: 3 Glasses of wine per week    Comment: 2oz of wine occasionally   Drug use: No   Sexual activity: Never  Lifestyle   Physical activity    Days per week: 0 days    Minutes per session: 0 min   Stress: Only a little  Relationships   Social  connections    Talks on phone: More than three times a week    Gets together: More than three times a week    Attends religious service: More than 4 times per year    Active member of club or organization: Not on file    Attends meetings of clubs or organizations: Not on file    Relationship status: Widowed   Intimate partner violence    Fear of current  or ex partner: Not on file    Emotionally abused: Not on file    Physically abused: Not on file    Forced sexual activity: Not on file  Other Topics Concern   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 She now lives alone I-ADLs. Pt has 1 son born 91 and 2 grandchildren.       Pt had a colonoscopy on 05/09/2012 by Dr Oletta Lamas.       Pt reports having a pneumo vaccine.      Right-handed.   4-5 cups caffeine daily.   Lives alone.     PHYSICAL EXAM   Vitals:   03/20/19 1306  BP: (!) 141/74  Pulse: 66  Temp: (!) 96.9 F (36.1 C)  Weight: 108 lb 8 oz (49.2 kg)  Height: 5\' 1"  (1.549 m)    Not recorded      Body mass index is 20.5 kg/m.  PHYSICAL EXAMNIATION:  Gen: NAD, conversant, well nourised, well groomed                     Cardiovascular: Regular rate rhythm, no peripheral edema, warm, nontender. Eyes: Conjunctivae clear without exudates or hemorrhage Neck: Supple, no carotid bruits. Pulmonary: Clear to auscultation bilaterally   NEUROLOGICAL EXAM:  MENTAL STATUS: Speech:    Speech is normal; fluent and spontaneous with normal comprehension.  Cognition:     Orientation to time, place and person     Normal recent and remote memory     Normal Attention span and concentration     Normal Language, naming, repeating,spontaneous speech     Fund of knowledge   CRANIAL NERVES: CN II: Visual fields are full to confrontation.Pupils are round equal and briskly reactive to light. CN III, IV, VI: extraocular  movement are normal. No ptosis. CN V: Facial sensation is intact to pinprick in all 3 divisions bilaterally. Corneal responses are intact.  CN VII: Face is symmetric with normal eye closure and smile. CN VIII: Hearing is normal to causal conversation. CN IX, X: Palate elevates symmetrically. Phonation is normal. CN XI: Head turning and shoulder shrug are intact CN XII: Tongue is midline with normal movements and no atrophy.  MOTOR: There is no pronator drift of out-stretched arms. Muscle bulk and tone are normal. Muscle strength is normal.  REFLEXES: Reflexes are 2+ and symmetric at the biceps, triceps, knees, and ankles. Plantar responses are flexor.  SENSORY: Intact to light touch, pinprick and vibratory sensation are intact in fingers and toes.  COORDINATION: There is no trunk or limb dysmetria noted.  GAIT/STANCE: Posture is normal. Gait is steady with normal steps, base, arm swing, and turning. Heel and toe walking are normal. Tandem gait is normal.  Romberg is absent.   DIAGNOSTIC DATA (LABS, IMAGING, TESTING) - I reviewed patient records, labs, notes, testing and imaging myself where available.   ASSESSMENT AND PLAN  Brandy Peterson is a 85 y.o. female    Right posterior upper thoracic pain  History of shingles, and right breast cancer,  CT of the chest to rule out chest wall abnormality  I have suggested gabapentin 100 mg 3 times a day,  EMLA gel mixed with diclofenac gel, heating pad as needed  Abnormal MRI cervical spine,  Evidence of C4-5, C6 cervical spinal cord signal changes  Laboratory evaluations including vitamin B12, copper, and inflammatory markers,  Brandy Peterson  Krista Blue, M.D. Ph.D.  [facility] [address] [place], [place] [address] Ph: [phone] Fax: [phone]  CC: Biagio Borg, MD, Melrose Nakayama, MD

## 2019-03-23 LAB — VITAMIN D 25 HYDROXY (VIT D DEFICIENCY, FRACTURES): Vit D, 25-Hydroxy: 40.9 ng/mL (ref 30.0–100.0)

## 2019-03-23 LAB — VITAMIN B12

## 2019-03-23 LAB — THYROID PANEL WITH TSH
Free Thyroxine Index: 2.2 (ref 1.2–4.9)
T3 Uptake Ratio: 26 % (ref 24–39)
T4, Total: 8.5 ug/dL (ref 4.5–12.0)
TSH: 4.22 u[IU]/mL (ref 0.450–4.500)

## 2019-03-23 LAB — RPR

## 2019-03-23 LAB — ANA W/REFLEX IF POSITIVE: Anti Nuclear Antibody (ANA): NEGATIVE

## 2019-03-23 LAB — COPPER, SERUM: Copper: 155 ug/dL (ref 72–166)

## 2019-03-24 ENCOUNTER — Other Ambulatory Visit: Payer: Medicare Other

## 2019-03-24 ENCOUNTER — Ambulatory Visit: Payer: Medicare Other | Admitting: Hematology

## 2019-04-12 ENCOUNTER — Other Ambulatory Visit: Payer: Medicare Other

## 2019-04-12 ENCOUNTER — Telehealth: Payer: Self-pay | Admitting: Neurology

## 2019-04-12 ENCOUNTER — Ambulatory Visit
Admission: RE | Admit: 2019-04-12 | Discharge: 2019-04-12 | Disposition: A | Discharge status: Home / Self Care | Payer: Medicare Other | Source: Ambulatory Visit | Attending: Neurology | Admitting: Neurology

## 2019-04-12 DIAGNOSIS — M549 Dorsalgia, unspecified: Secondary | ICD-10-CM

## 2019-04-12 DIAGNOSIS — R0789 Other chest pain: Secondary | ICD-10-CM

## 2019-04-12 DIAGNOSIS — R9389 Abnormal findings on diagnostic imaging of other specified body structures: Secondary | ICD-10-CM | POA: Insufficient documentation

## 2019-04-12 NOTE — Telephone Encounter (Signed)
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  Dominant 6 mm nodule anteriorly in the right middle lobe, likely related to the infectious/inflammatory process. However, given the lack of prior imaging to document stability, follow-up is warranted.  Non-contrast chest CT at 3-6 months is recommended. If the nodules are stable at time of repeat CT, then future CT at 18-24 months (from today's scan) is considered optional for low-risk patients, but is recommended for high-risk patients. This recommendation follows the consensus statement: Guidelines for Management of Incidental Pulmonary Nodules Detected on CT Images: From the Fleischner Society 2017; Radiology 2017; 284:228-243.   I have called patient, there is evidence of right middle lobe and lingular bronchiectasis, suggest sequelae of chronic atypical mycobacterial infection, I have put urgent referral for pulmonary

## 2019-04-24 ENCOUNTER — Inpatient Hospital Stay: Payer: Medicare Other | Attending: Hematology

## 2019-04-24 ENCOUNTER — Other Ambulatory Visit: Payer: Self-pay | Admitting: *Deleted

## 2019-04-24 ENCOUNTER — Inpatient Hospital Stay (HOSPITAL_BASED_OUTPATIENT_CLINIC_OR_DEPARTMENT_OTHER): Payer: Medicare Other | Admitting: Hematology

## 2019-04-24 ENCOUNTER — Other Ambulatory Visit: Payer: Self-pay

## 2019-04-24 LAB — CBC WITH DIFFERENTIAL (CANCER CENTER ONLY)
Abs Immature Granulocytes: 0.05 10*3/uL (ref 0.00–0.07)
Basophils Absolute: 0 10*3/uL (ref 0.0–0.1)
Basophils Relative: 1 %
Eosinophils Absolute: 0.2 10*3/uL (ref 0.0–0.5)
Eosinophils Relative: 3 %
HCT: 43.3 % (ref 36.0–46.0)
Hemoglobin: 15.2 g/dL — ABNORMAL HIGH (ref 12.0–15.0)
Immature Granulocytes: 1 %
Lymphocytes Relative: 15 %
Lymphs Abs: 1.2 10*3/uL (ref 0.7–4.0)
MCH: 33.3 pg (ref 26.0–34.0)
MCHC: 35.1 g/dL (ref 30.0–36.0)
MCV: 94.7 fL (ref 80.0–100.0)
Monocytes Absolute: 0.6 10*3/uL (ref 0.1–1.0)
Monocytes Relative: 8 %
Neutro Abs: 5.9 10*3/uL (ref 1.7–7.7)
Neutrophils Relative %: 72 %
Platelet Count: 265 10*3/uL (ref 150–400)
RBC: 4.57 MIL/uL (ref 3.87–5.11)
RDW: 12.4 % (ref 11.5–15.5)
WBC Count: 8 10*3/uL (ref 4.0–10.5)
nRBC: 0 % (ref 0.0–0.2)

## 2019-04-24 LAB — CMP (CANCER CENTER ONLY)
ALT: 19 U/L (ref 0–44)
AST: 23 U/L (ref 15–41)
Albumin: 4 g/dL (ref 3.5–5.0)
Alkaline Phosphatase: 91 U/L (ref 38–126)
Anion gap: 10 (ref 5–15)
BUN: 18 mg/dL (ref 8–23)
CO2: 27 mmol/L (ref 22–32)
Calcium: 8.9 mg/dL (ref 8.9–10.3)
Chloride: 100 mmol/L (ref 98–111)
Creatinine: 0.8 mg/dL (ref 0.44–1.00)
GFR, Est AFR Am: >60 mL/min (ref >60)
GFR, Estimated: >60 mL/min (ref >60)
Glucose, Bld: 97 mg/dL (ref 70–99)
Potassium: 4.7 mmol/L (ref 3.5–5.1)
Sodium: 137 mmol/L (ref 135–145)
Total Bilirubin: 0.6 mg/dL (ref 0.3–1.2)
Total Protein: 7 g/dL (ref 6.5–8.1)

## 2019-04-24 LAB — RETICULOCYTES
Immature Retic Fract: 6 % (ref 2.3–15.9)
RBC.: 4.6 MIL/uL (ref 3.87–5.11)
Retic Count, Absolute: 110.9 10*3/uL (ref 19.0–186.0)
Retic Ct Pct: 2.4 % (ref 0.4–3.1)

## 2019-04-24 LAB — FERRITIN: Ferritin: 49 ng/mL (ref 11–307)

## 2019-04-24 NOTE — Progress Notes (Signed)
Marland Kitchen    HEMATOLOGY/ONCOLOGY CLINIC NOTE  Date of Service: .09/25/2016  Patient Care Team: Biagio Borg, MD as PCP - General (Internal Medicine) Gordy Levan, MD (Hematology and Oncology) Druscilla Brownie, MD (Dermatology) Laurence Spates, MD (Gastroenterology) Melrose Nakayama, MD as Consulting Physician (Orthopedic Surgery)  CHIEF COMPLAINTS/PURPOSE OF CONSULTATION:  F/u for Hemochromatosis  HISTORY OF PRESENTING ILLNESS:   Brandy Peterson is a wonderful 86 y.o. female who has been referred to [place] by Dr Marko Plume for ongoing management of hemochromatosis. She notes that she has a family history of hemochromatosis and was diagnosed in the 55s. She has required maintenance therapy to phlebotomies roughly every 6 months to try to keep her ferritin levels below 50. She notes no known history of liver disease or heart disease. She reports that her ferritin levels even on diagnosis but not more than 1000. She does not recollect what her genetic mutation studies showed. Patient today has no acute new concerns. No new skin pigmentation changes. No new fatigue. No abdominal pain or distention. No shortness of breath or DOE.  INTERVAL HISTORY: Ms. Stater is here for f/u of her hemochromatosis. The patient's last visit with [place] was on 03/30/2018. The pt reports that she is doing well overall.  The pt reports that she was having some intestinal/bowel issues in the interim. Her PCP, Dr. Oletta Lamas, retired at the end of December. She had an abdominal CT on 01/31/19 which showed no acute concerns. Dr. Oletta Lamas discouraged pt from getting a Colonoscopy due to age and a spastic colon. Pt denies diarrhea but says that her bowel movements resemble baby food and often times "shoot out of her". Pt has been having some issues with urinary incontinence but denies dysuria.   She has also continued having back pain and has recently been experiencing knee pain. Pt's back pain used to be intermittent and generalized  but has recently become more constant and localized. She has been seen by an Orthopedist for her knee pain and they referred pt to a Neurologist. She has not yet had a resolution to her back pain.   Pt has been trying to receive the COVID19 vaccine but the spots have been filled so far.   Lab results today (04/24/19) of CBC w/diff and CMP is as follows: all values are WNL except for Hgb at 15.2.  04/24/2019 Ferritin at 49 04/24/2019 Reticulocytes is as follows: all values are WNL  On review of systems, pt reports chronic back pain, knee pain, loose stools, urinary incontinence and denies diarrhea, abdominal pain, dysuria and any other symptoms.   MEDICAL HISTORY:  Past Medical History:  Diagnosis Date  . Backache, unspecified   . Carcinoma of breast ([place])    Right  . Complication of anesthesia    was told to never use Sodium Pentothal - mother had reaction. Also had trouble with intubation during mastectomy  . COPD (chronic obstructive pulmonary disease) ([place])   . Family history of adverse reaction to anesthesia   . GERD (gastroesophageal reflux disease)   . Hemochromatosis   . History of shingles   . Hypertension   . Hypothyroidism   . IBS (irritable bowel syndrome)   . Plantar fasciitis   . Pneumonia   . Raynaud's syndrome   . Spastic colon    hx of   . Thyroid disease   . Varicose veins     SURGICAL HISTORY: Past Surgical History:  Procedure Laterality Date  . ABDOMINAL HYSTERECTOMY  1979   PARTIAL  .  APPENDECTOMY    . BONE SPUR  1970   BOTH FEET  . CATARACT EXTRACTION  10/2012 and 12/2012   bilateral with lens implants  . COLONOSCOPY    . KYPHOPLASTY N/A 10/19/2014   Procedure: Lumbar 1 kyphoplasty;  Surgeon: Jovita Gamma, MD;  Location: [facility];  Service: Neurosurgery;  Laterality: N/A;  . LUMBAR LAMINECTOMY  1991   L5-S1  . MASTECTOMY  1994   RIGHT  . MASTECTOMY  1994   Right    SOCIAL HISTORY: Social History   Socioeconomic History  . Marital  status: Widowed    Spouse name: Not on file  . Number of children: 1  . Years of education: 32  . Highest education level: High school graduate  Occupational History  . Occupation: Retired  Tobacco Use  . Smoking status: Former Smoker    Types: Cigarettes    Quit date: 04/13/1988    Years since quitting: 31.0  . Smokeless tobacco: Never Used  . Tobacco comment: approximate quit date  Substance and Sexual Activity  . Alcohol use: Yes    Alcohol/week: 3.0 standard drinks    Types: 3 Glasses of wine per week    Comment: 2oz of wine occasionally  . Drug use: No  . Sexual activity: Never  Other Topics Concern  . 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 She now lives alone I-ADLs. Pt has 1 son born 3 and 2 grandchildren.       Pt had a colonoscopy on 05/09/2012 by Dr Oletta Lamas.       Pt reports having a pneumo vaccine.      Right-handed.   4-5 cups caffeine daily.   Lives alone.   Social Determinants of Health   Financial Resource Strain:   . Difficulty of Paying Living Expenses: Not on file  Food Insecurity:   . Worried About Charity fundraiser in the Last Year: Not on file  . Ran Out of Food in the Last Year: Not on file  Transportation Needs:   . Lack of Transportation (Medical): Not on file  . Lack of Transportation (Non-Medical): Not on file  Physical Activity:   . Days of Exercise per Week: Not on file  . Minutes of Exercise per Session: Not on file  Stress:   . Feeling of Stress : Not on file  Social Connections:   . Frequency of Communication with Friends and Family: Not on file  . Frequency of Social Gatherings with Friends and Family: Not on file  . Attends Religious Services: Not on file  . Active Member of Clubs or Organizations: Not on file  . Attends Archivist Meetings: Not on file  . Marital Status: Not on file  Intimate Partner Violence:    . Fear of Current or Ex-Partner: Not on file  . Emotionally Abused: Not on file  . Physically Abused: Not on file  . Sexually Abused: Not on file    FAMILY HISTORY: Family History  Problem Relation Age of Onset  . Cancer Mother        metastatic cancer (pancreas vs lung)  . Hypertension Mother   . Osteoarthritis Mother   . Hypothyroidism Mother   . Varicose Veins Mother   . Bleeding Disorder Mother   . Heart disease Father        CAD/MI  . Hypertension Father   . Arthritis  Father   . Hemochromatosis Father        Possible  . Deep vein thrombosis Father   . Varicose Veins Father   . Heart attack Father   . Heart attack Brother   . Hemochromatosis Brother   . Heart disease Brother   . Hyperlipidemia Son   . Diabetes Neg Hx     ALLERGIES:  is allergic to contrast media [iodinated diagnostic agents]; adhesive [tape]; and sulfonamide derivatives.  MEDICATIONS:  Current Outpatient Medications  Medication Sig Dispense Refill  . diclofenac Sodium (VOLTAREN) 1 % GEL 4 gram three times as needed. 100 g 11  . fluticasone (FLONASE) 50 MCG/ACT nasal spray Place 1 spray into both nostrils daily. 48 g 3  . gabapentin (NEURONTIN) 100 MG capsule Take 1 capsule (100 mg total) by mouth 3 (three) times daily. 90 capsule 11  . levothyroxine (SYNTHROID, LEVOTHROID) 88 MCG tablet Take 1 tablet (88 mcg total) by mouth daily before breakfast. 90 tablet 3  . lidocaine-prilocaine (EMLA) cream 4 gram tid prn 30 g 11  . metoprolol succinate (TOPROL-XL) 25 MG 24 hr tablet Take 1 tablet (25 mg total) by mouth daily. 90 tablet 3  . omeprazole (PRILOSEC) 20 MG capsule Take 1 capsule (20 mg total) by mouth daily as needed (heartburn). 90 capsule 3  . triamterene-hydrochlorothiazide (MAXZIDE-25) 37.5-25 MG tablet Take 1 tablet by mouth daily. 90 tablet 3   No current facility-administered medications for this visit.    REVIEW OF SYSTEMS:   A 10+ POINT REVIEW OF SYSTEMS WAS OBTAINED including  neurology, dermatology, psychiatry, cardiac, respiratory, lymph, extremities, GI, GU, Musculoskeletal, constitutional, breasts, reproductive, HEENT.  All pertinent positives are noted in the HPI.  All others are negative.    PHYSICAL EXAMINATION: ECOG PERFORMANCE STATUS: 1 - Symptomatic but completely ambulatory . Vitals:   04/24/19 1243  BP: (!) 167/72  Pulse: 82  Resp: 18  Temp: 98.5 F (36.9 C)  SpO2: 98%   Filed Weights   04/24/19 1243  Weight: 106 lb 14.4 oz (48.5 kg)   .Body mass index is 20.2 kg/m.  Exam was given in a chair   GENERAL:alert, in no acute distress and comfortable SKIN: no acute rashes, no significant lesions EYES: conjunctiva are pink and non-injected, sclera anicteric OROPHARYNX: MMM, no exudates, no oropharyngeal erythema or ulceration NECK: supple, no JVD LYMPH:  no palpable lymphadenopathy in the cervical, axillary or inguinal regions LUNGS: clear to auscultation b/l with normal respiratory effort HEART: regular rate & rhythm ABDOMEN:  normoactive bowel sounds , non tender, not distended. No palpable hepatosplenomegaly.  Extremity: no pedal edema PSYCH: alert & oriented x 3 with fluent speech NEURO: no focal motor/sensory deficits  LABORATORY DATA:  I have reviewed the data as listed  . CBC Latest Ref Rng & Units 03/30/2018 09/29/2017 06/16/2017  WBC 4.0 - 10.5 K/uL 5.8 7.2 8.2  Hemoglobin 12.0 - 15.0 g/dL 14.3 14.1 15.8(H)  Hematocrit 36.0 - 46.0 % 41.0 40.4 45.1  Platelets 150 - 400 K/uL 261 214 281.0    . CMP Latest Ref Rng & Units 03/30/2018 06/16/2017 03/23/2017  Glucose 70 - 99 mg/dL 80 88 83  BUN 8 - 23 mg/dL 17 16 11.2  Creatinine 0.44 - 1.00 mg/dL 0.79 0.69 0.8  Sodium 135 - 145 mmol/L 137 135 139  Potassium 3.5 - 5.1 mmol/L 4.3 3.9 3.4(L)  Chloride 98 - 111 mmol/L 102 98 -  CO2 22 - 32 mmol/L 27 32 27  Calcium 8.9 - 10.3  mg/dL 9.0 9.8 9.1  Total Protein 6.5 - 8.1 g/dL 6.5 7.4 6.8  Total Bilirubin 0.3 - 1.2 mg/dL 0.6 0.7 0.67   Alkaline Phos 38 - 126 U/L 91 89 86  AST 15 - 41 U/L 23 21 23   ALT 0 - 44 U/L 19 15 17    . Lab Results  Component Value Date   IRON 149 (H) 03/23/2017   TIBC 255 03/23/2017   IRONPCTSAT 58 (H) 03/23/2017   (Iron and TIBC)  Lab Results  Component Value Date   FERRITIN 49 04/24/2019    Hemochromatosis DNA-PCR(c282y,h63d)  Order: [id number]  Status:  Final result Visible to patient:  Yes (MyChart) Next appt:  10/06/2017 at 10:45 AM in [facility]) Dx:  Hereditary hemochromatosis ([place])  Component 3wk ago  Hemochromatosis Gene Comment   Comment: Result: AFFECTED  Two copies of the same mutation (C282Y and C282Y) identified         RADIOGRAPHIC STUDIES: I have personally reviewed the radiological images as listed and agreed with the findings in the report. CT CHEST WO CONTRAST  Result Date: 04/12/2019 CLINICAL DATA:  Right chest pain EXAM: CT CHEST WITHOUT CONTRAST TECHNIQUE: Multidetector CT imaging of the chest was performed following the standard protocol without IV contrast. COMPARISON:  None. FINDINGS: Cardiovascular: The heart is normal in size. No pericardial effusion. No evidence of thoracic aortic aneurysm. Atherosclerotic calcifications of the aortic arch. Coronary atherosclerosis the LAD and right coronary artery. Mediastinum/Nodes: Mild mediastinal lymph nodes which do not meet pathologic CT size criteria. Visualized thyroid is unremarkable. Lungs/Pleura: Biapical pleural-parenchymal scarring. Mild centrilobular emphysematous changes, upper lung predominant. Bronchiectasis in the right middle lobe and lingula. Associated mild tree-in-bud nodularity in the lingula, suggesting sequela of chronic atypical mycobacterial infection. Dominant 6 mm nodule anteriorly in the right middle lobe (series 3/image 76), likely related to the infectious/inflammatory process. No focal consolidation. No pleural effusion or pneumothorax. Upper Abdomen: Visualized upper abdomen  is grossly unremarkable, noting vascular calcifications. Musculoskeletal: Degenerative changes of the visualized thoracolumbar spine. Prior vertebral augmentation at L1. 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 Dominant 6 mm nodule anteriorly in the right middle lobe, likely related to the infectious/inflammatory process. However, given the lack of prior imaging to document stability, follow-up is warranted. Non-contrast chest CT at 3-6 months is recommended. If the nodules are stable at time of repeat CT, then future CT at 18-24 months (from today's scan) is considered optional for low-risk patients, but is recommended for high-risk patients. This recommendation follows the consensus statement: Guidelines for Management of Incidental Pulmonary Nodules Detected on CT Images: From the Fleischner Society 2017; Radiology 2017; 284:228-243. Aortic Atherosclerosis (ICD10-I70.0) and Emphysema (ICD10-J43.9). Electronically Signed   By: Julian Hy M.D.   On: 04/12/2019 17:55    ASSESSMENT & PLAN:   86 y.o. female with  #1 history of hemochromatosis . Homozygous C282Y mutation  Patient notes that she has never had a ferritin level more than 1000. Family history positive. Her goal has been to keep her ferritin levels below 50. She has roughly needed a therapeutic phlebotomy every 6 months or so.  PLAN:  -Discussed pt labwork today, 04/24/19; blood counts are okay, blood chemistries are nml -Discussed 04/24/2019 Ferritin is WNL at 49 -Discussed 04/24/2019 Reticulocytes is as follows: all values are WNL -No indication for a therapeutic phlebotomy at this time, 04/24/19 Hgb at 15.2 -Recommended pt receive COVID19 vaccine when available -  Continue to f/u with Neurology for back pain -Will tentatively set up next phlebotomy in 6 months, with labs one week prior  -Will see the pt back in 12  months with labs   #2 T1N0 node negative right breast cancer 1994, post mastectomy with nodes, 5 years of adjuvant tamoxifen. No clinical evidence of breast cancer recurrence at this time . Plan  - continue yearly mammograms with primary care physician  -She has required 3D tomo mammography due to extremely dense breast tissue.   FOLLOW UP: Please cancel therapeutic phlebotomy appointment scheduled for 05/01/2019 Labs and therapeutic phlebtomy appointment in 6 months (lab 1 week prior to phlebotomy) RTC with Dr Irene Limbo in 12 months with labs   The total time spent in the appt was 20 minutes and more than 50% was on counseling and direct patient cares.  All of the patient's questions were answered with apparent satisfaction. The patient knows to call the clinic with any problems, questions or concerns.    Sullivan Lone MD [place] AAHIVMS [facility] Hematology/Oncology Physician [facility]  (Office):       [phone] (Work cell):  [phone] (Fax):           [phone]   I, Yevette Edwards, am acting as a scribe for Dr. Sullivan Lone.   .I have reviewed the above documentation for accuracy and completeness, and I agree with the above. Brunetta Genera MD

## 2019-04-25 ENCOUNTER — Telehealth: Payer: Self-pay | Admitting: Hematology

## 2019-04-25 NOTE — Telephone Encounter (Signed)
Scheduled appt per 1/11 los.  Sent a message to HIM pool to get a calendar mailed out. 

## 2019-05-04 DIAGNOSIS — Z23 Encounter for immunization: Secondary | ICD-10-CM | POA: Diagnosis not present

## 2019-05-11 ENCOUNTER — Other Ambulatory Visit: Payer: Self-pay | Admitting: Internal Medicine

## 2019-05-11 NOTE — Telephone Encounter (Signed)
Please refill as per office routine med refill policy (all routine meds refilled for 3 mo or monthly per pt preference up to one year from last visit, then month to month grace period for 3 mo, then further med refills will have to be denied)  

## 2019-05-16 DIAGNOSIS — D692 Other nonthrombocytopenic purpura: Secondary | ICD-10-CM | POA: Diagnosis not present

## 2019-05-16 DIAGNOSIS — L820 Inflamed seborrheic keratosis: Secondary | ICD-10-CM | POA: Diagnosis not present

## 2019-05-16 DIAGNOSIS — D0462 Carcinoma in situ of skin of left upper limb, including shoulder: Secondary | ICD-10-CM | POA: Diagnosis not present

## 2019-05-16 DIAGNOSIS — D485 Neoplasm of uncertain behavior of skin: Secondary | ICD-10-CM | POA: Diagnosis not present

## 2019-05-16 DIAGNOSIS — L570 Actinic keratosis: Secondary | ICD-10-CM | POA: Diagnosis not present

## 2019-05-31 DIAGNOSIS — C50919 Malignant neoplasm of unspecified site of unspecified female breast: Secondary | ICD-10-CM | POA: Diagnosis not present

## 2019-06-05 ENCOUNTER — Ambulatory Visit: Payer: Medicare Other

## 2019-06-09 DIAGNOSIS — Z23 Encounter for immunization: Secondary | ICD-10-CM | POA: Diagnosis not present

## 2019-07-04 DIAGNOSIS — K5900 Constipation, unspecified: Secondary | ICD-10-CM | POA: Diagnosis not present

## 2019-07-04 DIAGNOSIS — R102 Pelvic and perineal pain: Secondary | ICD-10-CM | POA: Diagnosis not present

## 2019-08-03 DIAGNOSIS — R159 Full incontinence of feces: Secondary | ICD-10-CM | POA: Diagnosis not present

## 2019-08-03 DIAGNOSIS — K589 Irritable bowel syndrome without diarrhea: Secondary | ICD-10-CM | POA: Diagnosis not present

## 2019-08-07 ENCOUNTER — Encounter: Payer: Self-pay | Admitting: Internal Medicine

## 2019-08-07 ENCOUNTER — Ambulatory Visit (INDEPENDENT_AMBULATORY_CARE_PROVIDER_SITE_OTHER): Payer: Medicare Other | Admitting: Internal Medicine

## 2019-08-07 ENCOUNTER — Other Ambulatory Visit: Payer: Self-pay

## 2019-08-07 VITALS — BP 140/60 | HR 75 | Temp 98.5°F | Ht 61.0 in | Wt 106.0 lb

## 2019-08-07 DIAGNOSIS — R109 Unspecified abdominal pain: Secondary | ICD-10-CM

## 2019-08-07 DIAGNOSIS — E039 Hypothyroidism, unspecified: Secondary | ICD-10-CM | POA: Diagnosis not present

## 2019-08-07 DIAGNOSIS — F411 Generalized anxiety disorder: Secondary | ICD-10-CM | POA: Diagnosis not present

## 2019-08-07 DIAGNOSIS — I10 Essential (primary) hypertension: Secondary | ICD-10-CM | POA: Diagnosis not present

## 2019-08-07 DIAGNOSIS — J449 Chronic obstructive pulmonary disease, unspecified: Secondary | ICD-10-CM | POA: Diagnosis not present

## 2019-08-07 MED ORDER — TRAMADOL HCL 50 MG PO TABS: 50.0000 mg | ORAL_TABLET | Freq: Four times a day (QID) | ORAL | 0 refills | Status: DC | PRN

## 2019-08-07 MED ORDER — FLUTICASONE PROPIONATE 50 MCG/ACT NA SUSP: 1.0000 | Freq: Every day | NASAL | 3 refills | Status: DC

## 2019-08-07 MED ORDER — LEVOTHYROXINE SODIUM 88 MCG PO TABS: 88.0000 ug | ORAL_TABLET | Freq: Every day | ORAL | 3 refills | Status: DC

## 2019-08-07 MED ORDER — TRIAMTERENE-HCTZ 37.5-25 MG PO TABS: 1.0000 | ORAL_TABLET | Freq: Every day | ORAL | 3 refills | Status: DC

## 2019-08-07 MED ORDER — METOPROLOL SUCCINATE ER 25 MG PO TB24: 25.0000 mg | ORAL_TABLET | Freq: Every day | ORAL | 3 refills | Status: DC

## 2019-08-07 NOTE — Assessment & Plan Note (Signed)
stable overall by history and exam, recent data reviewed with pt, and pt to continue medical treatment as before,  to f/u any worsening symptoms or concerns  

## 2019-08-07 NOTE — Progress Notes (Signed)
Subjective:    Patient ID: Brandy Peterson, female    DOB: 01-10-1931, 86 y.o.   MRN: [id number]  HPI  Here for yearly f/u;  Overall doing ok;  Pt denies Chest pain, worsening SOB, DOE, wheezing, orthopnea, PND, worsening LE edema, palpitations, dizziness or syncope.  Pt denies neurological change such as new headache, facial or extremity weakness.  Pt denies polydipsia, polyuria, or low sugar symptoms. Pt states overall good compliance with treatment and medications, good tolerability, and has been trying to follow appropriate diet.  Pt denies worsening depressive symptoms, suicidal ideation or panic. No fever, night sweats, wt loss, loss of appetite, or other constitutional symptoms.  Pt states good ability with ADL's, has low fall risk, home safety reviewed and adequate, no other significant changes in hearing or vision,  Has chronic recurring years of pain to the left flank area after seeing NS, pain management, PT, ortho, and neurology who saw her last and tx with what sounds like gabapentin but made her off balance and dizzy so had to stop  Tyelnol and asa no help.  Has not tried tramadol.  Heat can help somewhat with sitting still.  Worse off and on all night.   Past Medical History:  Diagnosis Date  . Backache, unspecified   . Carcinoma of breast ([place])    Right  . Complication of anesthesia    was told to never use Sodium Pentothal - mother had reaction. Also had trouble with intubation during mastectomy  . COPD (chronic obstructive pulmonary disease) ([place])   . Family history of adverse reaction to anesthesia   . GERD (gastroesophageal reflux disease)   . Hemochromatosis   . History of shingles   . Hypertension   . Hypothyroidism   . IBS (irritable bowel syndrome)   . Plantar fasciitis   . Pneumonia   . Raynaud's syndrome   . Spastic colon    hx of   . Thyroid disease   . Varicose veins    Past Surgical History:  Procedure Laterality Date  . ABDOMINAL HYSTERECTOMY  1979   PARTIAL  . APPENDECTOMY    . BONE SPUR  1970   BOTH FEET  . CATARACT EXTRACTION  10/2012 and 12/2012   bilateral with lens implants  . COLONOSCOPY    . KYPHOPLASTY N/A 10/19/2014   Procedure: Lumbar 1 kyphoplasty;  Surgeon: Jovita Gamma, MD;  Location: [place] [facility];  Service: Neurosurgery;  Laterality: N/A;  . LUMBAR LAMINECTOMY  1991   L5-S1  . MASTECTOMY  1994   RIGHT  . MASTECTOMY  1994   Right    reports that she quit smoking about 31 years ago. Her smoking use included cigarettes. She has never used smokeless tobacco. She reports current alcohol use of about 3.0 standard drinks of alcohol per week. She reports that she does not use drugs. family history includes Arthritis in her father; Bleeding Disorder in her mother; Cancer in her mother; Deep vein thrombosis in her father; Heart attack in her brother and father; Heart disease in her brother and father; Hemochromatosis in her brother and father; Hyperlipidemia in her son; Hypertension in her father and mother; Hypothyroidism in her mother; Osteoarthritis in her mother; Varicose Veins in her father and mother. Allergies  Allergen Reactions  . Contrast Media [Iodinated Diagnostic Agents] Hives, Itching and Other (See Comments)    Felt like she was going to faint   . Adhesive [Tape] Other (See Comments)    Prefers paper tape  .  Sulfonamide Derivatives Other (See Comments)    Reaction: unknown    Current Outpatient Medications on File Prior to Visit  Medication Sig Dispense Refill  . omeprazole (PRILOSEC) 20 MG capsule Take 1 capsule (20 mg total) by mouth daily as needed (heartburn). 90 capsule 3   No current facility-administered medications on file prior to visit.   Review of Systems All otherwise neg per pt     Objective:   Physical Exam BP 140/60 (BP Location: Left Arm, Patient Position: Sitting, Cuff Size: Small)   Pulse 75   Temp 98.5 F (36.9 C) (Oral)   Ht 5\' 1"  (1.549 m)   Wt 106 lb (48.1 kg)   SpO2 96%   BMI  20.03 kg/m  VS noted,  Constitutional: Pt appears in NAD HENT: Head: NCAT.  Right Ear: External ear normal.  Left Ear: External ear normal.  Eyes: . Pupils are equal, round, and reactive to light. Conjunctivae and EOM are normal Nose: without d/c or deformity Neck: Neck supple. Gross normal ROM Cardiovascular: Normal rate and regular rhythm.   Pulmonary/Chest: Effort normal and breath sounds without rales or wheezing.  Abd:  Soft, NT, ND, + BS, no organomegaly Neurological: Pt is alert. At baseline orientation, motor grossly intact Skin: Skin is warm. No rashes, other new lesions, no LE edema Psychiatric: Pt behavior is normal without agitation  All otherwise neg per pt  Lab Results  Component Value Date   WBC 8.0 04/24/2019   HGB 15.2 (H) 04/24/2019   HCT 43.3 04/24/2019   PLT 265 04/24/2019   GLUCOSE 97 04/24/2019   CHOL 202 (H) 05/25/2018   TRIG 109.0 05/25/2018   HDL 59.40 05/25/2018   LDLDIRECT 93.6 12/26/2010   LDLCALC 121 (H) 05/25/2018   ALT 19 04/24/2019   AST 23 04/24/2019   NA 137 04/24/2019   K 4.7 04/24/2019   CL 100 04/24/2019   CREATININE 0.80 04/24/2019   BUN 18 04/24/2019   CO2 27 04/24/2019   TSH 4.220 03/20/2019      Assessment & Plan:

## 2019-08-07 NOTE — Patient Instructions (Signed)
Please take all new medication as prescribed - the pain medication  Please call for refills as needed  Please continue all other medications as before, and refills have been done if requested.  Please have the pharmacy call with any other refills you may need.  Please continue your efforts at being more active, low cholesterol diet, and weight control.  You are otherwise up to date with prevention measures today.  Please keep your appointments with your specialists as you may have planned - Dr Cristina Gong  Please make an Appointment to return in 6 months, or sooner if needed

## 2019-08-07 NOTE — Assessment & Plan Note (Addendum)
For tramadol prn,  to f/u any worsening symptoms or concerns  I spent 41 minutes in preparing to see the patient by review of recent labs, imaging and procedures, obtaining and reviewing separately obtained history, communicating with the patient and family or caregiver, ordering medications, tests or procedures, and documenting clinical information in the EHR including the differential Dx, treatment, and any further evaluation and other management of left flank pain chronic, copd, hypothyroidism, hTN, anxiety

## 2019-10-13 ENCOUNTER — Other Ambulatory Visit: Payer: Self-pay | Admitting: *Deleted

## 2019-10-17 ENCOUNTER — Inpatient Hospital Stay: Payer: Medicare Other | Attending: Hematology

## 2019-10-17 ENCOUNTER — Other Ambulatory Visit: Payer: Self-pay

## 2019-10-17 LAB — CBC WITH DIFFERENTIAL (CANCER CENTER ONLY)
Abs Immature Granulocytes: 0.04 10*3/uL (ref 0.00–0.07)
Basophils Absolute: 0 10*3/uL (ref 0.0–0.1)
Basophils Relative: 1 %
Eosinophils Absolute: 0.3 10*3/uL (ref 0.0–0.5)
Eosinophils Relative: 4 %
HCT: 40.4 % (ref 36.0–46.0)
Hemoglobin: 14.2 g/dL (ref 12.0–15.0)
Immature Granulocytes: 1 %
Lymphocytes Relative: 20 %
Lymphs Abs: 1.3 10*3/uL (ref 0.7–4.0)
MCH: 32.7 pg (ref 26.0–34.0)
MCHC: 35.1 g/dL (ref 30.0–36.0)
MCV: 93.1 fL (ref 80.0–100.0)
Monocytes Absolute: 0.6 10*3/uL (ref 0.1–1.0)
Monocytes Relative: 10 %
Neutro Abs: 4.2 10*3/uL (ref 1.7–7.7)
Neutrophils Relative %: 64 %
Platelet Count: 216 10*3/uL (ref 150–400)
RBC: 4.34 MIL/uL (ref 3.87–5.11)
RDW: 12.2 % (ref 11.5–15.5)
WBC Count: 6.4 10*3/uL (ref 4.0–10.5)
nRBC: 0 % (ref 0.0–0.2)

## 2019-10-17 LAB — CMP (CANCER CENTER ONLY)
ALT: 16 U/L (ref 0–44)
AST: 20 U/L (ref 15–41)
Albumin: 3.6 g/dL (ref 3.5–5.0)
Alkaline Phosphatase: 82 U/L (ref 38–126)
Anion gap: 9 (ref 5–15)
BUN: 16 mg/dL (ref 8–23)
CO2: 26 mmol/L (ref 22–32)
Calcium: 8.7 mg/dL — ABNORMAL LOW (ref 8.9–10.3)
Chloride: 104 mmol/L (ref 98–111)
Creatinine: 0.87 mg/dL (ref 0.44–1.00)
GFR, Est AFR Am: >60 mL/min (ref >60)
GFR, Estimated: >60 mL/min (ref >60)
Glucose, Bld: 87 mg/dL (ref 70–99)
Potassium: 4 mmol/L (ref 3.5–5.1)
Sodium: 139 mmol/L (ref 135–145)
Total Bilirubin: 0.7 mg/dL (ref 0.3–1.2)
Total Protein: 6.5 g/dL (ref 6.5–8.1)

## 2019-10-17 LAB — FERRITIN: Ferritin: 119 ng/mL (ref 11–307)

## 2019-10-17 LAB — RETICULOCYTES
Immature Retic Fract: 6.6 % (ref 2.3–15.9)
RBC.: 4.37 MIL/uL (ref 3.87–5.11)
Retic Count, Absolute: 83.9 10*3/uL (ref 19.0–186.0)
Retic Ct Pct: 1.9 % (ref 0.4–3.1)

## 2019-10-23 ENCOUNTER — Inpatient Hospital Stay: Payer: Medicare Other

## 2019-10-23 ENCOUNTER — Other Ambulatory Visit: Payer: Self-pay

## 2019-10-23 NOTE — Patient Instructions (Signed)

## 2019-10-23 NOTE — Progress Notes (Signed)
Brandy Peterson presents today for phlebotomy per MD orders for ferritin > 50. Phlebotomy procedure started at 0922 via 18G to L AC and ended at 0930. 511 grams removed. IV needle removed intact. Patient offered food and fluids and observed for 30 minutes after procedure without any incident. Patient tolerated procedure well.

## 2019-10-30 DIAGNOSIS — J0100 Acute maxillary sinusitis, unspecified: Secondary | ICD-10-CM | POA: Diagnosis not present

## 2019-10-30 DIAGNOSIS — I10 Essential (primary) hypertension: Secondary | ICD-10-CM | POA: Diagnosis not present

## 2019-10-31 DIAGNOSIS — T63443A Toxic effect of venom of bees, assault, initial encounter: Secondary | ICD-10-CM | POA: Diagnosis not present

## 2019-11-17 DIAGNOSIS — R300 Dysuria: Secondary | ICD-10-CM | POA: Diagnosis not present

## 2019-11-17 DIAGNOSIS — B373 Candidiasis of vulva and vagina: Secondary | ICD-10-CM | POA: Diagnosis not present

## 2019-11-21 DIAGNOSIS — I8311 Varicose veins of right lower extremity with inflammation: Secondary | ICD-10-CM | POA: Diagnosis not present

## 2019-11-21 DIAGNOSIS — D0471 Carcinoma in situ of skin of right lower limb, including hip: Secondary | ICD-10-CM | POA: Diagnosis not present

## 2019-11-21 DIAGNOSIS — L820 Inflamed seborrheic keratosis: Secondary | ICD-10-CM | POA: Diagnosis not present

## 2019-11-21 DIAGNOSIS — L821 Other seborrheic keratosis: Secondary | ICD-10-CM | POA: Diagnosis not present

## 2019-11-21 DIAGNOSIS — I8312 Varicose veins of left lower extremity with inflammation: Secondary | ICD-10-CM | POA: Diagnosis not present

## 2019-11-21 DIAGNOSIS — C44722 Squamous cell carcinoma of skin of right lower limb, including hip: Secondary | ICD-10-CM | POA: Diagnosis not present

## 2019-11-21 DIAGNOSIS — I872 Venous insufficiency (chronic) (peripheral): Secondary | ICD-10-CM | POA: Diagnosis not present

## 2019-11-21 DIAGNOSIS — D485 Neoplasm of uncertain behavior of skin: Secondary | ICD-10-CM | POA: Diagnosis not present

## 2019-11-27 ENCOUNTER — Encounter: Payer: Self-pay | Admitting: Internal Medicine

## 2019-12-05 ENCOUNTER — Ambulatory Visit (INDEPENDENT_AMBULATORY_CARE_PROVIDER_SITE_OTHER): Payer: Medicare Other | Admitting: Emergency Medicine

## 2019-12-05 ENCOUNTER — Encounter: Payer: Self-pay | Admitting: Emergency Medicine

## 2019-12-05 ENCOUNTER — Other Ambulatory Visit: Payer: Self-pay

## 2019-12-05 DIAGNOSIS — J309 Allergic rhinitis, unspecified: Secondary | ICD-10-CM | POA: Diagnosis not present

## 2019-12-05 DIAGNOSIS — J449 Chronic obstructive pulmonary disease, unspecified: Secondary | ICD-10-CM | POA: Diagnosis not present

## 2019-12-05 DIAGNOSIS — R9389 Abnormal findings on diagnostic imaging of other specified body structures: Secondary | ICD-10-CM

## 2019-12-05 NOTE — Patient Instructions (Signed)
We will repeat your CT scan of the chest now. We will consider performing pulmonary function testing at some point going forward to evaluate for COPD Follow Dr. Lamonte Sakai next available after CT scan is performed so that we can review together.

## 2019-12-05 NOTE — Assessment & Plan Note (Signed)
Emphysematous change on imaging but she is asymptomatic, does not have significant respiratory limitation.  She likely will benefit from pulmonary function testing at some point going forward.  We can talk about this at a future visit.  Not on any BD.

## 2019-12-05 NOTE — Assessment & Plan Note (Signed)
She is still dealing with chronic congestion.

## 2019-12-05 NOTE — Assessment & Plan Note (Signed)
6 mm pulmonary nodule and former smoker.  She needs repeat CT now and then follow-up for interval stability for at least 2 years.  She also has some bronchiectatic change and micronodular disease that is mild but evident, suggests chronic mycobacterial disease.  She is asymptomatic and I do not think we need to act on the bronchiectasis or attempted diagnosis [facility] at this time.

## 2019-12-05 NOTE — Addendum Note (Signed)
Addended by: Dierdre Highman on: 12/05/2019 03:39 PM   Modules accepted: Orders

## 2019-12-05 NOTE — Progress Notes (Signed)
Subjective:    Patient ID: Brandy Peterson, female    DOB: [birth year], 86 y.o.   MRN: [id number]  HPI 86 year old former smoker (40 pack years) with a history of breast cancer, GERD, hypertension, hypothyroidism, IBS, Raynaud syndrome, hemochromatosis, allergic rhinitis (formerly on immunotherapy).  She also carries a history of COPD that was made by Dr Linda Hedges. She is not on BD's.  She is referred today for evaluation of an abnormal CT scan of the chest. She has had off and on R flank and back pain for several years, this persisted and the CT was done to evaluate. Her back pain is currently better. Never coughs. Does not have any SOB unless heavy exertion.   CT chest 04/12/2019 reviewed by me, shows some biapical scar, mild centrilobular emphysematous change, right middle lobe and lingular bronchiectasis with some associated tree-in-bud nodularity, and a 6 mm right middle lobe nodule.   Review of Systems As per [facility]  Past Medical History:  Diagnosis Date   Backache, unspecified    Carcinoma of breast ([place])    Right   Complication of anesthesia    was told to never use Sodium Pentothal - mother had reaction. Also had trouble with intubation during mastectomy   COPD (chronic obstructive pulmonary disease) (HCC)    Family history of adverse reaction to anesthesia    GERD (gastroesophageal reflux disease)    Hemochromatosis    History of shingles    Hypertension    Hypothyroidism    IBS (irritable bowel syndrome)    Plantar fasciitis    Pneumonia    Raynaud's syndrome    Spastic colon    hx of    Thyroid disease    Varicose veins      Family History  Problem Relation Age of Onset   Cancer Mother        metastatic cancer (pancreas vs lung)   Hypertension Mother    Osteoarthritis Mother    Hypothyroidism Mother    Varicose Veins Mother    Bleeding Disorder Mother    Heart disease Father        CAD/MI   Hypertension Father    Arthritis Father     Hemochromatosis Father        Possible   Deep vein thrombosis Father    Varicose Veins Father    Heart attack Father    Heart attack Brother    Hemochromatosis Brother    Heart disease Brother    Hyperlipidemia Son    Diabetes Neg Hx      Social History   Socioeconomic History   Marital status: Widowed    Spouse name: Not on file   Number of children: 1   Years of education: 12   Highest education level: High school graduate  Occupational History   Occupation: Retired  Tobacco Use   Smoking status: Former Smoker    Types: Cigarettes    Quit date: 04/13/1988    Years since quitting: 31.6   Smokeless tobacco: Never Used   Tobacco comment: approximate quit date  Vaping Use   Vaping Use: Never used  Substance and Sexual Activity   Alcohol use: Yes    Alcohol/week: 3.0 standard drinks    Types: 3 Glasses of wine per week    Comment: 2oz of wine occasionally   Drug use: No   Sexual activity: Never  Other Topics Concern   Not on file  Social History Writer, system Surveyor, 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 Pt married in 1953 and was widowed in 2002. She now lives alone I-ADLs. Pt has 1 son born 35 and 2 grandchildren.       Pt had a colonoscopy on 05/09/2012 by Dr Oletta Lamas.       Pt reports having a pneumo vaccine.      Right-handed.   4-5 cups caffeine daily.   Lives alone.   Social Determinants of Health   Financial Resource Strain:    Difficulty of Paying Living Expenses: Not on file  Food Insecurity:    Worried About Charity fundraiser in the Last Year: Not on file   [employer] of Food in the Last Year: Not on file  Transportation Needs:    Lack of Transportation (Medical): Not on file   Lack of Transportation (Non-Medical): Not on file  Physical Activity:    Days of Exercise per Week: Not on file   Minutes of Exercise per Session: Not on file  Stress:    Feeling of Stress : Not on file  Social Connections:     Frequency of Communication with Friends and Family: Not on file   Frequency of Social Gatherings with Friends and Family: Not on file   Attends Religious Services: Not on file   Active Member of Clubs or Organizations: Not on file   Attends Archivist Meetings: Not on file   Marital Status: Not on file  Intimate Partner Violence:    Fear of Current or Ex-Partner: Not on file   Emotionally Abused: Not on file   Physically Abused: Not on file   Sexually Abused: Not on file    Has always lived in [place] 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   Allergies  Allergen Reactions   Contrast Media [Iodinated Diagnostic Agents] Hives, Itching and Other (See Comments)    Felt like she was going to faint    Adhesive [Tape] Other (See Comments)    Prefers paper tape   Sulfonamide Derivatives Other (See Comments)    Reaction: unknown      Outpatient Medications Prior to Visit  Medication Sig Dispense Refill   fluticasone (FLONASE) 50 MCG/ACT nasal spray Place 1 spray into both nostrils daily. 48 g 3   levothyroxine (SYNTHROID) 88 MCG tablet Take 1 tablet (88 mcg total) by mouth daily before breakfast. 90 tablet 3   metoprolol succinate (TOPROL-XL) 25 MG 24 hr tablet Take 1 tablet (25 mg total) by mouth daily. 90 tablet 3   omeprazole (PRILOSEC) 20 MG capsule Take 1 capsule (20 mg total) by mouth daily as needed (heartburn). 90 capsule 3   triamterene-hydrochlorothiazide (MAXZIDE-25) 37.5-25 MG tablet Take 1 tablet by mouth daily. 90 tablet 3   traMADol (ULTRAM) 50 MG tablet Take 1 tablet (50 mg total) by mouth every 6 (six) hours as needed. 30 tablet 0   No facility-administered medications prior to visit.         Objective:   Physical Exam Vitals:   12/05/19 1454  BP: 132/70  Pulse: 68  Temp: (!) 97 F (36.1 C)  TempSrc: Temporal  SpO2: 99%  Weight: 108 lb (49 kg)  Height: 5\' 1"  (1.549 m)   Gen: Pleasant, thin, in no distress,  normal  affect  ENT: No lesions,  mouth clear,  oropharynx clear, no postnasal drip  Neck: No JVD, no stridor  Lungs: No use of accessory muscles, no crackles or wheezing on normal respiration, no wheeze on forced expiration  Cardiovascular: RRR,  heart sounds normal, no murmur or gallops, no peripheral edema  Musculoskeletal: No deformities, no cyanosis or clubbing  Neuro: alert, awake, non focal  Skin: Warm, no lesions or rash     Assessment & Plan:  COPD (chronic obstructive pulmonary disease) (HCC) Emphysematous change on imaging but she is asymptomatic, does not have significant respiratory limitation.  She likely will benefit from pulmonary function testing at some point going forward.  We can talk about this at a future visit.  Not on any BD.  Allergic rhinitis She is still dealing with chronic congestion.  Abnormal CT of the chest 6 mm pulmonary nodule and former smoker.  She needs repeat CT now and then follow-up for interval stability for at least 2 years.  She also has some bronchiectatic change and micronodular disease that is mild but evident, suggests chronic mycobacterial disease.  She is asymptomatic and I do not think we need to act on the bronchiectasis or attempted diagnosis MAIC at this time.  Baltazar Apo, MD, PhD 12/05/2019, 3:33 PM  Pulmonary and Critical Care [phone] or if no answer [phone]

## 2019-12-12 DIAGNOSIS — Z23 Encounter for immunization: Secondary | ICD-10-CM | POA: Diagnosis not present

## 2019-12-15 ENCOUNTER — Ambulatory Visit
Admission: RE | Admit: 2019-12-15 | Discharge: 2019-12-15 | Disposition: A | Discharge status: Home / Self Care | Payer: Medicare Other | Source: Ambulatory Visit | Attending: Emergency Medicine | Admitting: Emergency Medicine

## 2019-12-15 DIAGNOSIS — R9389 Abnormal findings on diagnostic imaging of other specified body structures: Secondary | ICD-10-CM

## 2019-12-15 DIAGNOSIS — J479 Bronchiectasis, uncomplicated: Secondary | ICD-10-CM | POA: Diagnosis not present

## 2019-12-15 DIAGNOSIS — I2510 Atherosclerotic heart disease of native coronary artery without angina pectoris: Secondary | ICD-10-CM | POA: Diagnosis not present

## 2019-12-15 DIAGNOSIS — J439 Emphysema, unspecified: Secondary | ICD-10-CM | POA: Diagnosis not present

## 2019-12-15 DIAGNOSIS — I700 Atherosclerosis of aorta: Secondary | ICD-10-CM | POA: Diagnosis not present

## 2020-01-04 ENCOUNTER — Telehealth: Payer: Self-pay | Admitting: Emergency Medicine

## 2020-01-04 NOTE — Telephone Encounter (Signed)
Collene Gobble, MD  01/02/2020 4:49 PM EDT     Please let her know that her CT scan of the chest shows that her pulmonary nodule is stable in size and appearance. We will plan to follow this again with a repeat scan in 1 year. RSB   Called and spoke with pt letting her know the results of CT and she verbalized understanding. Nothing further needed.

## 2020-01-10 ENCOUNTER — Encounter: Payer: Self-pay | Admitting: Emergency Medicine

## 2020-01-10 ENCOUNTER — Ambulatory Visit (INDEPENDENT_AMBULATORY_CARE_PROVIDER_SITE_OTHER): Payer: Medicare Other | Admitting: Emergency Medicine

## 2020-01-10 ENCOUNTER — Other Ambulatory Visit: Payer: Self-pay

## 2020-01-10 DIAGNOSIS — J449 Chronic obstructive pulmonary disease, unspecified: Secondary | ICD-10-CM | POA: Diagnosis not present

## 2020-01-10 DIAGNOSIS — J479 Bronchiectasis, uncomplicated: Secondary | ICD-10-CM

## 2020-01-10 DIAGNOSIS — R911 Solitary pulmonary nodule: Secondary | ICD-10-CM | POA: Diagnosis not present

## 2020-01-10 DIAGNOSIS — J309 Allergic rhinitis, unspecified: Secondary | ICD-10-CM | POA: Diagnosis not present

## 2020-01-10 DIAGNOSIS — J471 Bronchiectasis with (acute) exacerbation: Secondary | ICD-10-CM | POA: Insufficient documentation

## 2020-01-10 NOTE — Assessment & Plan Note (Signed)
At this time.  Not on any bronchodilator therapy.  If her breathing changes and we can consider adding

## 2020-01-10 NOTE — Patient Instructions (Signed)
Your CT scan of the chest shows stable airway inflammatory changes (bronchiectasis) and a stable right middle lobe pulmonary nodule. We will plan to repeat your CT scan without contrast in September 2022 to follow Continue fluticasone nasal spray as you have been taking it. Follow with Dr. Lamonte Sakai in 12 months or sooner if you have any problems.

## 2020-01-10 NOTE — Assessment & Plan Note (Signed)
Fairly stable on fluticasone nasal spray.  Occasionally she has Claritin-d (rarely).  Minimal cough although she does have mucus that gets to her throat.  Plan to continue same

## 2020-01-10 NOTE — Addendum Note (Signed)
Addended by: Gavin Potters R on: 01/10/2020 11:39 AM   Modules accepted: Orders

## 2020-01-10 NOTE — Progress Notes (Signed)
Subjective:    Patient ID: Brandy Peterson, female    DOB: 07-29-1930, 86 y.o.   MRN: [id number]  HPI 86 year old former smoker (40 pack years) with a history of breast cancer, GERD, hypertension, hypothyroidism, IBS, Raynaud syndrome, hemochromatosis, allergic rhinitis (formerly on immunotherapy).  She also carries a history of COPD that was made by Dr Linda Hedges. She is not on BD's.  She is referred today for evaluation of an abnormal CT scan of the chest. She has had off and on R flank and back pain for several years, this persisted and the CT was done to evaluate. Her back pain is currently better. Never coughs. Does not have any SOB unless heavy exertion.   CT chest 04/12/2019 reviewed by me, shows some biapical scar, mild centrilobular emphysematous change, right middle lobe and lingular bronchiectasis with some associated tree-in-bud nodularity, and a 6 mm right middle lobe nodule.  ROV 01/10/20 --Brandy Peterson is an 86 year old former smoker who follows up today for probable COPD that is asymptomatic, allergic rhinitis and an abnormal CT scan of the chest.  She has some mild bronchiectasis and there is a 6 mm right middle lobe nodule seen December 2020.  We repeated a CT scan of her chest on 12/15/2019 which I have reviewed, shows persistent cylindrical right middle lobe, right upper lobe bronchiectasis with some mucus impaction, some background emphysematous change.  Her 6 mm right middle lobe nodule is unchanged. She is having some allergy drainage right now, on flonase, occasionally uses claritin-D.    Review of Systems As per [facility]  Past Medical History:  Diagnosis Date  . Backache, unspecified   . Carcinoma of breast ([place])    Right  . Complication of anesthesia    was told to never use Sodium Pentothal - mother had reaction. Also had trouble with intubation during mastectomy  . COPD (chronic obstructive pulmonary disease) ([place])   . Family history of adverse reaction to anesthesia   . GERD  (gastroesophageal reflux disease)   . Hemochromatosis   . History of shingles   . Hypertension   . Hypothyroidism   . IBS (irritable bowel syndrome)   . Plantar fasciitis   . Pneumonia   . Raynaud's syndrome   . Spastic colon    hx of   . Thyroid disease   . Varicose veins      Family History  Problem Relation Age of Onset  . Cancer Mother        metastatic cancer (pancreas vs lung)  . Hypertension Mother   . Osteoarthritis Mother   . Hypothyroidism Mother   . Varicose Veins Mother   . Bleeding Disorder Mother   . Heart disease Father        CAD/MI  . Hypertension Father   . Arthritis Father   . Hemochromatosis Father        Possible  . Deep vein thrombosis Father   . Varicose Veins Father   . Heart attack Father   . Heart attack Brother   . Hemochromatosis Brother   . Heart disease Brother   . Hyperlipidemia Son   . Diabetes Neg Hx      Social History   Socioeconomic History  . Marital status: Widowed    Spouse name: Not on file  . Number of children: 1  . Years of education: 63  . Highest education level: High school graduate  Occupational History  . Occupation: Retired  Tobacco Use  . Smoking status: Former Smoker  Types: Cigarettes    Quit date: 04/13/1988    Years since quitting: 31.7  . Smokeless tobacco: Never Used  . Tobacco comment: approximate quit date  Vaping Use  . Vaping Use: Never used  Substance and Sexual Activity  . Alcohol use: Yes    Alcohol/week: 3.0 standard drinks    Types: 3 Glasses of wine per week    Comment: 2oz of wine occasionally  . Drug use: No  . Sexual activity: Never  Other Topics Concern  . 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 She now lives alone I-ADLs. Pt has 1 son born 79 and 2 grandchildren.       Pt had a colonoscopy on 05/09/2012 by Dr Oletta Lamas.       Pt reports having a pneumo vaccine.       Right-handed.   4-5 cups caffeine daily.   Lives alone.   Social Determinants of Health   Financial Resource Strain:   . Difficulty of Paying Living Expenses: Not on file  Food Insecurity:   . Worried About Charity fundraiser in the Last Year: Not on file  . Ran Out of Food in the Last Year: Not on file  Transportation Needs:   . Lack of Transportation (Medical): Not on file  . Lack of Transportation (Non-Medical): Not on file  Physical Activity:   . Days of Exercise per Week: Not on file  . Minutes of Exercise per Session: Not on file  Stress:   . Feeling of Stress : Not on file  Social Connections:   . Frequency of Communication with Friends and Family: Not on file  . Frequency of Social Gatherings with Friends and Family: Not on file  . Attends Religious Services: Not on file  . Active Member of Clubs or Organizations: Not on file  . Attends Archivist Meetings: Not on file  . Marital Status: Not on file  Intimate Partner Violence:   . Fear of Current or Ex-Partner: Not on file  . Emotionally Abused: Not on file  . Physically Abused: Not on file  . 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   Allergies  Allergen Reactions  . Contrast Media [Iodinated Diagnostic Agents] Hives, Itching and Other (See Comments)    Felt like she was going to faint   . Adhesive [Tape] Other (See Comments)    Prefers paper tape  . Sulfonamide Derivatives Other (See Comments)    Reaction: unknown      Outpatient Medications Prior to Visit  Medication Sig Dispense Refill  . fluticasone (FLONASE) 50 MCG/ACT nasal spray Place 1 spray into both nostrils daily. 48 g 3  . levothyroxine (SYNTHROID) 88 MCG tablet Take 1 tablet (88 mcg total) by mouth daily before breakfast. 90 tablet 3  . metoprolol succinate (TOPROL-XL) 25 MG 24 hr tablet Take 1 tablet (25 mg total) by mouth daily. 90 tablet 3  . omeprazole (PRILOSEC) 20 MG  capsule Take 1 capsule (20 mg total) by mouth daily as needed (heartburn). 90 capsule 3  . triamterene-hydrochlorothiazide (MAXZIDE-25) 37.5-25 MG tablet Take 1 tablet by mouth daily. 90 tablet 3  . traMADol (ULTRAM) 50 MG tablet Take 1 tablet (50 mg total) by mouth every 6 (six) hours as needed. 30 tablet 0   No facility-administered medications prior  to visit.         Objective:   Physical Exam Vitals:   01/10/20 1115  BP: 140/72  Pulse: (!) 59  Temp: (!) 97.5 F (36.4 C)  TempSrc: Temporal  SpO2: 96%  Weight: 106 lb 6.4 oz (48.3 kg)  Height: 5\' 1"  (1.549 m)   Gen: Pleasant, thin, in no distress,  normal affect  ENT: No lesions,  mouth clear,  oropharynx clear, no postnasal drip  Neck: No JVD, no stridor  Lungs: No use of accessory muscles, no crackles or wheezing on normal respiration, no wheeze on forced expiration  Cardiovascular: RRR, heart sounds normal, no murmur or gallops, no peripheral edema  Musculoskeletal: No deformities, no cyanosis or clubbing  Neuro: alert, awake, non focal  Skin: Warm, no lesions or rash     Assessment & Plan:  COPD (chronic obstructive pulmonary disease) ([place]) At this time.  Not on any bronchodilator therapy.  If her breathing changes and we can consider adding  Allergic rhinitis Fairly stable on fluticasone nasal spray.  Occasionally she has Claritin-d (rarely).  Minimal cough although she does have mucus that gets to her throat.  Plan to continue same  Bronchiectasis without acute exacerbation (HCC) Mild stable bronchiectatic change by CT.  No clinical symptoms, no difficulty managing secretions or recurrent coughing.  She may be colonized with MAI, has not been evaluated.  Based on stability of her imaging and her symptoms I would defer bronchoscopy at this time.  We could reconsider if either change.  Pulmonary nodule 6 mm right middle lobe pulmonary nodule is stable after 9 months.  Plan to repeat her CT in September 2022.   Given her age we may decide to follow conservatively even if it is enlarging.  Will discuss in a year after her CT is done.  Baltazar Apo, MD, PhD 01/10/2020, 11:36 AM [place] [facility] [phone] or if no answer [phone]

## 2020-01-10 NOTE — Assessment & Plan Note (Signed)
6 mm right middle lobe pulmonary nodule is stable after 9 months.  Plan to repeat her CT in September 2022.  Given her age we may decide to follow conservatively even if it is enlarging.  Will discuss in a year after her CT is done.

## 2020-01-10 NOTE — Assessment & Plan Note (Signed)
Mild stable bronchiectatic change by CT.  No clinical symptoms, no difficulty managing secretions or recurrent coughing.  She may be colonized with MAI, has not been evaluated.  Based on stability of her imaging and her symptoms I would defer bronchoscopy at this time.  We could reconsider if either change.

## 2020-02-05 DIAGNOSIS — Z23 Encounter for immunization: Secondary | ICD-10-CM | POA: Diagnosis not present

## 2020-02-06 ENCOUNTER — Ambulatory Visit: Payer: Medicare Other | Admitting: Internal Medicine

## 2020-04-22 ENCOUNTER — Other Ambulatory Visit: Payer: Self-pay

## 2020-04-24 ENCOUNTER — Inpatient Hospital Stay: Payer: Medicare Other

## 2020-04-24 ENCOUNTER — Inpatient Hospital Stay: Payer: Medicare Other | Attending: Hematology | Admitting: Hematology

## 2020-04-24 ENCOUNTER — Other Ambulatory Visit: Payer: Self-pay

## 2020-04-24 DIAGNOSIS — Z9011 Acquired absence of right breast and nipple: Secondary | ICD-10-CM | POA: Insufficient documentation

## 2020-04-24 DIAGNOSIS — Z809 Family history of malignant neoplasm, unspecified: Secondary | ICD-10-CM | POA: Insufficient documentation

## 2020-04-24 DIAGNOSIS — Z853 Personal history of malignant neoplasm of breast: Secondary | ICD-10-CM | POA: Diagnosis not present

## 2020-04-24 DIAGNOSIS — Z8349 Family history of other endocrine, nutritional and metabolic diseases: Secondary | ICD-10-CM | POA: Insufficient documentation

## 2020-04-24 DIAGNOSIS — Z90710 Acquired absence of both cervix and uterus: Secondary | ICD-10-CM | POA: Insufficient documentation

## 2020-04-24 DIAGNOSIS — Z87891 Personal history of nicotine dependence: Secondary | ICD-10-CM | POA: Diagnosis not present

## 2020-04-24 LAB — CBC WITH DIFFERENTIAL (CANCER CENTER ONLY)
Abs Immature Granulocytes: 0.05 10*3/uL (ref 0.00–0.07)
Basophils Absolute: 0 10*3/uL (ref 0.0–0.1)
Basophils Relative: 1 %
Eosinophils Absolute: 0.2 10*3/uL (ref 0.0–0.5)
Eosinophils Relative: 2 %
HCT: 41.8 % (ref 36.0–46.0)
Hemoglobin: 14.8 g/dL (ref 12.0–15.0)
Immature Granulocytes: 1 %
Lymphocytes Relative: 18 %
Lymphs Abs: 1.4 10*3/uL (ref 0.7–4.0)
MCH: 32.7 pg (ref 26.0–34.0)
MCHC: 35.4 g/dL (ref 30.0–36.0)
MCV: 92.3 fL (ref 80.0–100.0)
Monocytes Absolute: 0.6 10*3/uL (ref 0.1–1.0)
Monocytes Relative: 7 %
Neutro Abs: 5.5 10*3/uL (ref 1.7–7.7)
Neutrophils Relative %: 71 %
Platelet Count: 246 10*3/uL (ref 150–400)
RBC: 4.53 MIL/uL (ref 3.87–5.11)
RDW: 12.5 % (ref 11.5–15.5)
WBC Count: 7.7 10*3/uL (ref 4.0–10.5)
nRBC: 0 % (ref 0.0–0.2)

## 2020-04-24 LAB — CMP (CANCER CENTER ONLY)
ALT: 15 U/L (ref 0–44)
AST: 20 U/L (ref 15–41)
Albumin: 3.6 g/dL (ref 3.5–5.0)
Alkaline Phosphatase: 88 U/L (ref 38–126)
Anion gap: 5 (ref 5–15)
BUN: 15 mg/dL (ref 8–23)
CO2: 30 mmol/L (ref 22–32)
Calcium: 8.9 mg/dL (ref 8.9–10.3)
Chloride: 99 mmol/L (ref 98–111)
Creatinine: 0.77 mg/dL (ref 0.44–1.00)
GFR, Estimated: >60 mL/min (ref >60)
Glucose, Bld: 96 mg/dL (ref 70–99)
Potassium: 4.3 mmol/L (ref 3.5–5.1)
Sodium: 134 mmol/L — ABNORMAL LOW (ref 135–145)
Total Bilirubin: 0.7 mg/dL (ref 0.3–1.2)
Total Protein: 6.7 g/dL (ref 6.5–8.1)

## 2020-04-24 LAB — RETICULOCYTES
Immature Retic Fract: 4.6 % (ref 2.3–15.9)
RBC.: 4.49 MIL/uL (ref 3.87–5.11)
Retic Count, Absolute: 71.4 10*3/uL (ref 19.0–186.0)
Retic Ct Pct: 1.6 % (ref 0.4–3.1)

## 2020-04-24 LAB — FERRITIN: Ferritin: 98 ng/mL (ref 11–307)

## 2020-04-24 NOTE — Progress Notes (Signed)
Marland Kitchen    HEMATOLOGY/ONCOLOGY CLINIC NOTE  Date of Service: .09/25/2016  Patient Care Team: Corwin Levins, MD as PCP - General (Internal Medicine) Reece Packer, MD (Hematology and Oncology) Cherlyn Roberts, MD (Dermatology) Carman Ching, MD (Inactive) (Gastroenterology) Marcene Corning, MD as Consulting Physician (Orthopedic Surgery)  CHIEF COMPLAINTS/PURPOSE OF CONSULTATION:  F/u for Hemochromatosis  HISTORY OF PRESENTING ILLNESS:   Brandy Peterson is a wonderful 87 y.o. female who has been referred to [place] by Dr Darrold Span for ongoing management of hemochromatosis. She notes that she has a family history of hemochromatosis and was diagnosed in the 22s. She has required maintenance therapy to phlebotomies roughly every 6 months to try to keep her ferritin levels below 50. She notes no known history of liver disease or heart disease. She reports that her ferritin levels even on diagnosis but not more than 1000. She does not recollect what her genetic mutation studies showed. Patient today has no acute new concerns. No new skin pigmentation changes. No new fatigue. No abdominal pain or distention. No shortness of breath or DOE.  INTERVAL HISTORY:  Brandy Peterson is here for f/u of her hemochromatosis. The patient's last visit with [place] was on 04/24/2019. The pt reports that she is doing well overall.  The pt reports no acute issues today. Lab results today (04/24/20) of CBC w/diff and CMP is as follows: all values are WNL  04/24/2020 Ferritin at 98 Last therapeutic Phlebotomy on 10/23/19  On review of systems, pt reports no acute new symptoms.   MEDICAL HISTORY:  Past Medical History:  Diagnosis Date  . Backache, unspecified   . Carcinoma of breast (HCC)    Right  . Complication of anesthesia    was told to never use Sodium Pentothal - mother had reaction. Also had trouble with intubation during mastectomy  . COPD (chronic obstructive pulmonary disease) (HCC)   . Family history  of adverse reaction to anesthesia   . GERD (gastroesophageal reflux disease)   . Hemochromatosis   . History of shingles   . Hypertension   . Hypothyroidism   . IBS (irritable bowel syndrome)   . Plantar fasciitis   . Pneumonia   . Raynaud's syndrome   . Spastic colon    hx of   . Thyroid disease   . Varicose veins     SURGICAL HISTORY: Past Surgical History:  Procedure Laterality Date  . ABDOMINAL HYSTERECTOMY  1979   PARTIAL  . APPENDECTOMY    . BONE SPUR  1970   BOTH FEET  . CATARACT EXTRACTION  10/2012 and 12/2012   bilateral with lens implants  . COLONOSCOPY    . KYPHOPLASTY N/A 10/19/2014   Procedure: Lumbar 1 kyphoplasty;  Surgeon: Shirlean Kelly, MD;  Location: [facility];  Service: Neurosurgery;  Laterality: N/A;  . LUMBAR LAMINECTOMY  1991   L5-S1  . MASTECTOMY  1994   RIGHT  . MASTECTOMY  1994   Right    SOCIAL HISTORY: Social History   Socioeconomic History  . Marital status: Widowed    Spouse name: Not on file  . Number of children: 1  . Years of education: 69  . Highest education level: High school graduate  Occupational History  . Occupation: Retired  Tobacco Use  . Smoking status: Former Smoker    Types: Cigarettes    Quit date: 04/13/1988    Years since quitting: 32.0  . Smokeless tobacco: Never Used  . Tobacco comment: approximate quit date  Vaping  Use  . Vaping Use: Never used  Substance and Sexual Activity  . Alcohol use: Yes    Alcohol/week: 3.0 standard drinks    Types: 3 Glasses of wine per week    Comment: 2oz of wine occasionally  . Drug use: No  . Sexual activity: Never  Other Topics Concern  . 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 She now lives alone I-ADLs. Pt has 1 son born 23 and 2 grandchildren.       Pt had a colonoscopy on 05/09/2012 by Dr Oletta Lamas.       Pt reports having a pneumo vaccine.      Right-handed.    4-5 cups caffeine daily.   Lives alone.   Social Determinants of Health   Financial Resource Strain: Not on file  Food Insecurity: Not on file  Transportation Needs: Not on file  Physical Activity: Not on file  Stress: Not on file  Social Connections: Not on file  Intimate Partner Violence: Not on file    FAMILY HISTORY: Family History  Problem Relation Age of Onset  . Cancer Mother        metastatic cancer (pancreas vs lung)  . Hypertension Mother   . Osteoarthritis Mother   . Hypothyroidism Mother   . Varicose Veins Mother   . Bleeding Disorder Mother   . Heart disease Father        CAD/MI  . Hypertension Father   . Arthritis Father   . Hemochromatosis Father        Possible  . Deep vein thrombosis Father   . Varicose Veins Father   . Heart attack Father   . Heart attack Brother   . Hemochromatosis Brother   . Heart disease Brother   . Hyperlipidemia Son   . Diabetes Neg Hx     ALLERGIES:  is allergic to contrast media [iodinated diagnostic agents], adhesive [tape], and sulfonamide derivatives.  MEDICATIONS:  Current Outpatient Medications  Medication Sig Dispense Refill  . fluticasone (FLONASE) 50 MCG/ACT nasal spray Place 1 spray into both nostrils daily. 48 g 3  . levothyroxine (SYNTHROID) 88 MCG tablet Take 1 tablet (88 mcg total) by mouth daily before breakfast. 90 tablet 3  . metoprolol succinate (TOPROL-XL) 25 MG 24 hr tablet Take 1 tablet (25 mg total) by mouth daily. 90 tablet 3  . omeprazole (PRILOSEC) 20 MG capsule Take 1 capsule (20 mg total) by mouth daily as needed (heartburn). 90 capsule 3  . traMADol (ULTRAM) 50 MG tablet Take 1 tablet (50 mg total) by mouth every 6 (six) hours as needed. 30 tablet 0  . triamterene-hydrochlorothiazide (MAXZIDE-25) 37.5-25 MG tablet Take 1 tablet by mouth daily. 90 tablet 3   No current facility-administered medications for this visit.    REVIEW OF SYSTEMS:   A 10+ POINT REVIEW OF SYSTEMS WAS OBTAINED  including neurology, dermatology, psychiatry, cardiac, respiratory, lymph, extremities, GI, GU, Musculoskeletal, constitutional, breasts, reproductive, HEENT.  All pertinent positives are noted in the HPI.  All others are negative.   PHYSICAL EXAMINATION: ECOG PERFORMANCE STATUS: 1 - Symptomatic but completely ambulatory . Vitals:   04/24/20 1147  BP: (!) 146/61  Pulse: 63  Resp: 15  Temp: 98.2 F (36.8 C)  SpO2: 100%   Filed Weights   04/24/20 1147  Weight: 108 lb 6.4 oz (49.2 kg)   .Body mass index is 20.48 kg/m.  NAD  GENERAL:alert, in no acute distress and comfortable SKIN: no acute rashes, no significant lesions EYES: conjunctiva are pink and non-injected, sclera anicteric OROPHARYNX: MMM, no exudates, no oropharyngeal erythema or ulceration NECK: supple, no JVD LYMPH:  no palpable lymphadenopathy in the cervical, axillary or inguinal regions LUNGS: clear to auscultation b/l with normal respiratory effort HEART: regular rate & rhythm ABDOMEN:  normoactive bowel sounds , non tender, not distended. No palpable hepatosplenomegaly.  Extremity: no pedal edema PSYCH: alert & oriented x 3 with fluent speech NEURO: no focal motor/sensory deficits  LABORATORY DATA:  I have reviewed the data as listed  . CBC Latest Ref Rng & Units 10/17/2019 04/24/2019 03/30/2018  WBC 4.0 - 10.5 K/uL 6.4 8.0 5.8  Hemoglobin 12.0 - 15.0 g/dL 14.2 15.2(H) 14.3  Hematocrit 36.0 - 46.0 % 40.4 43.3 41.0  Platelets 150 - 400 K/uL 216 265 261    . CMP Latest Ref Rng & Units 10/17/2019 04/24/2019 03/30/2018  Glucose 70 - 99 mg/dL 87 97 80  BUN 8 - 23 mg/dL 16 18 17   Creatinine 0.44 - 1.00 mg/dL 0.87 0.80 0.79  Sodium 135 - 145 mmol/L 139 137 137  Potassium 3.5 - 5.1 mmol/L 4.0 4.7 4.3  Chloride 98 - 111 mmol/L 104 100 102  CO2 22 - 32 mmol/L 26 27 27   Calcium 8.9 - 10.3 mg/dL 8.7(L) 8.9 9.0  Total Protein 6.5 - 8.1 g/dL 6.5 7.0 6.5  Total Bilirubin 0.3 - 1.2 mg/dL 0.7 0.6 0.6  Alkaline Phos 38 -  126 U/L 82 91 91  AST 15 - 41 U/L 20 23 23   ALT 0 - 44 U/L 16 19 19    . Lab Results  Component Value Date   IRON 149 (H) 03/23/2017   TIBC 255 03/23/2017   IRONPCTSAT 58 (H) 03/23/2017   (Iron and TIBC)  Lab Results  Component Value Date   FERRITIN 119 10/17/2019    Hemochromatosis DNA-PCR(c282y,h63d)  Order: [id number]  Status:  Final result Visible to patient:  Yes (MyChart) Next appt:  10/06/2017 at 10:45 AM in Oncology [facility] Lab 1) Dx:  Hereditary hemochromatosis ([place])  Component 3wk ago  Hemochromatosis Gene Comment   Comment: Result: AFFECTED  Two copies of the same mutation (C282Y and C282Y) identified         RADIOGRAPHIC STUDIES: I have personally reviewed the radiological images as listed and agreed with the findings in the report. No results found.  ASSESSMENT & PLAN:   87 y.o. female with  #1 history of hemochromatosis . Homozygous C282Y mutation  Patient notes that she has never had a ferritin level more than 1000. Family history positive. Her goal has been to keep her ferritin levels below 50. She has roughly needed a therapeutic phlebotomy every 6 months or so.  PLAN:  Labs today reviewed ferritin 98 -will schedule for therapeutic phlebotomy now and then q73months as needed for ferritin >50.  #2 T1N0 node negative right breast cancer 1994, post mastectomy with nodes, 5 years of adjuvant tamoxifen. No clinical evidence of breast cancer recurrence at this time . Plan  - continue yearly mammograms with primary care physician  -She has required 3D tomo mammography due to extremely dense breast tissue.   FOLLOW UP: Therapeutic phlebotomy on 05/07/2020 and then q6 months x 4 with labs MD visit in 12 months   The total time spent in the appt was 20 minutes and more than 50% was on counseling and direct patient cares.  All of the patient's  questions were answered with apparent satisfaction. The patient knows to call the clinic with any  problems, questions or concerns.    Sullivan Lone MD [place] AAHIVMS [facility] Hematology/Oncology Physician [facility]  (Office):       [phone] (Work cell):  [phone] (Fax):           [phone]  I, Yevette Edwards, am acting as a scribe for Dr. Sullivan Lone.   .I have reviewed the above documentation for accuracy and completeness, and I agree with the above. Brunetta Genera MD

## 2020-04-26 DIAGNOSIS — Z1231 Encounter for screening mammogram for malignant neoplasm of breast: Secondary | ICD-10-CM | POA: Diagnosis not present

## 2020-04-27 ENCOUNTER — Encounter: Payer: Self-pay | Admitting: Hematology

## 2020-04-29 ENCOUNTER — Other Ambulatory Visit: Payer: Self-pay | Admitting: *Deleted

## 2020-04-30 ENCOUNTER — Telehealth: Payer: Self-pay | Admitting: Hematology

## 2020-04-30 NOTE — Telephone Encounter (Signed)
Scheduled appt per 1/17 schmsg - pt is aware of appt date and time

## 2020-05-07 ENCOUNTER — Inpatient Hospital Stay: Payer: Medicare Other

## 2020-05-07 ENCOUNTER — Other Ambulatory Visit: Payer: Self-pay

## 2020-05-07 DIAGNOSIS — Z809 Family history of malignant neoplasm, unspecified: Secondary | ICD-10-CM | POA: Diagnosis not present

## 2020-05-07 DIAGNOSIS — Z853 Personal history of malignant neoplasm of breast: Secondary | ICD-10-CM | POA: Diagnosis not present

## 2020-05-07 DIAGNOSIS — Z87891 Personal history of nicotine dependence: Secondary | ICD-10-CM | POA: Diagnosis not present

## 2020-05-07 DIAGNOSIS — Z90710 Acquired absence of both cervix and uterus: Secondary | ICD-10-CM | POA: Diagnosis not present

## 2020-05-07 DIAGNOSIS — Z9011 Acquired absence of right breast and nipple: Secondary | ICD-10-CM | POA: Diagnosis not present

## 2020-05-07 NOTE — Progress Notes (Signed)
Brandy Peterson presents today for phlebotomy per MD orders. Phlebotomy procedure started at 1420 and ended at 1426 531 grams removed. Patient observed for 30 minutes after procedure without any incident. Patient tolerated procedure well. IV needle removed intact. Food and fluids provided.

## 2020-05-07 NOTE — Patient Instructions (Signed)
Therapeutic Phlebotomy Therapeutic phlebotomy is the planned removal of blood from a person's body for the purpose of treating a medical condition. The procedure is similar to donating blood. Usually, about a pint (470 mL, or 0.47 L) of blood is removed. The average adult has 9-12 pints (4.3-5.7 L) of blood in the body. Therapeutic phlebotomy may be used to treat the following medical conditions:  Hemochromatosis. This is a condition in which the blood contains too much iron.  Polycythemia vera. This is a condition in which the blood contains too many red blood cells.  Porphyria cutanea tarda. This is a disease in which an important part of hemoglobin is not made properly. It results in the buildup of abnormal amounts of porphyrins in the body.  Sickle cell disease. This is a condition in which the red blood cells form an abnormal crescent shape rather than a round shape. Tell a health care provider about:  Any allergies you have.  All medicines you are taking, including vitamins, herbs, eye drops, creams, and over-the-counter medicines.  Any problems you or family members have had with anesthetic medicines.  Any blood disorders you have.  Any surgeries you have had.  Any medical conditions you have.  Whether you are pregnant or may be pregnant. What are the risks? Generally, this is a safe procedure. However, problems may occur, including:  Nausea or light-headedness.  Low blood pressure (hypotension).  Soreness, bleeding, swelling, or bruising at the needle insertion site.  Infection. What happens before the procedure?  Follow instructions from your health care provider about eating or drinking restrictions.  Ask your health care provider about: ? Changing or stopping your regular medicines. This is especially important if you are taking diabetes medicines or blood thinners (anticoagulants). ? Taking medicines such as aspirin and ibuprofen. These medicines can thin your  blood. Do not take these medicines unless your health care provider tells you to take them. ? Taking over-the-counter medicines, vitamins, herbs, and supplements.  Wear clothing with sleeves that can be raised above the elbow.  Plan to have someone take you home from the hospital or clinic.  You may have a blood sample taken.  Your blood pressure, pulse rate, and breathing rate will be measured. What happens during the procedure?  To lower your risk of infection: ? Your health care team will wash or sanitize their hands. ? Your skin will be cleaned with an antiseptic.  You may be given a medicine to numb the area (local anesthetic).  A tourniquet will be placed on your arm.  A needle will be inserted into one of your veins.  Tubing and a collection bag will be attached to that needle.  Blood will flow through the needle and tubing into the collection bag.  The collection bag will be placed lower than your arm to allow gravity to help the flow of blood into the bag.  You may be asked to open and close your hand slowly and continually during the entire collection.  After the specified amount of blood has been removed from your body, the collection bag and tubing will be clamped.  The needle will be removed from your vein.  Pressure will be held on the site of the needle insertion to stop the bleeding.  A bandage (dressing) will be placed over the needle insertion site. The procedure may vary among health care providers and hospitals.   What happens after the procedure?  Your blood pressure, pulse rate, and breathing rate will   be measured after the procedure.  You will be encouraged to drink fluids.  Your recovery will be assessed and monitored.  You can return to your normal activities as told by your health care provider. Summary  Therapeutic phlebotomy is the planned removal of blood from a person's body for the purpose of treating a medical condition.  Therapeutic  phlebotomy may be used to treat hemochromatosis, polycythemia vera, porphyria cutanea tarda, or sickle cell disease.  In the procedure, a needle is inserted and about a pint (470 mL, or 0.47 L) of blood is removed. The average adult has 9-12 pints (4.3-5.7 L) of blood in the body.  This is generally a safe procedure, but it can sometimes cause problems such as nausea, light-headedness, or low blood pressure (hypotension). This information is not intended to replace advice given to you by your health care provider. Make sure you discuss any questions you have with your health care provider. Document Revised: 04/15/2017 Document Reviewed: 04/15/2017 Elsevier Patient Education  2021 Elsevier Inc.  

## 2020-05-23 DIAGNOSIS — Z85828 Personal history of other malignant neoplasm of skin: Secondary | ICD-10-CM | POA: Diagnosis not present

## 2020-05-23 DIAGNOSIS — L738 Other specified follicular disorders: Secondary | ICD-10-CM | POA: Diagnosis not present

## 2020-05-23 DIAGNOSIS — L570 Actinic keratosis: Secondary | ICD-10-CM | POA: Diagnosis not present

## 2020-05-23 DIAGNOSIS — L821 Other seborrheic keratosis: Secondary | ICD-10-CM | POA: Diagnosis not present

## 2020-07-09 ENCOUNTER — Other Ambulatory Visit: Payer: Self-pay

## 2020-07-10 ENCOUNTER — Encounter: Payer: Self-pay | Admitting: Internal Medicine

## 2020-07-10 ENCOUNTER — Other Ambulatory Visit: Payer: Self-pay

## 2020-07-10 ENCOUNTER — Ambulatory Visit (INDEPENDENT_AMBULATORY_CARE_PROVIDER_SITE_OTHER): Payer: Medicare Other | Admitting: Internal Medicine

## 2020-07-10 VITALS — BP 180/70 | HR 80 | Wt 110.9 lb

## 2020-07-10 DIAGNOSIS — E538 Deficiency of other specified B group vitamins: Secondary | ICD-10-CM | POA: Diagnosis not present

## 2020-07-10 DIAGNOSIS — R32 Unspecified urinary incontinence: Secondary | ICD-10-CM

## 2020-07-10 DIAGNOSIS — I7300 Raynaud's syndrome without gangrene: Secondary | ICD-10-CM | POA: Diagnosis not present

## 2020-07-10 DIAGNOSIS — E039 Hypothyroidism, unspecified: Secondary | ICD-10-CM | POA: Diagnosis not present

## 2020-07-10 DIAGNOSIS — K219 Gastro-esophageal reflux disease without esophagitis: Secondary | ICD-10-CM

## 2020-07-10 DIAGNOSIS — R739 Hyperglycemia, unspecified: Secondary | ICD-10-CM

## 2020-07-10 DIAGNOSIS — R109 Unspecified abdominal pain: Secondary | ICD-10-CM | POA: Diagnosis not present

## 2020-07-10 DIAGNOSIS — E559 Vitamin D deficiency, unspecified: Secondary | ICD-10-CM

## 2020-07-10 DIAGNOSIS — F411 Generalized anxiety disorder: Secondary | ICD-10-CM | POA: Diagnosis not present

## 2020-07-10 DIAGNOSIS — I10 Essential (primary) hypertension: Secondary | ICD-10-CM

## 2020-07-10 LAB — CBC WITH DIFFERENTIAL/PLATELET
Basophils Absolute: 0.1 10*3/uL (ref 0.0–0.1)
Basophils Relative: 1 % (ref 0.0–3.0)
Eosinophils Absolute: 0.2 10*3/uL (ref 0.0–0.7)
Eosinophils Relative: 2.6 % (ref 0.0–5.0)
HCT: 44.6 % (ref 36.0–46.0)
Hemoglobin: 15.4 g/dL — ABNORMAL HIGH (ref 12.0–15.0)
Lymphocytes Relative: 13.4 % (ref 12.0–46.0)
Lymphs Abs: 1.1 10*3/uL (ref 0.7–4.0)
MCHC: 34.5 g/dL (ref 30.0–36.0)
MCV: 96.1 fl (ref 78.0–100.0)
Monocytes Absolute: 0.6 10*3/uL (ref 0.1–1.0)
Monocytes Relative: 7.5 % (ref 3.0–12.0)
Neutro Abs: 6.3 10*3/uL (ref 1.4–7.7)
Neutrophils Relative %: 75.5 % (ref 43.0–77.0)
Platelets: 264 10*3/uL (ref 150.0–400.0)
RBC: 4.64 Mil/uL (ref 3.87–5.11)
RDW: 12.8 % (ref 11.5–15.5)
WBC: 8.4 10*3/uL (ref 4.0–10.5)

## 2020-07-10 LAB — BASIC METABOLIC PANEL
BUN: 19 mg/dL (ref 6–23)
CO2: 31 mEq/L (ref 19–32)
Calcium: 9.8 mg/dL (ref 8.4–10.5)
Chloride: 99 mEq/L (ref 96–112)
Creatinine, Ser: 0.77 mg/dL (ref 0.40–1.20)
GFR: 69.31 mL/min (ref >60.00)
Glucose, Bld: 90 mg/dL (ref 70–99)
Potassium: 4.2 mEq/L (ref 3.5–5.1)
Sodium: 137 mEq/L (ref 135–145)

## 2020-07-10 LAB — URINALYSIS, ROUTINE W REFLEX MICROSCOPIC
Bilirubin Urine: NEGATIVE
Hgb urine dipstick: NEGATIVE
Ketones, ur: NEGATIVE
Leukocytes,Ua: NEGATIVE
Nitrite: NEGATIVE
RBC / HPF: NONE SEEN (ref 0–?)
Specific Gravity, Urine: 1.01 (ref 1.000–1.030)
Total Protein, Urine: NEGATIVE
Urine Glucose: NEGATIVE
Urobilinogen, UA: 0.2 (ref 0.0–1.0)
WBC, UA: NONE SEEN (ref 0–?)
pH: 6 (ref 5.0–8.0)

## 2020-07-10 LAB — HEPATIC FUNCTION PANEL
ALT: 15 U/L (ref 0–35)
AST: 23 U/L (ref 0–37)
Albumin: 4.3 g/dL (ref 3.5–5.2)
Alkaline Phosphatase: 87 U/L (ref 39–117)
Bilirubin, Direct: 0.1 mg/dL (ref 0.0–0.3)
Total Bilirubin: 0.6 mg/dL (ref 0.2–1.2)
Total Protein: 7.3 g/dL (ref 6.0–8.3)

## 2020-07-10 LAB — TSH: TSH: 1.93 u[IU]/mL (ref 0.35–4.50)

## 2020-07-10 LAB — SEDIMENTATION RATE: Sed Rate: 11 mm/hr (ref 0–30)

## 2020-07-10 LAB — VITAMIN B12: Vitamin B-12: 502 pg/mL (ref 211–911)

## 2020-07-10 LAB — LIPID PANEL
Cholesterol: 221 mg/dL — ABNORMAL HIGH (ref 0–200)
HDL: 83.4 mg/dL (ref >39.00)
LDL Cholesterol: 123 mg/dL — ABNORMAL HIGH (ref 0–99)
NonHDL: 137.55
Total CHOL/HDL Ratio: 3
Triglycerides: 75 mg/dL (ref 0.0–149.0)
VLDL: 15 mg/dL (ref 0.0–40.0)

## 2020-07-10 LAB — VITAMIN D 25 HYDROXY (VIT D DEFICIENCY, FRACTURES): VITD: 45.16 ng/mL (ref 30.00–100.00)

## 2020-07-10 LAB — T4, FREE: Free T4: 1.57 ng/dL (ref 0.60–1.60)

## 2020-07-10 LAB — HEMOGLOBIN A1C: Hgb A1c MFr Bld: 4.8 % (ref 4.6–6.5)

## 2020-07-10 MED ORDER — LEVOTHYROXINE SODIUM 88 MCG PO TABS: 88.0000 ug | ORAL_TABLET | Freq: Every day | ORAL | 3 refills | Status: DC

## 2020-07-10 MED ORDER — TRIAMTERENE-HCTZ 37.5-25 MG PO TABS
1.0000 | ORAL_TABLET | Freq: Every day | ORAL | 3 refills | Status: DC
Start: 2020-07-10 — End: 2021-06-25

## 2020-07-10 MED ORDER — METOPROLOL SUCCINATE ER 25 MG PO TB24: 25.0000 mg | ORAL_TABLET | Freq: Every day | ORAL | 3 refills | Status: DC

## 2020-07-10 MED ORDER — DULOXETINE HCL 30 MG PO CPEP: 30.0000 mg | ORAL_CAPSULE | Freq: Every day | ORAL | 3 refills | Status: DC

## 2020-07-10 MED ORDER — FLUTICASONE PROPIONATE 50 MCG/ACT NA SUSP: 1.0000 | Freq: Every day | NASAL | 3 refills | Status: DC

## 2020-07-10 MED ORDER — OMEPRAZOLE 40 MG PO CPDR: 40.0000 mg | DELAYED_RELEASE_CAPSULE | Freq: Every day | ORAL | 3 refills | Status: DC

## 2020-07-10 NOTE — Progress Notes (Signed)
Patient ID: Brandy Peterson, female   DOB: 1930-06-26, 87 y.o.   MRN: [id number]         Chief Complaint:: yearly visit and  Constipation (Per patient sometimes bowel leakage no control or constipation)         HPI:  Brandy Peterson is a 87 y.o. female here for above; up to date with preventive referrals and immunizations.  Pt laments her age and how it seems no one is interested in her chronic issues including mild bowel and urinary incontinence issues, occasional mild reflux, "stomach growing" for 3 years for unclear reasons and she is fearful of some terrible problem and "no one" wants to give her colonoscopy or cologuard, and despite miralax changed recently to metamucil. Also has had worsening low back pain, gabapentin seemed to help but made her eyes do funny things, she is ok with cymbalta.  Tramadol has not helped.  Has also seen gyn.  Also has recurring raynauds and now worried about onset lupus or other rheum disease she has been reading about but has no joint pain or other symptoms.  Denies hyper or hypo thyroid symptoms such as voice, skin or hair change but desperately wants her thyroid checked.  BP at home has been < 150 sbp, just kind of "worked up" today.  Pt denies chest pain, increased sob or doe, wheezing, orthopnea, PND, increased LE swelling, palpitations, dizziness or syncope.   Pt denies polydipsia, polyuria, Asks for "complete blood work up today including the lupus test".     Wt Readings from Last 3 Encounters:  07/10/20 110 lb 14.4 oz (50.3 kg)  04/24/20 108 lb 6.4 oz (49.2 kg)  01/10/20 106 lb 6.4 oz (48.3 kg)   BP Readings from Last 3 Encounters:  07/10/20 (!) 180/70  05/07/20 137/82  04/24/20 (!) 146/61   Immunization History  Administered Date(s) Administered  . Fluad Quad(high Dose 65+) 12/14/2018, 12/12/2019  . H1N1 03/21/2008  . Influenza Split 12/29/2011  . Influenza Whole 12/24/2009  . Influenza, High Dose Seasonal PF 01/21/2016, 01/21/2017, 01/07/2018  .  Influenza,inj,Quad PF,6+ Mos 01/16/2014  . Influenza-Unspecified 01/02/2013, 03/11/2015, 12/14/2018  . Moderna Sars-Covid-2 Vaccination 05/04/2019, 06/09/2019, 02/05/2020  . Pneumococcal Conjugate-13 05/02/2013  . Pneumococcal Polysaccharide-23 01/25/2002  . Tdap 04/24/2005, 04/05/2015  There are no preventive care reminders to display for this patient.    Past Medical History:  Diagnosis Date  . Backache, unspecified   . Carcinoma of breast ([place])    Right  . Complication of anesthesia    was told to never use Sodium Pentothal - mother had reaction. Also had trouble with intubation during mastectomy  . COPD (chronic obstructive pulmonary disease) ([place])   . Family history of adverse reaction to anesthesia   . GERD (gastroesophageal reflux disease)   . Hemochromatosis   . History of shingles   . Hypertension   . Hypothyroidism   . IBS (irritable bowel syndrome)   . Plantar fasciitis   . Pneumonia   . Raynaud's syndrome   . Spastic colon    hx of   . Thyroid disease   . Varicose veins    Past Surgical History:  Procedure Laterality Date  . ABDOMINAL HYSTERECTOMY  1979   PARTIAL  . APPENDECTOMY    . BONE SPUR  1970   BOTH FEET  . CATARACT EXTRACTION  10/2012 and 12/2012   bilateral with lens implants  . COLONOSCOPY    . KYPHOPLASTY N/A 10/19/2014   Procedure: Lumbar 1 kyphoplasty;  Surgeon: Jovita Gamma, MD;  Location: [facility];  Service: Neurosurgery;  Laterality: N/A;  . LUMBAR LAMINECTOMY  1991   L5-S1  . MASTECTOMY  1994   RIGHT  . MASTECTOMY  1994   Right    reports that she quit smoking about 32 years ago. Her smoking use included cigarettes. She has never used smokeless tobacco. She reports current alcohol use of about 3.0 standard drinks of alcohol per week. She reports that she does not use drugs. family history includes Arthritis in her father; Bleeding Disorder in her mother; Cancer in her mother; Deep vein thrombosis in her father; Heart attack in her  brother and father; Heart disease in her brother and father; Hemochromatosis in her brother and father; Hyperlipidemia in her son; Hypertension in her father and mother; Hypothyroidism in her mother; Osteoarthritis in her mother; Varicose Veins in her father and mother. Allergies  Allergen Reactions  . Contrast Media [Iodinated Diagnostic Agents] Hives, Itching and Other (See Comments)    Felt like she was going to faint   . Adhesive [Tape] Other (See Comments)    Prefers paper tape  . Sulfonamide Derivatives Other (See Comments)    Reaction: unknown    No current outpatient medications on file prior to visit.   No current facility-administered medications on file prior to visit.        ROS:  All others reviewed and negative.  Objective        PE:  BP (!) 180/70 (BP Location: Right Arm, Patient Position: Sitting)   Pulse 80   Wt 110 lb 14.4 oz (50.3 kg)   SpO2 98%   BMI 20.95 kg/m                 Constitutional: Pt appears in NAD               HENT: Head: NCAT.                Right Ear: External ear normal.                 Left Ear: External ear normal.                Eyes: . Pupils are equal, round, and reactive to light. Conjunctivae and EOM are normal               Nose: without d/c or deformity               Neck: Neck supple. Gross normal ROM               Cardiovascular: Normal rate and regular rhythm.                 Pulmonary/Chest: Effort normal and breath sounds without rales or wheezing.                Abd:  Soft, NT, ND, + BS, no organomegaly               Neurological: Pt is alert. At baseline orientation, motor grossly intact               Skin: Skin is warm. No rashes, no other new lesions, LE edema - none               Psychiatric: Pt behavior is normal without agitation   Micro: none  Cardiac tracings I have personally interpreted today:  none  Pertinent Radiological findings (summarize): none   Lab  Results  Component Value Date   WBC 8.4 07/10/2020    HGB 15.4 (H) 07/10/2020   HCT 44.6 07/10/2020   PLT 264.0 07/10/2020   GLUCOSE 90 07/10/2020   CHOL 221 (H) 07/10/2020   TRIG 75.0 07/10/2020   HDL 83.40 07/10/2020   LDLDIRECT 93.6 12/26/2010   LDLCALC 123 (H) 07/10/2020   ALT 15 07/10/2020   AST 23 07/10/2020   NA 137 07/10/2020   K 4.2 07/10/2020   CL 99 07/10/2020   CREATININE 0.77 07/10/2020   BUN 19 07/10/2020   CO2 31 07/10/2020   TSH 1.93 07/10/2020   HGBA1C 4.8 07/10/2020   Assessment/Plan:  Brandy Peterson is a 87 y.o. White or Caucasian [1] female with  has a past medical history of Backache, unspecified, Carcinoma of breast ([place]), Complication of anesthesia, COPD (chronic obstructive pulmonary disease) ([place]), Family history of adverse reaction to anesthesia, GERD (gastroesophageal reflux disease), Hemochromatosis, History of shingles, Hypertension, Hypothyroidism, IBS (irritable bowel syndrome), Plantar fasciitis, Pneumonia, Raynaud's syndrome, Spastic colon, Thyroid disease, and Varicose veins.  Urinary incontinence For ua and cx, for urology referral per pt reqeust  Raynaud's phenomenon No sign of other worsening rheum d/o, for lab as per reqeust  Left flank pain Chronic, suspect msk, for urine studies as above  Hypothyroidism . Lab Results  Component Value Date   TSH 1.93 07/10/2020   Stable, pt to continue levothyroxine  Current Outpatient Medications (Endocrine & Metabolic):  .  levothyroxine (SYNTHROID) 88 MCG tablet, Take 1 tablet (88 mcg total) by mouth daily before breakfast.  Current Outpatient Medications (Cardiovascular):  .  metoprolol succinate (TOPROL-XL) 25 MG 24 hr tablet, Take 1 tablet (25 mg total) by mouth daily. Marland Kitchen  triamterene-hydrochlorothiazide (MAXZIDE-25) 37.5-25 MG tablet, Take 1 tablet by mouth daily.  Current Outpatient Medications (Respiratory):  .  fluticasone (FLONASE) 50 MCG/ACT nasal spray, Place 1 spray into both nostrils daily.    Current Outpatient Medications (Other):   Marland Kitchen  DULoxetine (CYMBALTA) 30 MG capsule, Take 1 capsule (30 mg total) by mouth daily. Marland Kitchen  omeprazole (PRILOSEC) 40 MG capsule, Take 1 capsule (40 mg total) by mouth daily.   GERD (gastroesophageal reflux disease) Mild uncontrolled, for increased prilosec 40 qd, and refer GI per pt request  Essential hypertension BP Readings from Last 3 Encounters:  07/10/20 (!) 180/70  05/07/20 137/82  04/24/20 (!) 146/61   Stable, pt to continue medical treatment toprol - declines med change today   Anxiety state Worsening recently, for cymbalta 30 qd, and hopefully to help with chronic lbp as well  Vitamin D deficiency Last vitamin D Lab Results  Component Value Date   VD25OH 45.16 07/10/2020   Stable, cont oral replacement  Hyperglycemia Lab Results  Component Value Date   HGBA1C 4.8 07/10/2020   Stable, pt to continue current medical treatment  - diet   Followup: No follow-ups on file.  [name], MD 07/14/2020 6:25 PM [facility]

## 2020-07-10 NOTE — Patient Instructions (Addendum)
Ok to increase the prilosec to 40 mg per day  Please take all new medication as prescribed - the generic for cymbalta 30 mg per day for pain  Please call if you change your mind about seeing a pain specialist  You will be contacted regarding the referral for: Urology for incontinence, and Gastroenterology for abdominal issues  Please continue all other medications as before, and refills have been done if requested.  Please have the pharmacy call with any other refills you may need.  Please continue your efforts at being more active, low cholesterol diet, and weight control.  You are otherwise up to date with prevention measures today.  Please keep your appointments with your specialists as you may have planned  Please go to the LAB at the blood drawing area for the tests to be done  You will be contacted by phone if any changes need to be made immediately.  Otherwise, you will receive a letter about your results with an explanation, but please check with MyChart first.  Please remember to sign up for MyChart if you have not done so, as this will be important to you in the future with finding out test results, communicating by private email, and scheduling acute appointments online when needed.  Please make an Appointment to return in 6 months, or sooner if needed

## 2020-07-11 ENCOUNTER — Encounter: Payer: Self-pay | Admitting: Internal Medicine

## 2020-07-11 LAB — URINE CULTURE

## 2020-07-11 LAB — ANTI-DNA ANTIBODY, DOUBLE-STRANDED: ds DNA Ab: 3 IU/mL

## 2020-07-14 ENCOUNTER — Encounter: Payer: Self-pay | Admitting: Internal Medicine

## 2020-07-14 DIAGNOSIS — E559 Vitamin D deficiency, unspecified: Secondary | ICD-10-CM | POA: Insufficient documentation

## 2020-07-14 DIAGNOSIS — R739 Hyperglycemia, unspecified: Secondary | ICD-10-CM | POA: Insufficient documentation

## 2020-07-14 NOTE — Assessment & Plan Note (Signed)
Chronic, suspect msk, for urine studies as above

## 2020-07-14 NOTE — Assessment & Plan Note (Signed)
Last vitamin D Lab Results  Component Value Date   VD25OH 45.16 07/10/2020   Stable, cont oral replacement

## 2020-07-14 NOTE — Assessment & Plan Note (Addendum)
Mild uncontrolled, for increased prilosec 40 qd, and refer GI per pt request

## 2020-07-14 NOTE — Assessment & Plan Note (Signed)
.   Lab Results  Component Value Date   TSH 1.93 07/10/2020   Stable, pt to continue levothyroxine  Current Outpatient Medications (Endocrine & Metabolic):  .  levothyroxine (SYNTHROID) 88 MCG tablet, Take 1 tablet (88 mcg total) by mouth daily before breakfast.  Current Outpatient Medications (Cardiovascular):  .  metoprolol succinate (TOPROL-XL) 25 MG 24 hr tablet, Take 1 tablet (25 mg total) by mouth daily. Marland Kitchen  triamterene-hydrochlorothiazide (MAXZIDE-25) 37.5-25 MG tablet, Take 1 tablet by mouth daily.  Current Outpatient Medications (Respiratory):  .  fluticasone (FLONASE) 50 MCG/ACT nasal spray, Place 1 spray into both nostrils daily.    Current Outpatient Medications (Other):  Marland Kitchen  DULoxetine (CYMBALTA) 30 MG capsule, Take 1 capsule (30 mg total) by mouth daily. Marland Kitchen  omeprazole (PRILOSEC) 40 MG capsule, Take 1 capsule (40 mg total) by mouth daily.

## 2020-07-14 NOTE — Assessment & Plan Note (Signed)
No sign of other worsening rheum d/o, for lab as per reqeust

## 2020-07-14 NOTE — Assessment & Plan Note (Signed)
Lab Results  Component Value Date   HGBA1C 4.8 07/10/2020   Stable, pt to continue current medical treatment  - diet

## 2020-07-14 NOTE — Assessment & Plan Note (Signed)
BP Readings from Last 3 Encounters:  07/10/20 (!) 180/70  05/07/20 137/82  04/24/20 (!) 146/61   Stable, pt to continue medical treatment toprol - declines med change today

## 2020-07-14 NOTE — Assessment & Plan Note (Signed)
Worsening recently, for cymbalta 30 qd, and hopefully to help with chronic lbp as well

## 2020-07-14 NOTE — Assessment & Plan Note (Addendum)
For ua and cx, for urology referral per pt reqeust

## 2020-08-06 ENCOUNTER — Telehealth: Payer: Self-pay | Admitting: Hematology

## 2020-08-06 NOTE — Telephone Encounter (Signed)
R/S per sch msg, Patient aware

## 2020-08-27 DIAGNOSIS — R350 Frequency of micturition: Secondary | ICD-10-CM | POA: Diagnosis not present

## 2020-08-27 DIAGNOSIS — N3946 Mixed incontinence: Secondary | ICD-10-CM | POA: Diagnosis not present

## 2020-08-27 DIAGNOSIS — R351 Nocturia: Secondary | ICD-10-CM | POA: Diagnosis not present

## 2020-10-03 DIAGNOSIS — N3946 Mixed incontinence: Secondary | ICD-10-CM | POA: Diagnosis not present

## 2020-10-09 DIAGNOSIS — Z23 Encounter for immunization: Secondary | ICD-10-CM | POA: Diagnosis not present

## 2020-10-28 ENCOUNTER — Inpatient Hospital Stay: Payer: Medicare Other | Attending: Hematology

## 2020-10-28 ENCOUNTER — Other Ambulatory Visit: Payer: Self-pay

## 2020-10-28 LAB — CBC WITH DIFFERENTIAL (CANCER CENTER ONLY)
Abs Immature Granulocytes: 0.02 10*3/uL (ref 0.00–0.07)
Basophils Absolute: 0 10*3/uL (ref 0.0–0.1)
Basophils Relative: 1 %
Eosinophils Absolute: 0.2 10*3/uL (ref 0.0–0.5)
Eosinophils Relative: 3 %
HCT: 41.6 % (ref 36.0–46.0)
Hemoglobin: 14.5 g/dL (ref 12.0–15.0)
Immature Granulocytes: 0 %
Lymphocytes Relative: 24 %
Lymphs Abs: 1.5 10*3/uL (ref 0.7–4.0)
MCH: 32.4 pg (ref 26.0–34.0)
MCHC: 34.9 g/dL (ref 30.0–36.0)
MCV: 92.9 fL (ref 80.0–100.0)
Monocytes Absolute: 0.6 10*3/uL (ref 0.1–1.0)
Monocytes Relative: 9 %
Neutro Abs: 4 10*3/uL (ref 1.7–7.7)
Neutrophils Relative %: 63 %
Platelet Count: 265 10*3/uL (ref 150–400)
RBC: 4.48 MIL/uL (ref 3.87–5.11)
RDW: 12.3 % (ref 11.5–15.5)
WBC Count: 6.5 10*3/uL (ref 4.0–10.5)
nRBC: 0 % (ref 0.0–0.2)

## 2020-10-28 LAB — IRON AND TIBC
Iron: 176 ug/dL — ABNORMAL HIGH (ref 41–142)
Saturation Ratios: 71 % — ABNORMAL HIGH (ref 21–57)
TIBC: 248 ug/dL (ref 236–444)
UIBC: 72 ug/dL — ABNORMAL LOW (ref 120–384)

## 2020-10-28 LAB — FERRITIN: Ferritin: 111 ng/mL (ref 11–307)

## 2020-11-04 ENCOUNTER — Other Ambulatory Visit: Payer: Self-pay

## 2020-11-04 ENCOUNTER — Inpatient Hospital Stay: Payer: Medicare Other

## 2020-11-04 ENCOUNTER — Other Ambulatory Visit: Payer: Medicare Other

## 2020-11-04 NOTE — Patient Instructions (Signed)
Therapeutic Phlebotomy Therapeutic phlebotomy is the planned removal of blood from a person's body for the purpose of treating a medical condition. The procedure is similar to donating blood. Usually, about a pint (470 mL, or 0.47 L) of blood is removed. The average adult has 9-12 pints (4.3-5.7 L) of blood in the body. Therapeutic phlebotomy may be used to treat the following medical conditions:  Hemochromatosis. This is a condition in which the blood contains too much iron.  Polycythemia vera. This is a condition in which the blood contains too many red blood cells.  Porphyria cutanea tarda. This is a disease in which an important part of hemoglobin is not made properly. It results in the buildup of abnormal amounts of porphyrins in the body.  Sickle cell disease. This is a condition in which the red blood cells form an abnormal crescent shape rather than a round shape. Tell a health care provider about:  Any allergies you have.  All medicines you are taking, including vitamins, herbs, eye drops, creams, and over-the-counter medicines.  Any problems you or family members have had with anesthetic medicines.  Any blood disorders you have.  Any surgeries you have had.  Any medical conditions you have.  Whether you are pregnant or may be pregnant. What are the risks? Generally, this is a safe procedure. However, problems may occur, including:  Nausea or light-headedness.  Low blood pressure (hypotension).  Soreness, bleeding, swelling, or bruising at the needle insertion site.  Infection. What happens before the procedure?  Follow instructions from your health care provider about eating or drinking restrictions.  Ask your health care provider about: ? Changing or stopping your regular medicines. This is especially important if you are taking diabetes medicines or blood thinners (anticoagulants). ? Taking medicines such as aspirin and ibuprofen. These medicines can thin your  blood. Do not take these medicines unless your health care provider tells you to take them. ? Taking over-the-counter medicines, vitamins, herbs, and supplements.  Wear clothing with sleeves that can be raised above the elbow.  Plan to have someone take you home from the hospital or clinic.  You may have a blood sample taken.  Your blood pressure, pulse rate, and breathing rate will be measured. What happens during the procedure?  To lower your risk of infection: ? Your health care team will wash or sanitize their hands. ? Your skin will be cleaned with an antiseptic.  You may be given a medicine to numb the area (local anesthetic).  A tourniquet will be placed on your arm.  A needle will be inserted into one of your veins.  Tubing and a collection bag will be attached to that needle.  Blood will flow through the needle and tubing into the collection bag.  The collection bag will be placed lower than your arm to allow gravity to help the flow of blood into the bag.  You may be asked to open and close your hand slowly and continually during the entire collection.  After the specified amount of blood has been removed from your body, the collection bag and tubing will be clamped.  The needle will be removed from your vein.  Pressure will be held on the site of the needle insertion to stop the bleeding.  A bandage (dressing) will be placed over the needle insertion site. The procedure may vary among health care providers and hospitals.   What happens after the procedure?  Your blood pressure, pulse rate, and breathing rate will   be measured after the procedure.  You will be encouraged to drink fluids.  Your recovery will be assessed and monitored.  You can return to your normal activities as told by your health care provider. Summary  Therapeutic phlebotomy is the planned removal of blood from a person's body for the purpose of treating a medical condition.  Therapeutic  phlebotomy may be used to treat hemochromatosis, polycythemia vera, porphyria cutanea tarda, or sickle cell disease.  In the procedure, a needle is inserted and about a pint (470 mL, or 0.47 L) of blood is removed. The average adult has 9-12 pints (4.3-5.7 L) of blood in the body.  This is generally a safe procedure, but it can sometimes cause problems such as nausea, light-headedness, or low blood pressure (hypotension). This information is not intended to replace advice given to you by your health care provider. Make sure you discuss any questions you have with your health care provider. Document Revised: 04/15/2017 Document Reviewed: 04/15/2017 Elsevier Patient Education  2021 Elsevier Inc.  

## 2020-11-04 NOTE — Progress Notes (Signed)
Phlebotomy given per MD order. A 16 gauge needle was placed in the Left AC and 530 mls of blood was removed. Needle was removed fully intact. Patient observed 30 minutes post procedure, snack and beverage provided. Discharged in stable condition.

## 2020-11-07 DIAGNOSIS — R350 Frequency of micturition: Secondary | ICD-10-CM | POA: Diagnosis not present

## 2020-11-07 DIAGNOSIS — N3946 Mixed incontinence: Secondary | ICD-10-CM | POA: Diagnosis not present

## 2020-12-23 ENCOUNTER — Encounter: Payer: Self-pay | Admitting: Internal Medicine

## 2020-12-25 ENCOUNTER — Ambulatory Visit
Admission: RE | Admit: 2020-12-25 | Discharge: 2020-12-25 | Disposition: A | Discharge status: Home / Self Care | Payer: Medicare Other | Source: Ambulatory Visit | Attending: Emergency Medicine | Admitting: Emergency Medicine

## 2020-12-25 DIAGNOSIS — R911 Solitary pulmonary nodule: Secondary | ICD-10-CM

## 2020-12-25 DIAGNOSIS — I700 Atherosclerosis of aorta: Secondary | ICD-10-CM | POA: Diagnosis not present

## 2020-12-25 DIAGNOSIS — J439 Emphysema, unspecified: Secondary | ICD-10-CM | POA: Diagnosis not present

## 2020-12-25 NOTE — Telephone Encounter (Signed)
Sorry, but these were only listed in order to be able to order the tests for vit d, and A1c - we cant really take it back b/c these were used to order the tests;  these are not actually listed in your problem list however, so there should be no confusion in the future about this

## 2020-12-26 ENCOUNTER — Ambulatory Visit (INDEPENDENT_AMBULATORY_CARE_PROVIDER_SITE_OTHER): Payer: Medicare Other | Admitting: Emergency Medicine

## 2020-12-26 ENCOUNTER — Other Ambulatory Visit: Payer: Self-pay

## 2020-12-26 ENCOUNTER — Encounter: Payer: Self-pay | Admitting: Emergency Medicine

## 2020-12-26 DIAGNOSIS — J479 Bronchiectasis, uncomplicated: Secondary | ICD-10-CM

## 2020-12-26 DIAGNOSIS — J309 Allergic rhinitis, unspecified: Secondary | ICD-10-CM | POA: Diagnosis not present

## 2020-12-26 DIAGNOSIS — J449 Chronic obstructive pulmonary disease, unspecified: Secondary | ICD-10-CM | POA: Diagnosis not present

## 2020-12-26 DIAGNOSIS — R911 Solitary pulmonary nodule: Secondary | ICD-10-CM | POA: Diagnosis not present

## 2020-12-26 NOTE — Assessment & Plan Note (Signed)
She does not make sputum, minimal cough.  Given this as well as the stability of her CT chest I do not think we need to push for bronchoscopy.  I did talk to her today about Mycobacterium and possible colonization.  If she shows evidence of progression then we could revisit bronchoscopy to obtain culture data.

## 2020-12-26 NOTE — Progress Notes (Signed)
Subjective:    Patient ID: Brandy Peterson, female    DOB: 08-Mar-1931, 87 y.o.   MRN: [id number]  HPI 87 year old former smoker (40 pack years) with a history of breast cancer, GERD, hypertension, hypothyroidism, IBS, Raynaud syndrome, hemochromatosis, allergic rhinitis (formerly on immunotherapy).  She also carries a history of COPD that was made by Dr Linda Hedges. She is not on BD's.  She is referred today for evaluation of an abnormal CT scan of the chest. She has had off and on R flank and back pain for several years, this persisted and the CT was done to evaluate. Her back pain is currently better. Never coughs. Does not have any SOB unless heavy exertion.   CT chest 04/12/2019 reviewed by me, shows some biapical scar, mild centrilobular emphysematous change, right middle lobe and lingular bronchiectasis with some associated tree-in-bud nodularity, and a 6 mm right middle lobe nodule.  ROV 01/10/20 --Ms. Brandy Peterson is an 87 year old former smoker who follows up today for probable COPD that is asymptomatic, allergic rhinitis and an abnormal CT scan of the chest.  She has some mild bronchiectasis and there is a 6 mm right middle lobe nodule seen December 2020.  We repeated a CT scan of her chest on 12/15/2019 which I have reviewed, shows persistent cylindrical right middle lobe, right upper lobe bronchiectasis with some mucus impaction, some background emphysematous change.  Her 6 mm right middle lobe nodule is unchanged. She is having some allergy drainage right now, on flonase, occasionally uses claritin-D.   ROV 12/26/20 --follow-up visit for 87 year old former smoker with a history of of probable mild COPD (has not been on therapy), allergic rhinitis, mild bronchiectasis.  She also has a stable 6 mm right middle lobe pulmonary nodule.  Today she describes persistent chronic L mid back pain, has been worked up by Neuro, has seen PT, pain management as well. May impact her breathing some - the pain can limit  ability to deep breathe. She can get SOB when moving a large garbage can, yard waste. For the last 2 weeks she has been more SOB due to the back pain. No cough. She does feel a globus sensation over the last 2 weeks. She is not interested in starting a BD right now.  Not on any bronchodilator therapy.  She has fluticasone nasal spray, uses most days. Rare claritin-d.   CT chest 12/25/2020 reviewed by me, showed similar biapical pleural-parenchymal scarring with some associated calcification, mild centrilobular emphysema, chronic bronchiectatic change and airway plugging in the anterior right upper lobe, lingula.  The right middle lobe pulmonary nodule has not changed in size or appearance compared with 04/12/2019   Review of Systems As per HPi  Past Medical History:  Diagnosis Date   Backache, unspecified    Carcinoma of breast ([place])    Right   Complication of anesthesia    was told to never use Sodium Pentothal - mother had reaction. Also had trouble with intubation during mastectomy   COPD (chronic obstructive pulmonary disease) ([place])    Family history of adverse reaction to anesthesia    GERD (gastroesophageal reflux disease)    Hemochromatosis    History of shingles    Hypertension    Hypothyroidism    IBS (irritable bowel syndrome)    Plantar fasciitis    Pneumonia    Raynaud's syndrome    Spastic colon    hx of    Thyroid disease    Varicose veins  Family History  Problem Relation Age of Onset   Cancer Mother        metastatic cancer (pancreas vs lung)   Hypertension Mother    Osteoarthritis Mother    Hypothyroidism Mother    Varicose Veins Mother    Bleeding Disorder Mother    Heart disease Father        CAD/MI   Hypertension Father    Arthritis Father    Hemochromatosis Father        Possible   Deep vein thrombosis Father    Varicose Veins Father    Heart attack Father    Heart attack Brother    Hemochromatosis Brother    Heart disease Brother     Hyperlipidemia Son    Diabetes Neg Hx      Social History   Socioeconomic History   Marital status: Widowed    Spouse name: Not on file   Number of children: 1   Years of education: 31   Highest education level: High school graduate  Occupational History   Occupation: Retired  Tobacco Use   Smoking status: Former    Types: Cigarettes    Quit date: 04/13/1988    Years since quitting: 32.7   Smokeless tobacco: Never   Tobacco comments:    approximate quit date  Vaping Use   Vaping Use: Never used  Substance and Sexual Activity   Alcohol use: Yes    Alcohol/week: 3.0 standard drinks    Types: 3 Glasses of wine per week    Comment: 2oz of wine occasionally   Drug use: No   Sexual activity: Never  Other Topics Concern   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 She now lives alone I-ADLs. Pt has 1 son born 74 and 2 grandchildren.       Pt had a colonoscopy on 05/09/2012 by Dr Oletta Lamas.       Pt reports having a pneumo vaccine.      Right-handed.   4-5 cups caffeine daily.   Lives alone.   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   Allergies  Allergen Reactions   Contrast Media [Iodinated Diagnostic Agents] Hives, Itching and Other (See Comments)    Felt like she was going to faint    Adhesive [Tape] Other (See Comments)    Prefers paper tape   Sulfonamide Derivatives Other (See Comments)    Reaction: unknown      Outpatient Medications Prior to Visit  Medication Sig Dispense Refill   fluticasone (FLONASE) 50 MCG/ACT nasal spray Place 1 spray into both nostrils daily.  48 g 3   levothyroxine (SYNTHROID) 88 MCG tablet Take 1 tablet (88 mcg total) by mouth daily before breakfast. 90 tablet 3   metoprolol succinate (TOPROL-XL) 25 MG 24 hr tablet Take 1 tablet (25 mg total) by mouth daily. 90 tablet 3   omeprazole (PRILOSEC) 40 MG capsule Take 1 capsule (40 mg total) by mouth daily. 90 capsule 3   triamterene-hydrochlorothiazide (MAXZIDE-25) 37.5-25 MG tablet Take 1 tablet by mouth daily. 90 tablet 3  DULoxetine (CYMBALTA) 30 MG capsule Take 1 capsule (30 mg total) by mouth daily. 90 capsule 3   No facility-administered medications prior to visit.         Objective:   Physical Exam Vitals:   12/26/20 0911  BP: 138/72  Pulse: 74  Temp: 97.9 F (36.6 C)  TempSrc: Oral  SpO2: 96%  Weight: 105 lb 6.4 oz (47.8 kg)  Height: '5\' 1"'$  (1.549 m)   Gen: Pleasant, thin, in no distress,  normal affect  ENT: No lesions,  mouth clear,  oropharynx clear, no postnasal drip  Neck: No JVD, no stridor  Lungs: No use of accessory muscles, no crackles or wheezing on normal respiration, no wheeze on forced expiration  Cardiovascular: RRR, heart sounds normal, no murmur or gallops, no peripheral edema  Musculoskeletal: No deformities, no cyanosis or clubbing  Neuro: alert, awake, non focal  Skin: Warm, no lesions or rash     Assessment & Plan:  Pulmonary nodule Right lower lobe pulmonary nodule is unchanged on serial CT scans.  Now stable going back to December 2020.  Would like to achieve over 2 years of stability, plan to repeat 1 more time in September 2023.  This would allow [place] to also follow her bronchiectatic change which has been stable.  Bronchiectasis without acute exacerbation ([place]) She does not make sputum, minimal cough.  Given this as well as the stability of her CT chest I do not think we need to push for bronchoscopy.  I did talk to her today about Mycobacterium and possible colonization.  If she shows evidence of progression then we could revisit  bronchoscopy to obtain culture data.  COPD (chronic obstructive pulmonary disease) ([place]) She does have some dyspnea but it seems to be mainly related to her back discomfort and inability to take a deep breath.  I offered to start her on empiric bronchodilator therapy to see if she would get benefit.  She would like to hold off for now.  Allergic rhinitis Plan to continue her fluticasone as ordered.  She uses Claritin-D rarely, tries to avoid it due to impact on her hypertension.  Time Spent 33 minutes  Baltazar Apo, MD, PhD 12/26/2020, 9:52 AM [facility] [phone] or if no answer [phone]

## 2020-12-26 NOTE — Assessment & Plan Note (Signed)
She does have some dyspnea but it seems to be mainly related to her back discomfort and inability to take a deep breath.  I offered to start her on empiric bronchodilator therapy to see if she would get benefit.  She would like to hold off for now.

## 2020-12-26 NOTE — Assessment & Plan Note (Signed)
Right lower lobe pulmonary nodule is unchanged on serial CT scans.  Now stable going back to December 2020.  Would like to achieve over 2 years of stability, plan to repeat 1 more time in September 2023.  This would allow [place] to also follow her bronchiectatic change which has been stable.

## 2020-12-26 NOTE — Patient Instructions (Addendum)
Continue your fluticasone nasal spray, 2 sprays each nostril once daily. We would not start a scheduled inhaler medication at this time. We reviewed your CT scan of the chest today.  This is overall stable.  We will plan to repeat your CT in September 2023. Follow with Dr Lamonte Sakai in September 2023 after your CT scan, or sooner if you have any problems

## 2020-12-26 NOTE — Assessment & Plan Note (Signed)
Plan to continue her fluticasone as ordered.  She uses Claritin-D rarely, tries to avoid it due to impact on her hypertension.

## 2020-12-26 NOTE — Addendum Note (Signed)
Addended by: Gavin Potters R on: 12/26/2020 10:34 AM   Modules accepted: Orders

## 2020-12-30 NOTE — Telephone Encounter (Signed)
I reviewed her chart, she did have low vit D 5 yrs ago clearly on her record.  She may have also had a slightly high sugar at some point.   I will not remove this.  Please ask pt to consider changing PCP if this is not acceptable.

## 2020-12-30 NOTE — Telephone Encounter (Signed)
Please advise as the pt has called and stated that it is showing under her problem list that she has a Vitamin D deficiency and Hyperglycemia. Pt ask that you please inform her VIA MyChart that these problems are taken off and not accurate.

## 2021-01-17 DIAGNOSIS — Z23 Encounter for immunization: Secondary | ICD-10-CM | POA: Diagnosis not present

## 2021-02-06 ENCOUNTER — Telehealth: Payer: Self-pay | Admitting: Internal Medicine

## 2021-02-06 NOTE — Telephone Encounter (Signed)
Left message for patient to call me back at [phone] to schedule Medicare Annual Wellness Visit   Last AWV  06/16/17  Please schedule at anytime with [facility] if patient calls the office back.    40 Minutes appointment   Any questions, please call me at [phone]

## 2021-02-14 DIAGNOSIS — Z23 Encounter for immunization: Secondary | ICD-10-CM | POA: Diagnosis not present

## 2021-04-29 ENCOUNTER — Other Ambulatory Visit: Payer: Self-pay

## 2021-04-30 ENCOUNTER — Inpatient Hospital Stay (HOSPITAL_BASED_OUTPATIENT_CLINIC_OR_DEPARTMENT_OTHER): Payer: Medicare Other | Admitting: Hematology

## 2021-04-30 ENCOUNTER — Other Ambulatory Visit: Payer: Self-pay

## 2021-04-30 ENCOUNTER — Inpatient Hospital Stay: Payer: Medicare Other | Attending: Hematology

## 2021-04-30 DIAGNOSIS — Z79899 Other long term (current) drug therapy: Secondary | ICD-10-CM | POA: Diagnosis not present

## 2021-04-30 DIAGNOSIS — Z87891 Personal history of nicotine dependence: Secondary | ICD-10-CM | POA: Insufficient documentation

## 2021-04-30 DIAGNOSIS — E039 Hypothyroidism, unspecified: Secondary | ICD-10-CM | POA: Diagnosis not present

## 2021-04-30 DIAGNOSIS — I10 Essential (primary) hypertension: Secondary | ICD-10-CM | POA: Insufficient documentation

## 2021-04-30 LAB — CBC WITH DIFFERENTIAL (CANCER CENTER ONLY)
Abs Immature Granulocytes: 0.06 10*3/uL (ref 0.00–0.07)
Basophils Absolute: 0.1 10*3/uL (ref 0.0–0.1)
Basophils Relative: 1 %
Eosinophils Absolute: 0.2 10*3/uL (ref 0.0–0.5)
Eosinophils Relative: 3 %
HCT: 41 % (ref 36.0–46.0)
Hemoglobin: 14.6 g/dL (ref 12.0–15.0)
Immature Granulocytes: 1 %
Lymphocytes Relative: 17 %
Lymphs Abs: 1.1 10*3/uL (ref 0.7–4.0)
MCH: 32.2 pg (ref 26.0–34.0)
MCHC: 35.6 g/dL (ref 30.0–36.0)
MCV: 90.3 fL (ref 80.0–100.0)
Monocytes Absolute: 0.6 10*3/uL (ref 0.1–1.0)
Monocytes Relative: 9 %
Neutro Abs: 4.8 10*3/uL (ref 1.7–7.7)
Neutrophils Relative %: 69 %
Platelet Count: 244 10*3/uL (ref 150–400)
RBC: 4.54 MIL/uL (ref 3.87–5.11)
RDW: 12.8 % (ref 11.5–15.5)
WBC Count: 6.8 10*3/uL (ref 4.0–10.5)
nRBC: 0 % (ref 0.0–0.2)

## 2021-04-30 LAB — IRON AND IRON BINDING CAPACITY (CC-WL,HP ONLY)
Iron: 205 ug/dL — ABNORMAL HIGH (ref 28–170)
Saturation Ratios: 67 % — ABNORMAL HIGH (ref 10.4–31.8)
TIBC: 307 ug/dL (ref 250–450)
UIBC: 102 ug/dL — ABNORMAL LOW (ref 148–442)

## 2021-04-30 LAB — FERRITIN: Ferritin: 49 ng/mL (ref 11–307)

## 2021-05-04 ENCOUNTER — Encounter: Payer: Self-pay | Admitting: Hematology

## 2021-05-05 ENCOUNTER — Telehealth: Payer: Self-pay | Admitting: Hematology

## 2021-05-05 NOTE — Telephone Encounter (Signed)
Left message with follow-up appointments per 1/18 los. °

## 2021-05-06 ENCOUNTER — Encounter: Payer: Self-pay | Admitting: Hematology

## 2021-05-06 NOTE — Progress Notes (Addendum)
Marland Kitchen    HEMATOLOGY/ONCOLOGY CLINIC NOTE  Date of Service: .04/30/2021   Patient Care Team: Biagio Borg, MD as PCP - General (Internal Medicine) Gordy Levan, MD (Hematology and Oncology) Druscilla Brownie, MD (Dermatology) Laurence Spates, MD (Inactive) (Gastroenterology) Melrose Nakayama, MD as Consulting Physician (Orthopedic Surgery)  CHIEF COMPLAINTS/PURPOSE OF CONSULTATION:  Follow-up for continued evaluation and management of hereditary hemochromatosis  HISTORY OF PRESENTING ILLNESS:   Brandy Peterson is a wonderful 88 y.o. female who has been referred to [place] by Dr Marko Plume for ongoing management of hemochromatosis. She notes that she has a family history of hemochromatosis and was diagnosed in the 59s. She has required maintenance therapy to phlebotomies roughly every 6 months to try to keep her ferritin levels below 50. She notes no known history of liver disease or heart disease. She reports that her ferritin levels even on diagnosis but not more than 1000. She does not recollect what her genetic mutation studies showed. Patient today has no acute new concerns. No new skin pigmentation changes. No new fatigue. No abdominal pain or distention. No shortness of breath or DOE.  INTERVAL HISTORY:  Brandy Peterson is here for her scheduled follow-up for hereditary hemochromatosis. Her last clinic visit with [place] was on 04/24/2020 and her last therapeutic phlebotomy was on 11/04/2020.  She notes no acute new symptoms since her last clinic visit. Labs done today showed normal CBC with hemoglobin of 14.6 with normal WBC count and platelets Ferritin 49 with an iron saturation of 67%.    MEDICAL HISTORY:  Past Medical History:  Diagnosis Date   Backache, unspecified    Carcinoma of breast ([place])    Right   Complication of anesthesia    was told to never use Sodium Pentothal - mother had reaction. Also had trouble with intubation during mastectomy   COPD (chronic obstructive pulmonary  disease) ([place])    Family history of adverse reaction to anesthesia    GERD (gastroesophageal reflux disease)    Hemochromatosis    History of shingles    Hypertension    Hypothyroidism    IBS (irritable bowel syndrome)    Plantar fasciitis    Pneumonia    Raynaud's syndrome    Spastic colon    hx of    Thyroid disease    Varicose veins     SURGICAL HISTORY: Past Surgical History:  Procedure Laterality Date   [place]   CATARACT EXTRACTION  10/2012 and 12/2012   bilateral with lens implants   COLONOSCOPY     KYPHOPLASTY N/A 10/19/2014   Procedure: Lumbar 1 kyphoplasty;  Surgeon: Jovita Gamma, MD;  Location: [facility];  Service: Neurosurgery;  Laterality: N/A;   LUMBAR LAMINECTOMY  1991   L5-S1   MASTECTOMY  1994   RIGHT   MASTECTOMY  1994   Right    SOCIAL HISTORY: Social History   Socioeconomic History   Marital status: Widowed    Spouse name: Not on file   Number of children: 1   Years of education: 53   Highest education level: High school graduate  Occupational History   Occupation: Retired  Tobacco Use   Smoking status: Former    Types: Cigarettes    Quit date: 04/13/1988    Years since quitting: 33.1   Smokeless tobacco: Never   Tobacco comments:    approximate quit date  Vaping Use   Vaping Use: Never used  Substance and Sexual Activity   Alcohol use: Yes    Alcohol/week: 3.0 standard drinks    Types: 3 Glasses of wine per week    Comment: 2oz of wine occasionally   Drug use: No   Sexual activity: Never  Other Topics Concern   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 She now lives alone I-ADLs. Pt has 1 son born 31 and 2 grandchildren.       Pt had a colonoscopy on 05/09/2012 by Dr Oletta Lamas.       Pt reports having a pneumo vaccine.      Right-handed.   4-5  cups caffeine daily.   Lives alone.   Social Determinants of Health   Financial Resource Strain: Not on file  Food Insecurity: Not on file  Transportation Needs: Not on file  Physical Activity: Not on file  Stress: Not on file  Social Connections: Not on file  Intimate Partner Violence: Not on file    FAMILY HISTORY: Family History  Problem Relation Age of Onset   Cancer Mother        metastatic cancer (pancreas vs lung)   Hypertension Mother    Osteoarthritis Mother    Hypothyroidism Mother    Varicose Veins Mother    Bleeding Disorder Mother    Heart disease Father        CAD/MI   Hypertension Father    Arthritis Father    Hemochromatosis Father        Possible   Deep vein thrombosis Father    Varicose Veins Father    Heart attack Father    Heart attack Brother    Hemochromatosis Brother    Heart disease Brother    Hyperlipidemia Son    Diabetes Neg Hx     ALLERGIES:  is allergic to contrast media [iodinated contrast media], adhesive [tape], and sulfonamide derivatives.  MEDICATIONS:  Current Outpatient Medications  Medication Sig Dispense Refill   fluticasone (FLONASE) 50 MCG/ACT nasal spray Place 1 spray into both nostrils daily. 48 g 3   levothyroxine (SYNTHROID) 88 MCG tablet Take 1 tablet (88 mcg total) by mouth daily before breakfast. 90 tablet 3   metoprolol succinate (TOPROL-XL) 25 MG 24 hr tablet Take 1 tablet (25 mg total) by mouth daily. 90 tablet 3   omeprazole (PRILOSEC) 40 MG capsule Take 1 capsule (40 mg total) by mouth daily. 90 capsule 3   triamterene-hydrochlorothiazide (MAXZIDE-25) 37.5-25 MG tablet Take 1 tablet by mouth daily. 90 tablet 3   DULoxetine (CYMBALTA) 30 MG capsule Take 1 capsule (30 mg total) by mouth daily. 90 capsule 3   No current facility-administered medications for this visit.    REVIEW OF SYSTEMS:   .10 Point review of Systems was done is negative except as noted above.  PHYSICAL EXAMINATION: ECOG PERFORMANCE STATUS:  1 - Symptomatic but completely ambulatory . Vitals:   04/30/21 1055  BP: (!) 174/61  Pulse: 60  Resp: 18  Temp: 97.7 F (36.5 C)  SpO2: 99%   Filed Weights   04/30/21 1055  Weight: 109 lb 4.8 oz (49.6 kg)   .Body mass index is 20.65 kg/m.  NAD . GENERAL:alert, in no acute distress and comfortable SKIN: no acute rashes, no significant lesions EYES: conjunctiva are pink and non-injected, sclera anicteric OROPHARYNX: MMM, no exudates, no oropharyngeal erythema or ulceration  NECK: supple, no JVD LYMPH:  no palpable lymphadenopathy in the cervical, axillary or inguinal regions LUNGS: clear to auscultation b/l with normal respiratory effort HEART: regular rate & rhythm ABDOMEN:  normoactive bowel sounds , non tender, not distended. Extremity: no pedal edema PSYCH: alert & oriented x 3 with fluent speech NEURO: no focal motor/sensory deficits   LABORATORY DATA:  I have reviewed the data as listed  . CBC Latest Ref Rng & Units 04/30/2021 10/28/2020 07/10/2020  WBC 4.0 - 10.5 K/uL 6.8 6.5 8.4  Hemoglobin 12.0 - 15.0 g/dL 14.6 14.5 15.4(H)  Hematocrit 36.0 - 46.0 % 41.0 41.6 44.6  Platelets 150 - 400 K/uL 244 265 264.0    . CMP Latest Ref Rng & Units 07/10/2020 04/24/2020 10/17/2019  Glucose 70 - 99 mg/dL 90 96 87  BUN 6 - 23 mg/dL 19 15 16   Creatinine 0.40 - 1.20 mg/dL 0.77 0.77 0.87  Sodium 135 - 145 mEq/L 137 134(L) 139  Potassium 3.5 - 5.1 mEq/L 4.2 4.3 4.0  Chloride 96 - 112 mEq/L 99 99 104  CO2 19 - 32 mEq/L 31 30 26   Calcium 8.4 - 10.5 mg/dL 9.8 8.9 8.7(L)  Total Protein 6.0 - 8.3 g/dL 7.3 6.7 6.5  Total Bilirubin 0.2 - 1.2 mg/dL 0.6 0.7 0.7  Alkaline Phos 39 - 117 U/L 87 88 82  AST 0 - 37 U/L 23 20 20   ALT 0 - 35 U/L 15 15 16    . Lab Results  Component Value Date   IRON 205 (H) 04/30/2021   TIBC 307 04/30/2021   IRONPCTSAT 67 (H) 04/30/2021   (Iron and TIBC)  Lab Results  Component Value Date   FERRITIN 49 04/30/2021    Hemochromatosis  DNA-PCR(c282y,h63d)  Order: [id number]  Status:  Final result   Visible to patient:  Yes (MyChart) Next appt:  10/06/2017 at 10:45 AM in Oncology [facility] Lab 1) Dx:  Hereditary hemochromatosis ([place])  Component 3wk ago  Hemochromatosis Gene Comment   Comment: Result:  AFFECTED  Two copies of the same mutation (C282Y and C282Y) identified         RADIOGRAPHIC STUDIES: I have personally reviewed the radiological images as listed and agreed with the findings in the report. No results found.  ASSESSMENT & PLAN:   88 y.o. female with  #1 history of hemochromatosis . Homozygous C282Y mutation  Patient notes that she has never had a ferritin level more than 1000. Family history positive. Her goal has been to keep her ferritin levels below 50. She has roughly needed a therapeutic phlebotomy every 6 months or so.  PLAN:  -Labs done today reviewed with the patient.  CBC within normal limits.  Ferritin at 49 with an iron saturation of 67%. -We discussed that her ferritin levels are borderline and her iron saturation is also borderline increased. -After initially discussing and making the decision to hold off on therapeutic phlebotomy for another 6 months she later called the clinic and was keen to have an additional phlebotomy which was done on 05/13/2021 -She will continue to have labs and as needed therapeutic phlebotomies every 6 months for ferritin of more than 50 and iron saturation of more than 60% if her hemoglobin is stable. -We discussed that with increasing age blood volume shifts from therapeutic phlebotomies might be tolerated less well and might lead to changes in recommendations.  #2 T1N0 node negative right breast cancer 1994, post mastectomy with nodes, 5 years of adjuvant tamoxifen. No clinical evidence of  breast cancer recurrence at this time . Plan  -Continue yearly mammograms with primary care physician.   FOLLOW UP: Please schedule next therapeutic phlebotomy every 6  months for 4 times with labs done 1 week prior to each phlebotomy MD visit in 12 months   All of the patient's questions were answered with apparent satisfaction. The patient knows to call the clinic with any problems, questions or concerns.    Sullivan Lone MD [place] AAHIVMS [facility] Hematology/Oncology Physician [facility]

## 2021-05-07 ENCOUNTER — Other Ambulatory Visit: Payer: Medicare Other

## 2021-05-07 ENCOUNTER — Ambulatory Visit: Payer: Medicare Other | Admitting: Hematology

## 2021-05-08 ENCOUNTER — Encounter: Payer: Self-pay | Admitting: Internal Medicine

## 2021-05-09 ENCOUNTER — Telehealth: Payer: Self-pay

## 2021-05-09 NOTE — Telephone Encounter (Signed)
Called patient regarding the scheduling of a phlebotomy week of 05/12/21. VM without pt identifier so did not leave message. My chart message sent.

## 2021-05-13 ENCOUNTER — Other Ambulatory Visit: Payer: Self-pay

## 2021-05-13 ENCOUNTER — Inpatient Hospital Stay: Payer: Medicare Other

## 2021-05-13 NOTE — Progress Notes (Signed)
Brandy Peterson presents today for phlebotomy per MD orders. 16G needle placed in L AC at 1418 Phlebotomy procedure started at 1418 and ended at 1426. 488 grams removed. Patient stated her hand was feeling weak at 1426 and wanted to stop procedure. Upon this RN removing needle, pt lost consciousness. IV needle removed intact. Pt laid back in chair and feet were put up, regained alert and oriented baseline within 30 seconds. MD aware, no fluids needed per MD. She ate a few crackers and bp 84/52, MD notified again and request 500cc of NS. Pt declined and stated she felt fine, requested to recheck in 10 minutes. Pt continued to drink sprite and ate additional peanut butter crackers. At 1510 bp recheck was 106/58, pt states she is feeling well with no weakness or dizziness and states she "does not feel that her blood pressure is low." MD aware and ok without fluids and ok with discharge. Ambulatory to lobby.

## 2021-05-29 DIAGNOSIS — L821 Other seborrheic keratosis: Secondary | ICD-10-CM | POA: Diagnosis not present

## 2021-05-29 DIAGNOSIS — H61001 Unspecified perichondritis of right external ear: Secondary | ICD-10-CM | POA: Diagnosis not present

## 2021-05-29 DIAGNOSIS — D1801 Hemangioma of skin and subcutaneous tissue: Secondary | ICD-10-CM | POA: Diagnosis not present

## 2021-05-29 DIAGNOSIS — D485 Neoplasm of uncertain behavior of skin: Secondary | ICD-10-CM | POA: Diagnosis not present

## 2021-05-29 DIAGNOSIS — L570 Actinic keratosis: Secondary | ICD-10-CM | POA: Diagnosis not present

## 2021-06-12 DIAGNOSIS — H6123 Impacted cerumen, bilateral: Secondary | ICD-10-CM | POA: Insufficient documentation

## 2021-06-12 DIAGNOSIS — H900 Conductive hearing loss, bilateral: Secondary | ICD-10-CM | POA: Insufficient documentation

## 2021-06-25 ENCOUNTER — Ambulatory Visit: Payer: Medicare Other | Admitting: Internal Medicine

## 2021-06-25 ENCOUNTER — Encounter: Payer: Self-pay | Admitting: Internal Medicine

## 2021-06-25 ENCOUNTER — Ambulatory Visit (INDEPENDENT_AMBULATORY_CARE_PROVIDER_SITE_OTHER): Payer: Medicare Other | Admitting: Internal Medicine

## 2021-06-25 ENCOUNTER — Other Ambulatory Visit: Payer: Self-pay

## 2021-06-25 VITALS — BP 126/82 | HR 67 | Resp 18 | Ht 61.0 in | Wt 110.6 lb

## 2021-06-25 DIAGNOSIS — I10 Essential (primary) hypertension: Secondary | ICD-10-CM | POA: Diagnosis not present

## 2021-06-25 DIAGNOSIS — R109 Unspecified abdominal pain: Secondary | ICD-10-CM | POA: Diagnosis not present

## 2021-06-25 DIAGNOSIS — R739 Hyperglycemia, unspecified: Secondary | ICD-10-CM | POA: Diagnosis not present

## 2021-06-25 DIAGNOSIS — J309 Allergic rhinitis, unspecified: Secondary | ICD-10-CM

## 2021-06-25 DIAGNOSIS — E039 Hypothyroidism, unspecified: Secondary | ICD-10-CM

## 2021-06-25 DIAGNOSIS — E559 Vitamin D deficiency, unspecified: Secondary | ICD-10-CM

## 2021-06-25 DIAGNOSIS — E7800 Pure hypercholesterolemia, unspecified: Secondary | ICD-10-CM

## 2021-06-25 DIAGNOSIS — E538 Deficiency of other specified B group vitamins: Secondary | ICD-10-CM

## 2021-06-25 DIAGNOSIS — E785 Hyperlipidemia, unspecified: Secondary | ICD-10-CM | POA: Insufficient documentation

## 2021-06-25 LAB — URINALYSIS, ROUTINE W REFLEX MICROSCOPIC
Bilirubin Urine: NEGATIVE
Hgb urine dipstick: NEGATIVE
Ketones, ur: NEGATIVE
Nitrite: NEGATIVE
RBC / HPF: NONE SEEN (ref 0–?)
Specific Gravity, Urine: 1.01 (ref 1.000–1.030)
Total Protein, Urine: NEGATIVE
Urine Glucose: NEGATIVE
Urobilinogen, UA: 0.2 (ref 0.0–1.0)
pH: 6.5 (ref 5.0–8.0)

## 2021-06-25 LAB — CBC WITH DIFFERENTIAL/PLATELET
Basophils Absolute: 0 10*3/uL (ref 0.0–0.1)
Basophils Relative: 0.6 % (ref 0.0–3.0)
Eosinophils Absolute: 0.2 10*3/uL (ref 0.0–0.7)
Eosinophils Relative: 2.9 % (ref 0.0–5.0)
HCT: 41.3 % (ref 36.0–46.0)
Hemoglobin: 14.2 g/dL (ref 12.0–15.0)
Lymphocytes Relative: 13.4 % (ref 12.0–46.0)
Lymphs Abs: 1.1 10*3/uL (ref 0.7–4.0)
MCHC: 34.4 g/dL (ref 30.0–36.0)
MCV: 94.4 fl (ref 78.0–100.0)
Monocytes Absolute: 0.6 10*3/uL (ref 0.1–1.0)
Monocytes Relative: 7.6 % (ref 3.0–12.0)
Neutro Abs: 5.9 10*3/uL (ref 1.4–7.7)
Neutrophils Relative %: 75.5 % (ref 43.0–77.0)
Platelets: 265 10*3/uL (ref 150.0–400.0)
RBC: 4.37 Mil/uL (ref 3.87–5.11)
RDW: 13 % (ref 11.5–15.5)
WBC: 7.9 10*3/uL (ref 4.0–10.5)

## 2021-06-25 LAB — BASIC METABOLIC PANEL
BUN: 16 mg/dL (ref 6–23)
CO2: 31 mEq/L (ref 19–32)
Calcium: 9.1 mg/dL (ref 8.4–10.5)
Chloride: 98 mEq/L (ref 96–112)
Creatinine, Ser: 0.75 mg/dL (ref 0.40–1.20)
GFR: 71.06 mL/min (ref >60.00)
Glucose, Bld: 89 mg/dL (ref 70–99)
Potassium: 4.3 mEq/L (ref 3.5–5.1)
Sodium: 134 mEq/L — ABNORMAL LOW (ref 135–145)

## 2021-06-25 LAB — TSH: TSH: 1.45 u[IU]/mL (ref 0.35–5.50)

## 2021-06-25 LAB — VITAMIN D 25 HYDROXY (VIT D DEFICIENCY, FRACTURES): VITD: 45.87 ng/mL (ref 30.00–100.00)

## 2021-06-25 LAB — HEPATIC FUNCTION PANEL
ALT: 15 U/L (ref 0–35)
AST: 21 U/L (ref 0–37)
Albumin: 4.2 g/dL (ref 3.5–5.2)
Alkaline Phosphatase: 86 U/L (ref 39–117)
Bilirubin, Direct: 0.1 mg/dL (ref 0.0–0.3)
Total Bilirubin: 0.5 mg/dL (ref 0.2–1.2)
Total Protein: 6.6 g/dL (ref 6.0–8.3)

## 2021-06-25 LAB — HEMOGLOBIN A1C: Hgb A1c MFr Bld: 4.8 % (ref 4.6–6.5)

## 2021-06-25 LAB — LIPID PANEL
Cholesterol: 208 mg/dL — ABNORMAL HIGH (ref 0–200)
HDL: 80.9 mg/dL (ref >39.00)
LDL Cholesterol: 103 mg/dL — ABNORMAL HIGH (ref 0–99)
NonHDL: 127.28
Total CHOL/HDL Ratio: 3
Triglycerides: 119 mg/dL (ref 0.0–149.0)
VLDL: 23.8 mg/dL (ref 0.0–40.0)

## 2021-06-25 LAB — VITAMIN B12: Vitamin B-12: 420 pg/mL (ref 211–911)

## 2021-06-25 MED ORDER — OMEPRAZOLE 40 MG PO CPDR: 40.0000 mg | DELAYED_RELEASE_CAPSULE | Freq: Every day | ORAL | 3 refills | Status: DC

## 2021-06-25 MED ORDER — LOSARTAN POTASSIUM 50 MG PO TABS: 50.0000 mg | ORAL_TABLET | Freq: Every day | ORAL | 3 refills | Status: DC

## 2021-06-25 MED ORDER — LEVOTHYROXINE SODIUM 88 MCG PO TABS: 88.0000 ug | ORAL_TABLET | Freq: Every day | ORAL | 3 refills | Status: DC

## 2021-06-25 MED ORDER — TRIAMCINOLONE ACETONIDE 55 MCG/ACT NA AERO: 2.0000 | INHALATION_SPRAY | Freq: Every day | NASAL | 3 refills | Status: DC

## 2021-06-25 MED ORDER — TRIAMTERENE-HCTZ 37.5-25 MG PO TABS: 1.0000 | ORAL_TABLET | Freq: Every day | ORAL | 3 refills | Status: DC

## 2021-06-25 NOTE — Progress Notes (Signed)
Patient ID: Brandy Peterson, female   DOB: [birth year], 88 y.o.   MRN: [id number] ? ? ? ?     Chief Complaint:: yearly exam and Follow-up (She would like to have her thyroid checked. She wants to discuss mediation options for her back.) ?   ?     HPI:  Brandy Peterson is a 88 y.o. female here overall doing ok but  Also has chronic mid back pain not better with cymbalta, gabapentin (caused blurred vision), and tramadol  (caused headaches), and even tried  otc salon paz which only helped a few minutes.  Sensitive to touch but better to lie down at night.  Has seen neurology and pulmonary, Neurosurgury dr Sherwood Gambler before retired, and pain management with PT, nothing really helped.  Denies hyper or hypo thyroid symptoms such as voice, skin or hair change, asks for lab testing today.  Does have several wks ongoing nasal allergy symptoms with clearish congestion, itch and sneezing, without fever, pain, ST, cough, swelling or wheezing - flonase not working as well recently. Pt is adamant that BP at home has been about 140-150 sbp consistently,, asks for better control ?  ?Wt Readings from Last 3 Encounters:  ?06/25/21 110 lb 9.6 oz (50.2 kg)  ?04/30/21 109 lb 4.8 oz (49.6 kg)  ?12/26/20 105 lb 6.4 oz (47.8 kg)  ? ?BP Readings from Last 3 Encounters:  ?06/25/21 126/82  ?05/13/21 (!) 106/58  ?04/30/21 (!) 174/61  ? ?Immunization History  ?Administered Date(s) Administered  ? Fluad Quad(high Dose 65+) 12/14/2018, 12/12/2019  ? H1N1 03/21/2008  ? Influenza Split 12/29/2011  ? Influenza Whole 12/24/2009  ? Influenza, High Dose Seasonal PF 01/21/2016, 01/21/2017, 01/07/2018  ? Influenza,inj,Quad PF,6+ Mos 01/16/2014  ? Influenza-Unspecified 01/02/2013, 03/11/2015, 12/14/2018  ? Moderna Sars-Covid-2 Vaccination 05/04/2019, 06/09/2019, 02/05/2020, 10/09/2020  ? Pneumococcal Conjugate-13 05/02/2013  ? Pneumococcal Polysaccharide-23 01/25/2002  ? Tdap 04/24/2005, 04/05/2015  ? ?There are no preventive care reminders to display for this  patient. ? ?  ? ?Past Medical History:  ?Diagnosis Date  ? Backache, unspecified   ? Carcinoma of breast ([place])   ? Right  ? Complication of anesthesia   ? was told to never use Sodium Pentothal - mother had reaction. Also had trouble with intubation during mastectomy  ? COPD (chronic obstructive pulmonary disease) ([place])   ? Family history of adverse reaction to anesthesia   ? GERD (gastroesophageal reflux disease)   ? Hemochromatosis   ? History of shingles   ? Hypertension   ? Hypothyroidism   ? IBS (irritable bowel syndrome)   ? Plantar fasciitis   ? Pneumonia   ? Raynaud's syndrome   ? Spastic colon   ? hx of   ? Thyroid disease   ? Varicose veins   ? ?Past Surgical History:  ?Procedure Laterality Date  ? ABDOMINAL HYSTERECTOMY  1979  ? PARTIAL  ? APPENDECTOMY    ? BONE SPUR  1970  ? BOTH FEET  ? CATARACT EXTRACTION  10/2012 and 12/2012  ? bilateral with lens implants  ? COLONOSCOPY    ? KYPHOPLASTY N/A 10/19/2014  ? Procedure: Lumbar 1 kyphoplasty;  Surgeon: Jovita Gamma, MD;  Location: [place] [facility];  Service: Neurosurgery;  Laterality: N/A;  ? LUMBAR LAMINECTOMY  1991  ? L5-S1  ? MASTECTOMY  1994  ? RIGHT  ? MASTECTOMY  1994  ? Right  ? ? reports that she quit smoking about 33 years ago. Her smoking use included cigarettes. She has never  used smokeless tobacco. She reports current alcohol use of about 3.0 standard drinks per week. She reports that she does not use drugs. ?family history includes Arthritis in her father; Bleeding Disorder in her mother; Cancer in her mother; Deep vein thrombosis in her father; Heart attack in her brother and father; Heart disease in her brother and father; Hemochromatosis in her brother and father; Hyperlipidemia in her son; Hypertension in her father and mother; Hypothyroidism in her mother; Osteoarthritis in her mother; Varicose Veins in her father and mother. ?Allergies  ?Allergen Reactions  ? Contrast Media [Iodinated Contrast Media] Hives, Itching and Other (See Comments)  ?   Felt like she was going to faint   ? Adhesive [Tape] Other (See Comments)  ?  Prefers paper tape  ? Sulfonamide Derivatives Other (See Comments)  ?  Reaction: unknown   ? ?Current Outpatient Medications on File Prior to Visit  ?Medication Sig Dispense Refill  ? metoprolol succinate (TOPROL-XL) 25 MG 24 hr tablet Take 1 tablet (25 mg total) by mouth daily. 90 tablet 3  ? ?No current facility-administered medications on file prior to visit.  ? ?     ROS:  All others reviewed and negative. ? ?Objective  ? ?     PE:  BP 126/82   Pulse 67   Resp 18   Ht '5\' 1"'$  (1.549 m)   Wt 110 lb 9.6 oz (50.2 kg)   SpO2 100%   BMI 20.90 kg/m?  ? ?              Constitutional: Pt appears in NAD ?              HENT: Head: NCAT.  ?              Right Ear: External ear normal.   ?              Left Ear: External ear normal. Bilat tm's with mild erythema.  Max sinus areas non tender.  Pharynx with mild erythema, no exudate ?              Eyes: . Pupils are equal, round, and reactive to light. Conjunctivae and EOM are normal ?              Nose: without d/c or deformity ?              Neck: Neck supple. Gross normal ROM ?              Cardiovascular: Normal rate and regular rhythm.   ?              Pulmonary/Chest: Effort normal and breath sounds without rales or wheezing.  ?              Abd:  Soft, NT, ND, + BS, no organomegaly ?              Neurological: Pt is alert. At baseline orientation, motor grossly intact ?              Skin: Skin is warm. No rashes, no other new lesions, LE edema - none ?              Psychiatric: Pt behavior is normal without agitation  ? ?Micro: none ? ?Cardiac tracings I have personally interpreted today:  none ? ?Pertinent Radiological findings (summarize): none  ? ?Lab Results  ?Component Value Date  ? WBC 7.9 06/25/2021  ? HGB 14.2 06/25/2021  ?  HCT 41.3 06/25/2021  ? PLT 265.0 06/25/2021  ? GLUCOSE 89 06/25/2021  ? CHOL 208 (H) 06/25/2021  ? TRIG 119.0 06/25/2021  ? HDL 80.90 06/25/2021  ? LDLDIRECT  93.6 12/26/2010  ? LDLCALC 103 (H) 06/25/2021  ? ALT 15 06/25/2021  ? AST 21 06/25/2021  ? NA 134 (L) 06/25/2021  ? K 4.3 06/25/2021  ? CL 98 06/25/2021  ? CREATININE 0.75 06/25/2021  ? BUN 16 06/25/2021  ? CO2 31 06/25/2021  ? TSH 1.45 06/25/2021  ? HGBA1C 4.8 06/25/2021  ? ?Assessment/Plan:  ?KELICIA YOUTZ is a 88 y.o. White or Caucasian [1] female with  has a past medical history of Backache, unspecified, Carcinoma of breast ([place]), Complication of anesthesia, COPD (chronic obstructive pulmonary disease) ([place]), Family history of adverse reaction to anesthesia, GERD (gastroesophageal reflux disease), Hemochromatosis, History of shingles, Hypertension, Hypothyroidism, IBS (irritable bowel syndrome), Plantar fasciitis, Pneumonia, Raynaud's syndrome, Spastic colon, Thyroid disease, and Varicose veins. ? ?Left flank pain ?Chronic persistent despite numerous specialty eval and attempts at tx - d/w pt, I have nothing further to offer at this time ? ?Vitamin D deficiency ?Last vitamin D ?Lab Results  ?Component Value Date  ? VD25OH 45.87 06/25/2021  ? ?Stable, cont oral replacement ? ? ?Hypothyroidism ?Lab Results  ?Component Value Date  ? TSH 1.45 06/25/2021  ? ?Stable, pt to continue levothyroxine ? ? ?Hyperglycemia ?Lab Results  ?Component Value Date  ? HGBA1C 4.8 06/25/2021  ? ?Stable, pt to continue current medical treatment  - diet ? ? ?HLD (hyperlipidemia) ?Lab Results  ?Component Value Date  ? LDLCALC 103 (H) 06/25/2021  ? ?Mild uncontrolled, goal ldl < 100,, pt to continue current low chol diet, declines statin or zetia ? ? ?Essential hypertension ?BP Readings from Last 3 Encounters:  ?06/25/21 126/82  ?05/13/21 (!) 106/58  ?04/30/21 (!) 174/61  ? ?Stable here but pt adamant bp not doing well at home, pt to continue medical treatment toprol maxide and start losartan 50 qd ? ?Allergic rhinitis ?Mild uncontrolled, ok to change the flonase to otc nasacort asd,  to f/u any worsening symptoms or concerns ? ?Followup:  No follow-ups on file. ? ?[name], MD 06/26/2021 4:43 AM ?[facility] ?[place] ?Internal Medicine ?

## 2021-06-25 NOTE — Patient Instructions (Addendum)
Please take all new medication as prescribed - the losartan 50 mg per day ? ?Ok to change the flonase to the nasacort ? ?Please continue all other medications as before, and refills have been done if requested. ? ?Please have the pharmacy call with any other refills you may need. ? ?Please continue your efforts at being more active, low cholesterol diet, and weight control. ? ?You are otherwise up to date with prevention measures today. ? ?Please keep your appointments with your specialists as you may have planned ? ?Please go to the LAB at the blood drawing area for the tests to be done ? ?You will be contacted by phone if any changes need to be made immediately.  Otherwise, you will receive a letter about your results with an explanation, but please check with MyChart first. ? ?Please remember to sign up for MyChart if you have not done so, as this will be important to you in the future with finding out test results, communicating by private email, and scheduling acute appointments online when needed. ? ?Please make an Appointment to return for your 1 year visit, or sooner if needed ?

## 2021-06-26 NOTE — Assessment & Plan Note (Signed)
Chronic persistent despite numerous specialty eval and attempts at tx - d/w pt, I have nothing further to offer at this time ?

## 2021-06-26 NOTE — Assessment & Plan Note (Signed)
Last vitamin D ?Lab Results  ?Component Value Date  ? VD25OH 45.87 06/25/2021  ? ?Stable, cont oral replacement ? ?

## 2021-06-26 NOTE — Assessment & Plan Note (Signed)
Mild uncontrolled, ok to change the flonase to otc nasacort asd,  to f/u any worsening symptoms or concerns ? ?

## 2021-06-26 NOTE — Assessment & Plan Note (Addendum)
BP Readings from Last 3 Encounters:  ?06/25/21 126/82  ?05/13/21 (!) 106/58  ?04/30/21 (!) 174/61  ? ?Stable here but pt adamant bp not doing well at home, pt to continue medical treatment toprol maxide and start losartan 50 qd ?

## 2021-06-26 NOTE — Assessment & Plan Note (Signed)
Lab Results  ?Component Value Date  ? TSH 1.45 06/25/2021  ? ?Stable, pt to continue levothyroxine ? ?

## 2021-06-26 NOTE — Assessment & Plan Note (Signed)
Lab Results  ?Component Value Date  ? LDLCALC 103 (H) 06/25/2021  ? ?Mild uncontrolled, goal ldl < 100,, pt to continue current low chol diet, declines statin or zetia ? ?

## 2021-06-26 NOTE — Assessment & Plan Note (Signed)
Lab Results  ?Component Value Date  ? HGBA1C 4.8 06/25/2021  ? ?Stable, pt to continue current medical treatment  - diet ? ?

## 2021-07-24 ENCOUNTER — Emergency Department (HOSPITAL_BASED_OUTPATIENT_CLINIC_OR_DEPARTMENT_OTHER)
Admission: EM | Admit: 2021-07-24 | Discharge: 2021-07-24 | Disposition: A | Discharge status: Home / Self Care | Payer: Medicare Other | Attending: Emergency Medicine | Admitting: Emergency Medicine

## 2021-07-24 ENCOUNTER — Encounter (HOSPITAL_BASED_OUTPATIENT_CLINIC_OR_DEPARTMENT_OTHER): Payer: Self-pay

## 2021-07-24 ENCOUNTER — Other Ambulatory Visit: Payer: Self-pay

## 2021-07-24 DIAGNOSIS — W208XXA Other cause of strike by thrown, projected or falling object, initial encounter: Secondary | ICD-10-CM | POA: Insufficient documentation

## 2021-07-24 DIAGNOSIS — S8992XA Unspecified injury of left lower leg, initial encounter: Secondary | ICD-10-CM | POA: Diagnosis present

## 2021-07-24 DIAGNOSIS — S8012XA Contusion of left lower leg, initial encounter: Secondary | ICD-10-CM | POA: Diagnosis not present

## 2021-07-24 DIAGNOSIS — T148XXA Other injury of unspecified body region, initial encounter: Secondary | ICD-10-CM

## 2021-07-24 MED ORDER — BACITRACIN ZINC 500 UNIT/GM EX OINT: 1.0000 "application " | TOPICAL_OINTMENT | Freq: Two times a day (BID) | CUTANEOUS | 0 refills | Status: DC

## 2021-07-24 NOTE — ED Provider Notes (Signed)
?[facility] ?Provider Note ? ? ?CSN: [id number] ?Arrival date & time: 07/24/21  1211 ? ?  ? ?History ? ?No chief complaint on file. ? ? ?Brandy Peterson is a 88 y.o. female who presents the emergency department complaining of a bruise on her left lower leg.  Patient states that last night something fell off of a shelf directly onto her left lower shin.  She has been able to ambulate without difficulty, but reports a large bruise on the leg.  She was concerned about it developing infection.  She did not notice any external bleeding/breaks in the skin.  No numbness, or difficulties moving the leg. ? ?HPI ? ?  ? ?Home Medications ?Prior to Admission medications   ?Medication Sig Start Date End Date Taking? Authorizing Provider  ?bacitracin ointment Apply 1 application. topically 2 (two) times daily. 07/24/21  Yes Gilliam Hawkes T, PA-C  ?levothyroxine (SYNTHROID) 88 MCG tablet Take 1 tablet (88 mcg total) by mouth daily before breakfast. 06/25/21   Biagio Borg, MD  ?losartan (COZAAR) 50 MG tablet Take 1 tablet (50 mg total) by mouth daily. 06/25/21   Biagio Borg, MD  ?metoprolol succinate (TOPROL-XL) 25 MG 24 hr tablet Take 1 tablet (25 mg total) by mouth daily. 07/10/20   Biagio Borg, MD  ?omeprazole (PRILOSEC) 40 MG capsule Take 1 capsule (40 mg total) by mouth daily. 06/25/21   Biagio Borg, MD  ?triamcinolone (NASACORT) 55 MCG/ACT AERO nasal inhaler Place 2 sprays into the nose daily. 06/25/21   Biagio Borg, MD  ?triamterene-hydrochlorothiazide (MAXZIDE-25) 37.5-25 MG tablet Take 1 tablet by mouth daily. 06/25/21   Biagio Borg, MD  ?   ? ?Allergies    ?Contrast media [iodinated contrast media], Adhesive [tape], and Sulfonamide derivatives   ? ?Review of Systems   ?Review of Systems  ?Skin:   ?     Left leg bruise  ?All other systems reviewed and are negative. ? ?Physical Exam ?Updated Vital Signs ?BP (!) 189/76 (BP Location: Left Arm)   Pulse 69   Temp 97.9 ?F (36.6 ?C)   Resp 18    Ht '5\' 1"'$  (1.549 m)   Wt 49.4 kg   SpO2 99%   BMI 20.60 kg/m?  ?Physical Exam ?Vitals and nursing note reviewed.  ?Constitutional:   ?   Appearance: Normal appearance.  ?HENT:  ?   Head: Normocephalic and atraumatic.  ?Eyes:  ?   Conjunctiva/sclera: Conjunctivae normal.  ?Cardiovascular:  ?   Pulses:     ?     Posterior tibial pulses are 1+ on the right side and 1+ on the left side.  ?Pulmonary:  ?   Effort: Pulmonary effort is normal. No respiratory distress.  ?Musculoskeletal:  ?   Comments: Normal passive ROM of bilateral knees and ankles. Sensation in tact.   ?Skin: ?   General: Skin is warm and dry.  ?   Comments: Large hematoma noted to the left shin.  No breaks in the skin.  No cellulitic changes.  ?Neurological:  ?   Mental Status: She is alert.  ?Psychiatric:     ?   Mood and Affect: Mood normal.     ?   Behavior: Behavior normal.  ? ? ? ? ? ? ? ?ED Results / Procedures / Treatments   ?Labs ?(all labs ordered are listed, but only abnormal results are displayed) ?Labs Reviewed - No data to display ? ?EKG ?None ? ?Radiology ?No results  found. ? ?Procedures ?Procedures  ? ? ?Medications Ordered in ED ?Medications - No data to display ? ?ED Course/ Medical Decision Making/ A&P ?  ?                        ?Medical Decision Making ?Risk ?OTC drugs. ? ?This patient is a 88 year old female who presents to the ED for concern of left pain injury and bruising.  ? ?Past Medical History / Co-morbidities / Social History: ?Raynaud's, hemochromatosis, thyroid disease, IBS, COPD, varicose veins, GERD ? ?Physical Exam: ?Physical exam performed. The pertinent findings include: left lower leg hematoma without breaks in the skin.  No cellulitic changes.  Low concern for infection. ? ?Imaging: ?Discussed with the patient that I would like to get imaging to make sure there are no underlying fractures, and she refuses this.  She states "I know it is not broken". ?  ?Disposition: ?After consideration of the diagnostic results  and the patients response to treatment, I feel that patient's not requiring admission or inpatient treatment for her symptoms.  Discussed that while the area does not look infected at this time, there be an increasing risk of infection once it opens on its own.  Will prescribe her antibiotic ointment to use when that happens.  She understands she should follow-up with her primary doctor for a wound check in the next week or so. Discussed reasons to return to the emergency department, and the patient is agreeable to the plan. ? ?I discussed this case with my attending physician Dr. Rogene Houston who cosigned this note including patient's presenting symptoms, physical exam, and planned diagnostics and interventions. Attending physician stated agreement with plan or made changes to plan which were implemented.  ? ?Final Clinical Impression(s) / ED Diagnoses ?Final diagnoses:  ?Hematoma  ?Injury of left lower extremity, initial encounter  ? ? ?Rx / DC Orders ?ED Discharge Orders   ? ?      Ordered  ?  bacitracin ointment  2 times daily       ? 07/24/21 1354  ? ?  ?  ? ?  ? ?Portions of this report may have been transcribed using voice recognition software. Every effort was made to ensure accuracy; however, inadvertent computerized transcription errors may be present. ? ?  ?Kateri Plummer, PA-C ?07/24/21 1407 ? ?  ?Fredia Sorrow, MD ?07/28/21 2335 ? ?

## 2021-07-24 NOTE — Discharge Instructions (Addendum)
You are seen the emergency department today for left leg bruise. ? ?As we discussed it does not look infected.  Such a past for these to open on their own, which is why I am not draining anything from it today.  I do not want to risk introducing any kind of infection.  This will open on its own, and at that point we will be worried about infection.  I am prescribing you an antibiotic ointment, that you can place on the area once that has opened to prevent any infection. ? ?You can loosely wrapped it with gauze when you sleep at night, just in case it opens while you are sleeping.  But otherwise try not to leave it covered to avoid excess pressure on the area. ? ?As we discussed signs of infection free to look out for are if it is more red, swollen, tender, or draining any pus.  Regardless I want you to have this evaluated by your primary doctor.  I have attached pictures in your chart, for them to view. ? ?Continue to monitor how you're doing and return to the ER for new or worsening symptoms.  ?

## 2021-07-24 NOTE — ED Triage Notes (Signed)
Pt states something heavy fell on her left leg last night. Report big bruise on leg ?

## 2021-08-27 ENCOUNTER — Encounter (HOSPITAL_BASED_OUTPATIENT_CLINIC_OR_DEPARTMENT_OTHER): Payer: Medicare Other | Attending: General Surgery | Admitting: General Surgery

## 2021-08-27 DIAGNOSIS — W19XXXA Unspecified fall, initial encounter: Secondary | ICD-10-CM | POA: Insufficient documentation

## 2021-08-27 DIAGNOSIS — L97822 Non-pressure chronic ulcer of other part of left lower leg with fat layer exposed: Secondary | ICD-10-CM | POA: Diagnosis not present

## 2021-08-27 DIAGNOSIS — S8012XA Contusion of left lower leg, initial encounter: Secondary | ICD-10-CM | POA: Diagnosis not present

## 2021-08-27 DIAGNOSIS — I872 Venous insufficiency (chronic) (peripheral): Secondary | ICD-10-CM | POA: Insufficient documentation

## 2021-08-27 DIAGNOSIS — J449 Chronic obstructive pulmonary disease, unspecified: Secondary | ICD-10-CM | POA: Insufficient documentation

## 2021-08-27 DIAGNOSIS — I7300 Raynaud's syndrome without gangrene: Secondary | ICD-10-CM | POA: Diagnosis not present

## 2021-08-27 NOTE — Progress Notes (Signed)
KEHINDE, TOTZKE ([id number]) ?Visit Report for 08/27/2021 ?Chief Complaint Document Details ?Patient Name: Date of Service: ?Brandy Peterson, Brandy Peterson. 08/27/2021 8:00 A M ?Medical Record Number: [id number] ?Patient Account Number: [id number] ?Date of Birth/Sex: Treating RN: ?[birth year] (88 y.o. Brandy Peterson, Brandy Peterson ?Primary Care Provider: [name] Other Clinician: ?Referring Provider: ?Treating Provider/Extender: Fredirick Maudlin ?[name] ?Weeks in Treatment: 0 ?Information Obtained from: Patient ?Chief Complaint ?Patient is here for a wound on her left lower extremity which is been present for one month ?Electronic Signature(s) ?Signed: 08/27/2021 9:15:32 AM By: Fredirick Maudlin MD FACS ?Entered By: Fredirick Maudlin on 08/27/2021 09:15:32 ?-------------------------------------------------------------------------------- ?Debridement Details ?Patient Name: Date of Service: ?Brandy Peterson, Brandy Peterson. 08/27/2021 8:00 A M ?Medical Record Number: [id number] ?Patient Account Number: [id number] ?Date of Birth/Sex: Treating RN: ?[birth year] (88 y.o. F) Brandy Peterson, Brandy Peterson ?Primary Care Provider: [name] Other Clinician: ?Referring Provider: ?Treating Provider/Extender: Fredirick Maudlin ?[name] ?Weeks in Treatment: 0 ?Debridement Performed for Assessment: Wound #2 Left,Anterior Lower Leg ?Performed By: Physician Fredirick Maudlin, MD ?Debridement Type: Debridement ?Level of Consciousness (Pre-procedure): Awake and Alert ?Pre-procedure Verification/Time Out Yes - 08:44 ?Taken: ?Start Time: 08:44 ?Pain Control: Lidocaine 4% T opical Solution ?T Area Debrided (L x W): ?otal 8.9 (cm) x 4.8 (cm) = 42.72 (cm?) ?Tissue and other material debrided: Non-Viable, Eschar, [place], Subcutaneous, [place] ?Level: Skin/Subcutaneous Tissue ?Debridement Description: Excisional ?Instrument: Curette ?Bleeding: Minimum ?Hemostasis Achieved: Pressure ?End Time: 08:46 ?Procedural Pain: 0 ?Post Procedural Pain: 0 ?Response to Treatment: Procedure was tolerated  well ?Level of Consciousness (Post- Awake and Alert ?procedure): ?Post Debridement Measurements of Total Wound ?Length: (cm) 8.9 ?Width: (cm) 4.8 ?Depth: (cm) 0.2 ?Volume: (cm?) 6.71 ?Character of Wound/Ulcer Post Debridement: Improved ?Post Procedure Diagnosis ?Same as Pre-procedure ?Electronic Signature(s) ?Signed: 08/27/2021 9:23:06 AM By: Fredirick Maudlin MD FACS ?Signed: 08/27/2021 9:31:03 AM By: Dellie Catholic RN ?Entered By: Dellie Catholic on 08/27/2021 08:49:45 ?-------------------------------------------------------------------------------- ?HPI Details ?Patient Name: Date of Service: ?Brandy Peterson, Brandy Peterson. 08/27/2021 8:00 A M ?Medical Record Number: [id number] ?Patient Account Number: [id number] ?Date of Birth/Sex: Treating RN: ?28-Sep-1930 (88 y.o. Brandy Peterson, Brandy Peterson ?Primary Care Provider: [name] Other Clinician: ?Referring Provider: ?Treating Provider/Extender: Fredirick Maudlin ?[name] ?Weeks in Treatment: 0 ?History of Present Illness ?HPI Description: 10/17/15; this is a patient who has a history of chronic Venous insufficiency. Previously known to vascular surgery. She has had an ablation ?her left lesser saphenous vein in late 2016 and she had stab phlebectomy of residual varicosities in the left leg as well. She tells me in terms of wounds that ?she traumatized her anterior legs bilaterally on the bag collector of her 1 more last year which took a long period of time to heal. Her current wound happen ?when she dropped her billfold on the anterior leg roughly a month ago. Since then she has been using Neosporin Vaseline, nonstick pads and Kerlix wraps. Her ?ABIs are 1.3 on the left 1.27 on the right she does not appear to have significant arterial insufficiency. ?Patient is not a diabetic. She does have a history of Raynaud's phenomenon which is an isolated event not associated with any rheumatologic disease. ?10/24/15; patient tolerated last week's dressing well. No new complaints. Using Prisma  under Kerlix Coban ?10/31/15; patient continued to tolerate her dressing well which was Prisma Kerlix Coban wound has healed. This is her second go around with wounds on her legs. ?She apparently had wounds on by her bilateral anterior lower legs in December 16 after hitting them on a lawnmower bag.  She tells me she has below-knee ?pressure stockings at home but doesn't wear them during the summer. I am doubtful I am going to be able to talk her into that now. She has known severe ?venous insufficiency and has had ablations in the past ?READMISSION ?08/27/2021 ?This is an 88 year old woman with a past medical history significant for venous insufficiency, COPD, and Raynaud's phenomenon. She suffered a deep ?contusion to her left anterior tibial surface that resulted in a hematoma after an object fell from about 6 feet high and struck her in the leg. She presented to ?the emergency department on July 24, 2021. They did not evacuate the hematoma. They gave her bacitracin to apply. She has been doing so since that time. ?She is not a diabetic and does not smoke. The wound has developed a heavy layer of eschar over the surface. She is here today for further evaluation and ?management. She says that generally, it is not painful unless she is standing for prolonged periods of time. ABI in clinic today was noncompressible. ?Electronic Signature(s) ?Signed: 08/27/2021 9:17:27 AM By: Fredirick Maudlin MD FACS ?Entered By: Fredirick Maudlin on 08/27/2021 09:17:27 ?-------------------------------------------------------------------------------- ?Physical Exam Details ?Patient Name: Date of Service: ?Brandy Peterson, Brandy Peterson. 08/27/2021 8:00 A M ?Medical Record Number: [id number] ?Patient Account Number: [id number] ?Date of Birth/Sex: Treating RN: ?1930/07/25 (88 y.o. Brandy Peterson, Brandy Peterson ?Primary Care Provider: [name] Other Clinician: ?Referring Provider: ?Treating Provider/Extender: Fredirick Maudlin ?[name] ?Weeks in Treatment:  0 ?Constitutional ?She is hypertensive but asymptomatic.. . . . No acute distress. ?Respiratory ?Normal work of breathing on room air.Marland Kitchen ?Cardiovascular ?DP pulse is easily palpable on the left; I did not examine the right.. No significant peripheral edema.Marland Kitchen ?Notes ?08/27/2021: On the left anterior tibial surface, there is an oblong wound with heavy black eschar overlying the majority of the surface. The surrounding skin is ?intact. There is some yellow slough visible around the eschar. Once the eschar was removed, there was a thick layer of old hematoma, slough, and nonviable ?fat. No odor or significant drainage. ?Electronic Signature(s) ?Signed: 08/27/2021 9:20:46 AM By: Fredirick Maudlin MD FACS ?Entered By: Fredirick Maudlin on 08/27/2021 09:20:46 ?-------------------------------------------------------------------------------- ?Physician Orders Details ?Patient Name: ?Date of Service: ?Brandy Peterson, Brandy Peterson. 08/27/2021 8:00 A M ?Medical Record Number: [id number] ?Patient Account Number: [id number] ?Date of Birth/Sex: ?Treating RN: ?October 19, 1930 (60 y.o. F) Brandy Peterson, Brandy Peterson ?Primary Care Provider: [name] ?Other Clinician: ?Referring Provider: ?Treating Provider/Extender: Fredirick Maudlin ?[name] ?Weeks in Treatment: 0 ?Verbal / Phone Orders: No ?Diagnosis Coding ?ICD-10 Coding ?Code Description ?D62.229 Non-pressure chronic ulcer of other part of left lower leg with fat layer exposed ?S80.12XS Contusion of left lower leg, sequela ?J44.9 Chronic obstructive pulmonary disease, unspecified ?I87.2 Venous insufficiency (chronic) (peripheral) ?Follow-up Appointments ?ppointment in 1 week. - Dr Celine Ahr Room 3 Wednesday May 24 at 9:30AM ?Return A ?Bathing/ Shower/ Hygiene ?Other Bathing/Shower/Hygiene Orders/Instructions: - Please take sponge baths.Please do not get Left leg dressing wet. ?Edema Control - Lymphedema / SCD / Other ?Bilateral Lower Extremities ?Avoid standing for long periods of time. ?Moisturize legs  daily. ?Wound Treatment ?Wound #2 - Lower Leg Wound Laterality: Left, Anterior ?Cleanser: Soap and Water 1 x Per Week/30 Days ?Discharge Instructions: May shower and wash wound with dial antibacterial soap and water prior to dres

## 2021-08-27 NOTE — Progress Notes (Signed)
Brandy, Peterson ([id number]) ?Visit Report for 08/27/2021 ?Abuse Risk Screen Details ?Patient Name: Date of Service: ?Brandy Peterson, Brandy IL J. 08/27/2021 8:00 A M ?Medical Record Number: [id number] ?Patient Account Number: [id number] ?Date of Birth/Sex: Treating RN: ?[birth year] (88 y.o. F) Brandy Peterson ?Primary Care Brandy Peterson: Brandy Peterson Other Clinician: ?Referring Treshaun Peterson: ?Treating Brandy Peterson/Extender: Brandy Peterson ?Brandy Peterson ?Weeks in Treatment: 0 ?Abuse Risk Screen Items ?Answer ?ABUSE RISK SCREEN: ?Has anyone close to you tried to hurt or harm you recentlyo No ?Do you feel uncomfortable with anyone in your familyo No ?Has anyone forced you do things that you didnt want to doo No ?Electronic Signature(s) ?Signed: 08/27/2021 9:31:03 AM By: Dellie Catholic RN ?Entered By: Dellie Catholic on 08/27/2021 08:04:38 ?-------------------------------------------------------------------------------- ?Activities of Daily Living Details ?Patient Name: Date of Service: ?Brandy Peterson, Brandy IL J. 08/27/2021 8:00 A M ?Medical Record Number: [id number] ?Patient Account Number: [id number] ?Date of Birth/Sex: Treating RN: ?Nov 21, 1930 (6 y.o. F) Brandy Peterson ?Primary Care Brandy Peterson: Brandy Peterson Other Clinician: ?Referring Shataria Crist: ?Treating Brandy Peterson/Extender: Brandy Peterson ?Brandy Peterson ?Weeks in Treatment: 0 ?Activities of Daily Living Items ?Answer ?Activities of Daily Living (Please select one for each item) ?[place] ?T Medications ?ake Completely Able ?Use T elephone Completely Able ?Care for Appearance Completely Able ?Use T oilet Completely Able ?Bath / Shower Completely Able ?Dress Self Completely Able ?Feed Self Completely Able ?Walk Completely Able ?Get In / Out Bed Completely Able ?Housework Completely Able ?Prepare Meals Completely Able ?Handle Money Completely Able ?Shop for Self Completely Able ?Electronic Signature(s) ?Signed: 08/27/2021 9:31:03 AM By: Dellie Catholic RN ?Entered By: Dellie Catholic on  08/27/2021 08:05:11 ?-------------------------------------------------------------------------------- ?Education Screening Details ?Patient Name: ?Date of Service: ?Brandy Peterson, Brandy IL J. 08/27/2021 8:00 A M ?Medical Record Number: [id number] ?Patient Account Number: [id number] ?Date of Birth/Sex: ?Treating RN: ?Jan 03, 1931 (6 y.o. F) Brandy Peterson ?Primary Care Brandy Peterson: Brandy Peterson ?Other Clinician: ?Referring Brandy Peterson: ?Treating Yosiah Jasmin/Extender: Brandy Peterson ?Brandy Peterson ?Weeks in Treatment: 0 ?Primary Learner Assessed: Patient ?Learning Preferences/Education Level/Primary Language ?Learning Preference: Explanation, Demonstration, Printed Material ?Highest Education Level: College or Above ?Preferred Language: English ?Cognitive Barrier ?Language Barrier: No ?Translator Needed: No ?Memory Deficit: No ?Emotional Barrier: No ?Cultural/Religious Beliefs Affecting Medical Care: No ?Physical Barrier ?Impaired Vision: No ?Impaired Hearing: No ?Decreased Hand dexterity: No ?Knowledge/Comprehension ?Knowledge Level: High ?Comprehension Level: High ?Ability to understand written instructions: High ?Ability to understand verbal instructions: High ?Motivation ?Anxiety Level: Calm ?Cooperation: Cooperative ?Education Importance: Acknowledges Need ?Interest in Health Problems: Asks Questions ?Perception: Coherent ?Willingness to Engage in Self-Management High ?Activities: ?Readiness to Engage in Self-Management High ?Activities: ?Electronic Signature(s) ?Signed: 08/27/2021 9:31:03 AM By: Dellie Catholic RN ?Entered By: Dellie Catholic on 08/27/2021 08:06:42 ?-------------------------------------------------------------------------------- ?Fall Risk Assessment Details ?Patient Name: ?Date of Service: ?Brandy Peterson, Brandy IL J. 08/27/2021 8:00 A M ?Medical Record Number: [id number] ?Patient Account Number: [id number] ?Date of Birth/Sex: ?Treating RN: ?[birth year] (42 y.o. F) Brandy Peterson ?Primary Care Brandy Peterson: Brandy Peterson ?Other  Clinician: ?Referring Brandy Peterson: ?Treating Brandy Peterson/Extender: Brandy Peterson ?Brandy Peterson ?Weeks in Treatment: 0 ?Fall Risk Assessment Items ?Have you had 2 or more falls in the last 12 monthso 0 No ?Have you had any fall that resulted in injury in the last 12 monthso 0 No ?FALLS RISK SCREEN ?History of falling - immediate or within 3 months 0 No ?Secondary diagnosis (Do you have 2 or more medical diagnoseso) 0 No ?Ambulatory aid ?None/bed rest/wheelchair/nurse 0 No ?Crutches/cane/walker 0 No ?Furniture 0 No ?Intravenous therapy Access/Saline/Heparin Lock 0 No ?Gait/Transferring ?Normal/ bed rest/ wheelchair  0 No ?Weak (short steps with or without shuffle, stooped but able to lift head while walking, may seek 0 No ?support from furniture) ?Impaired (short steps with shuffle, may have difficulty arising from chair, head down, impaired 0 No ?balance) ?Mental Status ?Oriented to own ability 0 No ?Electronic Signature(s) ?Signed: 08/27/2021 9:31:03 AM By: Dellie Catholic RN ?Entered By: Dellie Catholic on 08/27/2021 08:07:43 ?-------------------------------------------------------------------------------- ?Foot Assessment Details ?Patient Name: ?Date of Service: ?Brandy Peterson, Brandy IL J. 08/27/2021 8:00 A M ?Medical Record Number: [id number] ?Patient Account Number: [id number] ?Date of Birth/Sex: ?Treating RN: ?June 10, 1930 (81 y.o. F) Brandy Peterson ?Primary Care Samiyah Stupka: Brandy Peterson ?Other Clinician: ?Referring Brandy Peterson: ?Treating Brandy Peterson/Extender: Brandy Peterson ?Brandy Peterson ?Weeks in Treatment: 0 ?Foot Assessment Items ?Site Locations ?+ = Sensation present, - = Sensation absent, C = Callus, U = Ulcer ?R = Redness, W = Warmth, M = Maceration, PU = Pre-ulcerative lesion ?F = Fissure, S = Swelling, D = Dryness ?Assessment ?Right: Left: ?Other Deformity: No No ?Prior Foot Ulcer: No No ?Prior Amputation: No No ?Charcot Joint: No No ?Ambulatory Status: Ambulatory Without Help ?Gait: Steady ?Electronic Signature(s) ?Signed:  08/27/2021 9:31:03 AM By: Dellie Catholic RN ?Entered By: Dellie Catholic on 08/27/2021 08:12:03 ?-------------------------------------------------------------------------------- ?Nutrition Risk Screening Details ?Patient Name: ?Date of Service: ?Brandy Peterson, Brandy IL J. 08/27/2021 8:00 A M ?Medical Record Number: [id number] ?Patient Account Number: [id number] ?Date of Birth/Sex: ?Treating RN: ?Nov 09, 1930 (40 y.o. F) Brandy Peterson ?Primary Care Adisa Litt: Brandy Peterson ?Other Clinician: ?Referring Sherryl Valido: ?Treating Mari Battaglia/Extender: Brandy Peterson ?Brandy Peterson ?Weeks in Treatment: 0 ?Height (in): 62 ?Weight (lbs): 109 ?Body Mass Index (BMI): 19.9 ?Nutrition Risk Screening Items ?Score Screening ?NUTRITION RISK SCREEN: ?I have an illness or condition that made me change the kind and/or amount of food I eat 0 No ?I eat fewer than two meals per day 0 No ?I eat few fruits and vegetables, or milk products 0 No ?I have three or more drinks of beer, liquor or wine almost every day 0 No ?I have tooth or mouth problems that make it hard for me to eat 0 No ?I don't always have enough money to buy the food I need 0 No ?I eat alone most of the time 0 No ?I take three or more different prescribed or over-the-counter drugs a day 0 No ?Without wanting to, I have lost or gained 10 pounds in the last six months 0 No ?I am not always physically able to shop, cook and/or feed myself 0 No ?Nutrition Protocols ?Good Risk Protocol 0 No interventions needed ?Moderate Risk Protocol ?High Risk Proctocol ?Risk Level: Good Risk ?Score: 0 ?Electronic Signature(s) ?Signed: 08/27/2021 9:31:03 AM By: Dellie Catholic RN ?Entered By: Dellie Catholic on 08/27/2021 08:08:12 ?

## 2021-08-28 NOTE — Progress Notes (Signed)
ADJA, RUFF ([id number]) Visit Report for 08/27/2021 Allergy List Details Patient Name: Date of Service: [place], [place] [name] 08/27/2021 8:00 A M Medical Record Number: [id number] Patient Account Number: [id number] Date of Birth/Sex: Treating RN: 1931-03-24 (88 y.o. [name] Primary Care Dahmir Epperly: [name] Other Clinician: Referring Ruhi Kopke: Treating Sindhu Nguyen/Extender: [name] in Treatment: 0 Allergies Active Allergies Iodinated Contrast Media Severity: Moderate Sulfa (Sulfonamide Antibiotics) Severity: Moderate adhesive tape Severity: Moderate Allergy Notes Electronic Signature(s) Signed: 08/27/2021 9:31:03 AM By: Dellie Catholic RN Entered By: Dellie Catholic on 08/27/2021 08:03:35 -------------------------------------------------------------------------------- Arrival Information Details Patient Name: Date of Service: [name], [place] J. 08/27/2021 8:00 A M Medical Record Number: [id number] Patient Account Number: [id number] Date of Birth/Sex: Treating RN: 1930-06-15 (88 y.o. [name] Primary Care Nika Yazzie: [name] Other Clinician: Referring Corvette Orser: Treating Shanyia Stines/Extender: [name] in Treatment: 0 Visit Information Patient Arrived: Ambulatory Arrival Time: 07:53 Accompanied By: self Transfer Assistance: None Patient Identification Verified: Yes Patient Has Alerts: Yes Patient Alerts: ABI L [place] History Since Last Visit Added or deleted any medications: No Any new allergies or adverse reactions: No Had a fall or experienced change in activities of daily living that may affect risk of falls: No Signs or symptoms of abuse/neglect since last visito No Hospitalized since last visit: No Implantable device outside of the clinic excluding cellular tissue based products placed in the center since last visit: No Has Dressing in Place as Prescribed: Yes Electronic Signature(s) Signed: 08/27/2021  9:31:03 AM By: Dellie Catholic RN Entered By: Dellie Catholic on 08/27/2021 08:40:34 -------------------------------------------------------------------------------- Clinic Level of Care Assessment Details Patient Name: Date of Service: [place], [place] J. 08/27/2021 8:00 A M Medical Record Number: [id number] Patient Account Number: [id number] Date of Birth/Sex: Treating RN: 01-09-1931 (88 y.o. [name] Primary Care Vena Bassinger: [name] Other Clinician: Referring Maliki Gignac: Treating Niko Jakel/Extender: [name] in Treatment: 0 Clinic Level of Care Assessment Items TOOL 1 Quantity Score X- 1 0 Use when EandM and Procedure is performed on INITIAL visit ASSESSMENTS - Nursing Assessment / Reassessment X- 1 20 General Physical Exam (combine w/ comprehensive assessment (listed just below) when performed on new pt. evals) X- 1 25 Comprehensive Assessment (HX, ROS, Risk Assessments, Wounds Hx, etc.) ASSESSMENTS - Wound and Skin Assessment / Reassessment X- 1 10 Dermatologic / Skin Assessment (not related to wound area) ASSESSMENTS - Ostomy and/or Continence Assessment and Care '[]'$  - 0 Incontinence Assessment and Management '[]'$  - 0 Ostomy Care Assessment and Management (repouching, etc.) PROCESS - Coordination of Care X - Simple Patient / Family Education for ongoing care 1 15 '[]'$  - 0 Complex (extensive) Patient / Family Education for ongoing care X- 1 10 Staff obtains Programmer, systems, Records, T Results / Process Orders est X- 1 10 Staff telephones HHA, Nursing Homes / Clarify orders / etc '[]'$  - 0 Routine Transfer to another Facility (non-emergent condition) '[]'$  - 0 Routine Hospital Admission (non-emergent condition) X- 1 15 New Admissions / Biomedical engineer / Ordering NPWT Apligraf, etc. , '[]'$  - 0 Emergency Hospital Admission (emergent condition) PROCESS - Special Needs '[]'$  - 0 Pediatric / Minor Patient Management '[]'$  - 0 Isolation Patient  Management '[]'$  - 0 Hearing / Language / Visual special needs '[]'$  - 0 Assessment of Community assistance (transportation, D/C planning, etc.) '[]'$  - 0 Additional assistance / Altered mentation '[]'$  - 0 Support Surface(s) Assessment (bed, cushion, seat, etc.) INTERVENTIONS - Miscellaneous '[]'$  - 0 External ear  exam '[]'$  - 0 Patient Transfer (multiple staff / Civil Service fast streamer / Similar devices) '[]'$  - 0 Simple Staple / Suture removal (25 or less) '[]'$  - 0 Complex Staple / Suture removal (26 or more) '[]'$  - 0 Hypo/Hyperglycemic Management (do not check if billed separately) X- 1 15 Ankle / Brachial Index (ABI) - do not check if billed separately Has the patient been seen at the hospital within the last three years: Yes Total Score: 120 Level Of Care: New/Established - Level 4 Electronic Signature(s) Signed: 08/27/2021 9:31:03 AM By: Dellie Catholic RN Entered By: Dellie Catholic on 08/27/2021 09:17:22 -------------------------------------------------------------------------------- Encounter Discharge Information Details Patient Name: Date of Service: [name], [place] J. 08/27/2021 8:00 A M Medical Record Number: [id number] Patient Account Number: [id number] Date of Birth/Sex: Treating RN: 10/26/1930 (88 y.o. [name] Primary Care [place]: [name] Other Clinician: Referring Anjelita Sheahan: Treating Trista Ciocca/Extender: [name] in Treatment: 0 Encounter Discharge Information Items Post Procedure Vitals Discharge Condition: Stable Temperature (F): 97.9 Ambulatory Status: Ambulatory Pulse (bpm): 89 Discharge Destination: Home Respiratory Rate (breaths/min): 18 Transportation: Private Auto Blood Pressure (mmHg): 163/77 Accompanied By: self Schedule Follow-up Appointment: Yes Clinical Summary of Care: Patient Declined Electronic Signature(s) Signed: 08/27/2021 9:31:03 AM By: Dellie Catholic RN Entered By: Dellie Catholic on 08/27/2021  09:19:41 -------------------------------------------------------------------------------- Lower Extremity Assessment Details Patient Name: Date of Service: [name], [place] J. 08/27/2021 8:00 A M Medical Record Number: [id number] Patient Account Number: [id number] Date of Birth/Sex: Treating RN: 11/11/1930 (88 y.o. [name] Primary Care Alexei Doswell: [name] Other Clinician: Referring Sheron Tallman: Treating Elaysia Devargas/Extender: [name] in Treatment: 0 Edema Assessment Assessed: [Left: No] [Right: No] Edema: [Left: Ye] [Right: s] Calf Left: Right: Point of Measurement: 25 cm From Medial Instep 29.5 cm Ankle Left: Right: Point of Measurement: 10 cm From Medial Instep 23.8 cm Knee To Floor Left: Right: From Medial Instep 39 cm Electronic Signature(s) Signed: 08/27/2021 9:31:03 AM By: Dellie Catholic RN Entered By: Dellie Catholic on 08/27/2021 08:19:31 -------------------------------------------------------------------------------- Multi Wound Chart Details Patient Name: Date of Service: [name], [place] J. 08/27/2021 8:00 A M Medical Record Number: [id number] Patient Account Number: [id number] Date of Birth/Sex: Treating RN: 22-Sep-1930 (88 y.o. Tonita Phoenix, [place] Primary Care Shi Blankenship: [name] Other Clinician: Referring Malosi Hemstreet: Treating Buffi Ewton/Extender: [name] in Treatment: 0 Vital Signs Height(in): 62 Pulse(bpm): 54 Weight(lbs): 109 Blood Pressure(mmHg): 163/77 Body Mass Index(BMI): 19.9 Temperature(F): 97.9 Respiratory Rate(breaths/min): 18 Photos: [N/A:N/A] Left, Anterior Lower Leg N/A N/A Wound Location: Hematoma N/A N/A Wounding Event: Trauma, Other N/A N/A Primary Etiology: Cataracts, Middle ear problems, N/A N/A Comorbid History: Chronic Obstructive Pulmonary Disease (COPD), Angina, Hypertension, Raynauds, Received Chemotherapy 07/24/2021 N/A N/A Date Acquired: 0 N/A N/A Weeks of  Treatment: Open N/A N/A Wound Status: No N/A N/A Wound Recurrence: 8.9x4.8x0.2 N/A N/A Measurements L x W x D (cm) 33.552 N/A N/A A (cm) : rea 6.71 N/A N/A Volume (cm) : 0.00% N/A N/A % Reduction in A rea: 0.00% N/A N/A % Reduction in Volume: Full Thickness Without Exposed N/A N/A Classification: Support Structures Medium N/A N/A Exudate A mount: Serosanguineous N/A N/A Exudate Type: red, brown N/A N/A Exudate Color: Small (1-33%) N/A N/A Granulation A mount: Red, Hyper-granulation, Friable N/A N/A Granulation Quality: Large (67-100%) N/A N/A Necrotic A mount: Eschar, Adherent Slough N/A N/A Necrotic Tissue: None N/A N/A Epithelialization: Debridement - Excisional N/A N/A Debridement: Pre-procedure Verification/Time Out 08:44 N/A N/A Taken: Lidocaine 4% Topical Solution N/A N/A Pain  Control: Necrotic/Eschar, Subcutaneous, N/A N/A Tissue Debrided: Slough Skin/Subcutaneous Tissue N/A N/A Level: 42.72 N/A N/A Debridement A (sq cm): rea Curette N/A N/A Instrument: Minimum N/A N/A Bleeding: Pressure N/A N/A Hemostasis Achieved: 0 N/A N/A Procedural Pain: 0 N/A N/A Post Procedural Pain: Debridement Treatment Response: Procedure was tolerated well N/A N/A Post Debridement Measurements L x 8.9x4.8x0.2 N/A N/A W x D (cm) 6.71 N/A N/A Post Debridement Volume: (cm) Debridement N/A N/A Procedures Performed: Treatment Notes Electronic Signature(s) Signed: 08/27/2021 9:15:20 AM By: Fredirick Maudlin MD FACS Signed: 08/28/2021 4:40:18 PM By: Rhae Hammock RN Entered By: Fredirick Maudlin on 08/27/2021 09:15:20 -------------------------------------------------------------------------------- Multi-Disciplinary Care Plan Details Patient Name: Date of Service: [name], [place] J. 08/27/2021 8:00 A M Medical Record Number: [id number] Patient Account Number: [id number] Date of Birth/Sex: Treating RN: 01-Feb-1931 (88 y.o. [name] Primary Care Johni Narine:  [name] Other Clinician: Referring Rohnan Bartleson: Treating Yamen Castrogiovanni/Extender: [name] in Treatment: 0 Active Inactive Wound/Skin Impairment Nursing Diagnoses: Impaired tissue integrity Goals: Patient/caregiver will verbalize understanding of skin care regimen Date Initiated: 08/27/2021 Target Resolution Date: 10/10/2021 Goal Status: Active Interventions: Assess ulceration(s) every visit Treatment Activities: Skin care regimen initiated : 08/27/2021 Notes: Electronic Signature(s) Signed: 08/27/2021 9:31:03 AM By: Dellie Catholic RN Entered By: Dellie Catholic on 08/27/2021 08:50:21 -------------------------------------------------------------------------------- Pain Assessment Details Patient Name: Date of Service: [name], [place] J. 08/27/2021 8:00 A M Medical Record Number: [id number] Patient Account Number: [id number] Date of Birth/Sex: Treating RN: 10-19-1930 (88 y.o. [name] Primary Care Shonda Mandarino: [name] Other Clinician: Referring Shahla Betsill: Treating Quincie Haroon/Extender: [name] in Treatment: 0 Active Problems Location of Pain Severity and Description of Pain Patient Has Paino No Site Locations Pain Management and Medication Current Pain Management: Electronic Signature(s) Signed: 08/27/2021 9:31:03 AM By: Dellie Catholic RN Entered By: Dellie Catholic on 08/27/2021 08:25:25 -------------------------------------------------------------------------------- Patient/Caregiver Education Details Patient Name: Date of Service: [name], [place] J. 5/17/2023andnbsp8:00 [place] Record Number: [id number] Patient Account Number: [id number] Date of Birth/Gender: Treating RN: [birth year] (88 y.o. [name] Primary Care Physician: [name] Other Clinician: Referring Physician: Treating Physician/Extender: [name] in Treatment: 0 Education Assessment Education Provided  To: Patient Education Topics Provided Wound/Skin Impairment: Methods: Explain/Verbal Responses: Return demonstration correctly Electronic Signature(s) Signed: 08/27/2021 9:31:03 AM By: Dellie Catholic RN Entered By: Dellie Catholic on 08/27/2021 08:50:33 -------------------------------------------------------------------------------- Wound Assessment Details Patient Name: Date of Service: [name], [place] J. 08/27/2021 8:00 A M Medical Record Number: [id number] Patient Account Number: [id number] Date of Birth/Sex: Treating RN: 1930/07/30 (88 y.o. [name] Primary Care Avary Pitsenbarger: [name] Other Clinician: Referring Teiana Hajduk: Treating Emanie Behan/Extender: [name] in Treatment: 0 Wound Status Wound Number: 2 Primary Trauma, Other Etiology: Wound Location: Left, Anterior Lower Leg Wound Open Wounding Event: Hematoma Status: Date Acquired: 07/24/2021 Comorbid Cataracts, Middle ear problems, Chronic Obstructive Pulmonary Weeks Of Treatment: 0 History: Disease (COPD), Angina, Hypertension, Raynauds, Received Clustered Wound: No Chemotherapy Photos Wound Measurements Length: (cm) 8.9 Width: (cm) 4.8 Depth: (cm) 0.2 Area: (cm) 33.552 Volume: (cm) 6.71 % Reduction in Area: 0% % Reduction in Volume: 0% Epithelialization: None Tunneling: No Undermining: No Wound Description Classification: Full Thickness Without Exposed Support Structures Exudate Amount: Medium Exudate Type: Serosanguineous Exudate Color: red, brown Foul Odor After Cleansing: No Slough/Fibrino Yes Wound Bed Granulation Amount: Small (1-33%) Granulation Quality: Red, Hyper-granulation, Friable Necrotic Amount: Large (67-100%) Necrotic Quality: Eschar, Adherent Slough Treatment Notes Wound #2 (Lower Leg) Wound Laterality:  Left, Anterior Cleanser Soap and Water Discharge Instruction: May shower and wash wound with dial antibacterial soap and water prior to dressing  change. Wound Cleanser Discharge Instruction: Cleanse the wound with wound cleanser prior to applying a clean dressing using gauze sponges, not tissue or cotton balls. Peri-Wound Care Topical Primary Dressing Hydrofera Blue Classic Foam, 4x4 in Discharge Instruction: Moisten with saline prior to applying to wound bed Secondary Dressing ABD Pad, 8x10 Discharge Instruction: Apply over primary dressing as directed. Woven Gauze Sponge, Non-Sterile 4x4 in Discharge Instruction: Apply over primary dressing as directed. Zetuvit Plus 4x8 in Discharge Instruction: Apply over primary dressing as directed. Secured With [employer] 4x5 (in/yd) Discharge Instruction: Secure with Coban as directed. Kerlix Roll Sterile, 4.5x3.1 (in/yd) Discharge Instruction: Secure with Kerlix as directed. 85M Medipore Soft Cloth Surgical T 2x10 (in/yd) ape Discharge Instruction: Secure with tape as directed. Compression Wrap Compression Stockings Add-Ons Electronic Signature(s) Signed: 08/27/2021 9:31:03 AM By: Dellie Catholic RN Entered By: Dellie Catholic on 08/27/2021 08:27:34 -------------------------------------------------------------------------------- Vitals Details Patient Name: Date of Service: [name], [place] J. 08/27/2021 8:00 A M Medical Record Number: [id number] Patient Account Number: [id number] Date of Birth/Sex: Treating RN: 1930/05/01 (88 y.o. [name] Primary Care Lerry Cordrey: [name] Other Clinician: Referring Avel Ogawa: Treating Mat Stuard/Extender: [name] in Treatment: 0 Vital Signs Time Taken: 07:59 Temperature (F): 97.9 Height (in): 62 Pulse (bpm): 89 Source: Stated Respiratory Rate (breaths/min): 18 Weight (lbs): 109 Blood Pressure (mmHg): 163/77 Source: Stated Reference Range: 80 - 120 mg / dl Body Mass Index (BMI): 19.9 Electronic Signature(s) Signed: 08/27/2021 9:31:03 AM By: Dellie Catholic RN Entered By: Dellie Catholic on 08/27/2021 08:00:27

## 2021-09-03 ENCOUNTER — Encounter (HOSPITAL_BASED_OUTPATIENT_CLINIC_OR_DEPARTMENT_OTHER): Payer: Medicare Other | Admitting: General Surgery

## 2021-09-03 DIAGNOSIS — J449 Chronic obstructive pulmonary disease, unspecified: Secondary | ICD-10-CM | POA: Diagnosis not present

## 2021-09-03 DIAGNOSIS — I7300 Raynaud's syndrome without gangrene: Secondary | ICD-10-CM | POA: Diagnosis not present

## 2021-09-03 DIAGNOSIS — I872 Venous insufficiency (chronic) (peripheral): Secondary | ICD-10-CM | POA: Diagnosis not present

## 2021-09-03 DIAGNOSIS — L97822 Non-pressure chronic ulcer of other part of left lower leg with fat layer exposed: Secondary | ICD-10-CM | POA: Diagnosis not present

## 2021-09-03 DIAGNOSIS — S8012XA Contusion of left lower leg, initial encounter: Secondary | ICD-10-CM | POA: Diagnosis not present

## 2021-09-09 NOTE — Progress Notes (Signed)
EVALYNN, HANKINS ([id number]) Visit Report for 09/03/2021 Arrival Information Details Patient Name: Date of Service: Brandy Peterson 09/03/2021 9:30 A M Medical Record Number: [id number] Patient Account Number: [id number] Date of Birth/Sex: Treating RN: [birth year] (88 y.o. Brandy Peterson Primary Care Ayliana Casciano: [name] Other Clinician: Referring Ellesse Antenucci: Treating Najia Hurlbutt/Extender: [name] in Treatment: 1 Visit Information History Since Last Visit Added or deleted any medications: No Patient Arrived: Ambulatory Any new allergies or adverse reactions: No Arrival Time: 09:21 Had a fall or experienced change in No Accompanied By: self activities of daily living that may affect Transfer Assistance: None risk of falls: Patient Identification Verified: Yes Signs or symptoms of abuse/neglect since last visito No Secondary Verification Process Completed: Yes Hospitalized since last visit: No Patient Has Alerts: Yes Implantable device outside of the clinic excluding No Patient Alerts: ABI L [place] cellular tissue based products placed in the center since last visit: Has Dressing in Place as Prescribed: Yes Has Compression in Place as Prescribed: Yes Pain Present Now: Yes Electronic Signature(s) Signed: 09/09/2021 4:30:31 PM By: Sharyn Creamer RN, BSN Entered By: Sharyn Creamer on 09/03/2021 09:25:42 -------------------------------------------------------------------------------- Encounter Discharge Information Details Patient Name: Date of Service: Brandy Peterson, Brandy J. 09/03/2021 9:30 A M Medical Record Number: [id number] Patient Account Number: [id number] Date of Birth/Sex: Treating RN: Mar 27, 1931 (88 y.o. Brandy Peterson Primary Care Meiko Ives: [name] Other Clinician: Referring Rana Hochstein: Treating Roschelle Calandra/Extender: [name] in Treatment: 1 Encounter Discharge Information Items Post Procedure Vitals Discharge Condition:  Stable Temperature (F): 97.7 Ambulatory Status: Ambulatory Pulse (bpm): 73 Discharge Destination: Home Respiratory Rate (breaths/min): 18 Transportation: Private Auto Blood Pressure (mmHg): 168/75 Accompanied By: self Schedule Follow-up Appointment: Yes Clinical Summary of Care: Patient Declined Electronic Signature(s) Signed: 09/09/2021 4:30:31 PM By: Sharyn Creamer RN, BSN Entered By: Sharyn Creamer on 09/03/2021 09:50:34 -------------------------------------------------------------------------------- Lower Extremity Assessment Details Patient Name: Date of Service: Brandy Peterson, [place] J. 09/03/2021 9:30 A M Medical Record Number: [id number] Patient Account Number: [id number] Date of Birth/Sex: Treating RN: [birth year] (88 y.o. Brandy Peterson Primary Care Maccoy Haubner: [name] Other Clinician: Referring Ilene Witcher: Treating Tujuana Kilmartin/Extender: [name] in Treatment: 1 Edema Assessment Assessed: [Left: No] [Right: No] Edema: [Left: Ye] [Right: s] Calf Left: Right: Point of Measurement: 25 cm From Medial Instep 32 cm Ankle Left: Right: Point of Measurement: 10 cm From Medial Instep 20.8 cm Vascular Assessment Pulses: Dorsalis Pedis Palpable: [Left:Yes] Electronic Signature(s) Signed: 09/09/2021 4:30:31 PM By: Sharyn Creamer RN, BSN Entered By: Sharyn Creamer on 09/03/2021 09:32:50 -------------------------------------------------------------------------------- Multi Wound Chart Details Patient Name: Date of Service: Brandy Peterson, [place] J. 09/03/2021 9:30 A M Medical Record Number: [id number] Patient Account Number: [id number] Date of Birth/Sex: Treating RN: Jul 18, 1930 (88 y.o. Brandy Peterson Primary Care Faylinn Schwenn: [name] Other Clinician: Referring Gaby Harney: Treating Bayler Gehrig/Extender: [name] in Treatment: 1 Vital Signs Height(in): 23 Pulse(bpm): 21 Weight(lbs): 109 Blood Pressure(mmHg): 168/75 Body Mass  Index(BMI): 19.9 Temperature(F): 97.7 Respiratory Rate(breaths/min): 18 Photos: [N/A:N/A] Left, Anterior Lower Leg N/A N/A Wound Location: Hematoma N/A N/A Wounding Event: Trauma, Other N/A N/A Primary Etiology: Cataracts, Middle ear problems, N/A N/A Comorbid History: Chronic Obstructive Pulmonary Disease (COPD), Angina, Hypertension, Raynauds, Received Chemotherapy 07/24/2021 N/A N/A Date Acquired: 1 N/A N/A Weeks of Treatment: Open N/A N/A Wound Status: No N/A N/A Wound Recurrence: 7.8x4.2x0.1 N/A N/A Measurements L x W x D (cm) 25.73 N/A N/A A (cm) : rea 2.573 N/A N/A Volume (  cm) : 23.30% N/A N/A % Reduction in A rea: 61.70% N/A N/A % Reduction in Volume: Full Thickness Without Exposed N/A N/A Classification: Support Structures Medium N/A N/A Exudate A mount: Serosanguineous N/A N/A Exudate Type: red, Peterson N/A N/A Exudate Color: Large (67-100%) N/A N/A Granulation A mount: Red, Hyper-granulation N/A N/A Granulation Quality: Small (1-33%) N/A N/A Necrotic A mount: Fat Layer (Subcutaneous Tissue): Yes N/A N/A Exposed Structures: Fascia: No Tendon: No Muscle: No Joint: No Bone: No Small (1-33%) N/A N/A Epithelialization: Debridement - Excisional N/A N/A Debridement: Pre-procedure Verification/Time Out 09:43 N/A N/A Taken: Other N/A N/A Pain Control: Subcutaneous, Slough N/A N/A Tissue Debrided: Skin/Subcutaneous Tissue N/A N/A Level: 15 N/A N/A Debridement A (sq cm): rea Curette N/A N/A Instrument: Minimum N/A N/A Bleeding: Pressure N/A N/A Hemostasis A chieved: 0 N/A N/A Procedural Pain: 0 N/A N/A Post Procedural Pain: Procedure was tolerated well N/A N/A Debridement Treatment Response: 7.8x4.2x0.1 N/A N/A Post Debridement Measurements L x W x D (cm) 2.573 N/A N/A Post Debridement Volume: (cm) Debridement N/A N/A Procedures Performed: Treatment Notes Wound #2 (Lower Leg) Wound Laterality: Left, Anterior Cleanser Soap  and Water Discharge Instruction: May shower and wash wound with dial antibacterial soap and water prior to dressing change. Wound Cleanser Discharge Instruction: Cleanse the wound with wound cleanser prior to applying a clean dressing using gauze sponges, not tissue or cotton balls. Peri-Wound Care Topical Primary Dressing Hydrofera Blue Classic Foam, 4x4 in Discharge Instruction: Moisten with saline prior to applying to wound bed Secondary Dressing ABD Pad, 8x10 Discharge Instruction: Apply over primary dressing as directed. Woven Gauze Sponge, Non-Sterile 4x4 in Discharge Instruction: Apply over primary dressing as directed. Zetuvit Plus 4x8 in Discharge Instruction: Apply over primary dressing as directed. Secured With [employer] 4x5 (in/yd) Discharge Instruction: Secure with Coban as directed. Kerlix Roll Sterile, 4.5x3.1 (in/yd) Discharge Instruction: Secure with Kerlix as directed. 31M Medipore Soft Cloth Surgical T 2x10 (in/yd) ape Discharge Instruction: Secure with tape as directed. Compression Wrap Compression Stockings Add-Ons Electronic Signature(s) Signed: 09/03/2021 9:58:51 AM By: Fredirick Maudlin MD FACS Signed: 09/04/2021 5:13:16 PM By: Dellie Catholic RN Entered By: Fredirick Maudlin on 09/03/2021 09:58:51 -------------------------------------------------------------------------------- Multi-Disciplinary Care Plan Details Patient Name: Date of Service: Brandy Peterson, [place] J. 09/03/2021 9:30 A M Medical Record Number: [id number] Patient Account Number: [id number] Date of Birth/Sex: Treating RN: Sep 29, 1930 (88 y.o. Brandy Peterson Primary Care Delois Tolbert: [name] Other Clinician: Referring Jex Strausbaugh: Treating Ashyia Schraeder/Extender: [name] in Treatment: 1 Active Inactive Wound/Skin Impairment Nursing Diagnoses: Impaired tissue integrity Goals: Patient/caregiver will verbalize understanding of skin care regimen Date  Initiated: 08/27/2021 Target Resolution Date: 10/10/2021 Goal Status: Active Interventions: Assess ulceration(s) every visit Treatment Activities: Skin care regimen initiated : 08/27/2021 Notes: Electronic Signature(s) Signed: 09/09/2021 4:30:31 PM By: Sharyn Creamer RN, BSN Entered By: Sharyn Creamer on 09/03/2021 09:37:08 -------------------------------------------------------------------------------- Pain Assessment Details Patient Name: Date of Service: Brandy Peterson, [place] J. 09/03/2021 9:30 A M Medical Record Number: [id number] Patient Account Number: [id number] Date of Birth/Sex: Treating RN: 1931-03-05 (88 y.o. Brandy Peterson Primary Care Lesa Vandall: [name] Other Clinician: Referring Taishawn Smaldone: Treating Nita Whitmire/Extender: [name] in Treatment: 1 Active Problems Location of Pain Severity and Description of Pain Patient Has Paino Yes Site Locations Rate the pain. Current Pain Level: 3 Worst Pain Level: 5 Character of Pain Describe the Pain: Burning Pain Management and Medication Current Pain Management: Electronic Signature(s) Signed: 09/09/2021 4:30:31 PM By: Sharyn Creamer RN, BSN Entered By:  Sharyn Creamer on 09/03/2021 09:26:56 -------------------------------------------------------------------------------- Patient/Caregiver Education Details Patient Name: Date of Service: Brandy Peterson, Brandy [place] J. 5/24/2023andnbsp9:30 A M Medical Record Number: [id number] Patient Account Number: [id number] Date of Birth/Gender: Treating RN: 09/20/1930 (88 y.o. Brandy Peterson Primary Care Physician: [name] Other Clinician: Referring Physician: Treating Physician/Extender: [name] in Treatment: 1 Education Assessment Education Provided To: Patient Education Topics Provided Wound/Skin Impairment: Methods: Explain/Verbal Responses: State content correctly Electronic Signature(s) Signed: 09/09/2021 4:30:31 PM By: Sharyn Creamer RN, BSN Entered By: Sharyn Creamer on 09/03/2021 09:37:35 -------------------------------------------------------------------------------- Wound Assessment Details Patient Name: Date of Service: Brandy Peterson, [place] J. 09/03/2021 9:30 A M Medical Record Number: [id number] Patient Account Number: [id number] Date of Birth/Sex: Treating RN: Jan 12, 1931 (88 y.o. Brandy Peterson Primary Care Gracie Gupta: [name] Other Clinician: Referring Avin Upperman: Treating Annarose Ouellet/Extender: [name] in Treatment: 1 Wound Status Wound Number: 2 Primary Trauma, Other Etiology: Wound Location: Left, Anterior Lower Leg Wound Open Wounding Event: Hematoma Status: Date Acquired: 07/24/2021 Comorbid Cataracts, Middle ear problems, Chronic Obstructive Pulmonary Weeks Of Treatment: 1 History: Disease (COPD), Angina, Hypertension, Raynauds, Received Clustered Wound: No Chemotherapy Photos Wound Measurements Length: (cm) 7.8 Width: (cm) 4.2 Depth: (cm) 0.1 Area: (cm) 25.73 Volume: (cm) 2.573 % Reduction in Area: 23.3% % Reduction in Volume: 61.7% Epithelialization: Small (1-33%) Tunneling: No Undermining: No Wound Description Classification: Full Thickness Without Exposed Support Structures Exudate Amount: Medium Exudate Type: Serosanguineous Exudate Color: red, Peterson Foul Odor After Cleansing: No Slough/Fibrino Yes Wound Bed Granulation Amount: Large (67-100%) Exposed Structure Granulation Quality: Red, Hyper-granulation Fascia Exposed: No Necrotic Amount: Small (1-33%) Fat Layer (Subcutaneous Tissue) Exposed: Yes Necrotic Quality: Adherent Slough Tendon Exposed: No Muscle Exposed: No Joint Exposed: No Bone Exposed: No Treatment Notes Wound #2 (Lower Leg) Wound Laterality: Left, Anterior Cleanser Soap and Water Discharge Instruction: May shower and wash wound with dial antibacterial soap and water prior to dressing change. Wound Cleanser Discharge  Instruction: Cleanse the wound with wound cleanser prior to applying a clean dressing using gauze sponges, not tissue or cotton balls. Peri-Wound Care Topical Primary Dressing Hydrofera Blue Classic Foam, 4x4 in Discharge Instruction: Moisten with saline prior to applying to wound bed Secondary Dressing ABD Pad, 8x10 Discharge Instruction: Apply over primary dressing as directed. Woven Gauze Sponge, Non-Sterile 4x4 in Discharge Instruction: Apply over primary dressing as directed. Zetuvit Plus 4x8 in Discharge Instruction: Apply over primary dressing as directed. Secured With [employer] 4x5 (in/yd) Discharge Instruction: Secure with Coban as directed. Kerlix Roll Sterile, 4.5x3.1 (in/yd) Discharge Instruction: Secure with Kerlix as directed. 44M Medipore Soft Cloth Surgical T 2x10 (in/yd) ape Discharge Instruction: Secure with tape as directed. Compression Wrap Compression Stockings Add-Ons Electronic Signature(s) Signed: 09/09/2021 4:30:31 PM By: Sharyn Creamer RN, BSN Entered By: Sharyn Creamer on 09/03/2021 09:35:13 -------------------------------------------------------------------------------- [place] Details Patient Name: Date of Service: Brandy Peterson, [place] J. 09/03/2021 9:30 A M Medical Record Number: [id number] Patient Account Number: [id number] Date of Birth/Sex: Treating RN: [birth year] (88 y.o. Brandy Peterson Primary Care Sairah Knobloch: [name] Other Clinician: Referring Sasuke Yaffe: Treating Zea Kostka/Extender: [name] in Treatment: 1 Vital Signs Time Taken: 09:25 Temperature (F): 97.7 Height (in): 62 Pulse (bpm): 73 Weight (lbs): 109 Respiratory Rate (breaths/min): 18 Body Mass Index (BMI): 19.9 Blood Pressure (mmHg): 168/75 Reference Range: 80 - 120 mg / dl Electronic Signature(s) Signed: 09/09/2021 4:30:31 PM By: Sharyn Creamer RN, BSN Entered By: Sharyn Creamer on 09/03/2021 09:36:33

## 2021-09-09 NOTE — Progress Notes (Signed)
Brandy Peterson ([id number]) Visit Report for 09/03/2021 Chief Complaint Document Details Patient Name: Date of Service: Brandy Peterson 09/03/2021 9:30 A M Medical Record Number: [id number] Patient Account Number: [id number] Date of Birth/Sex: Treating RN: Feb 03, 1931 (88 y.o. Brandy Peterson Primary Care Provider: [name] Other Clinician: Referring Provider: Treating Provider/Extender: [name] in Treatment: 1 Information Obtained from: Patient Chief Complaint Patient is here for a wound on her left lower extremity which is been present for one month Electronic Signature(s) Signed: 09/03/2021 9:58:56 AM By: Fredirick Maudlin MD FACS Entered By: Fredirick Maudlin on 09/03/2021 09:58:56 -------------------------------------------------------------------------------- Debridement Details Patient Name: Date of Service: Brandy Peterson, [name] 09/03/2021 9:30 A M Medical Record Number: [id number] Patient Account Number: [id number] Date of Birth/Sex: Treating RN: 02-26-1931 (88 y.o. Brandy Peterson, Brandy Peterson Primary Care Provider: [name] Other Clinician: Referring Provider: Treating Provider/Extender: [name] in Treatment: 1 Debridement Performed for Assessment: Wound #2 Left,Anterior Lower Leg Performed By: Physician Fredirick Maudlin, MD Debridement Type: Debridement Level of Consciousness (Pre-procedure): Awake and Alert Pre-procedure Verification/Time Out Yes - 09:43 Taken: Start Time: 09:43 Pain Control: Other : benzocaine 20% spray T Area Debrided (L x W): otal 5 (cm) x 3 (cm) = 15 (cm) Tissue and other material debrided: Viable, Non-Viable, Slough, Subcutaneous, Slough Level: Skin/Subcutaneous Tissue Debridement Description: Excisional Instrument: Curette Bleeding: Minimum Hemostasis Achieved: Pressure Procedural Pain: 0 Post Procedural Pain: 0 Response to Treatment: Procedure was tolerated well Level of Consciousness (Post-  Awake and Alert procedure): Post Debridement Measurements of Total Wound Length: (cm) 7.8 Width: (cm) 4.2 Depth: (cm) 0.1 Volume: (cm) 2.573 Character of Wound/Ulcer Post Debridement: Improved Post Procedure Diagnosis Same as Pre-procedure Electronic Signature(s) Signed: 09/03/2021 11:45:05 AM By: Fredirick Maudlin MD FACS Signed: 09/09/2021 4:30:31 PM By: Sharyn Creamer RN, BSN Entered By: Sharyn Creamer on 09/03/2021 09:47:09 -------------------------------------------------------------------------------- HPI Details Patient Name: Date of Service: Brandy Peterson, [place] J. 09/03/2021 9:30 A M Medical Record Number: [id number] Patient Account Number: [id number] Date of Birth/Sex: Treating RN: 04/23/1930 (87 y.o. Brandy Peterson Primary Care Provider: [name] Other Clinician: Referring Provider: Treating Provider/Extender: [name] in Treatment: 1 History of Present Illness HPI Description: 10/17/15; this is a patient who has a history of chronic Venous insufficiency. Previously known to vascular surgery. She has had an ablation her left lesser saphenous vein in late 2016 and she had stab phlebectomy of residual varicosities in the left leg as well. She tells me in terms of wounds that she traumatized her anterior legs bilaterally on the bag collector of her 1 more last year which took a long period of time to heal. Her current wound happen when she dropped her billfold on the anterior leg roughly a month ago. Since then she has been using Neosporin Vaseline, nonstick pads and Kerlix wraps. Her ABIs are 1.3 on the left 1.27 on the right she does not appear to have significant arterial insufficiency. Patient is not a diabetic. She does have a history of Raynaud's phenomenon which is an isolated event not associated with any rheumatologic disease. 10/24/15; patient tolerated last week's dressing well. No Brandy complaints. Using Prisma under Kerlix Coban 10/31/15; patient  continued to tolerate her dressing well which was Prisma Kerlix Coban wound has healed. This is her second go around with wounds on her legs. She apparently had wounds on by her bilateral anterior lower legs in December 16 after hitting them on a lawnmower bag. She tells  me she has below-knee pressure stockings at home but doesn't wear them during the summer. I am doubtful I am going to be able to talk her into that now. She has known severe venous insufficiency and has had ablations in the past READMISSION 08/27/2021 This is an 88 year old woman with a past medical history significant for venous insufficiency, COPD, and Raynaud's phenomenon. She suffered a deep contusion to her left anterior tibial surface that resulted in a hematoma after an object fell from about 6 feet high and struck her in the leg. She presented to the emergency department on July 24, 2021. They did not evacuate the hematoma. They gave her bacitracin to apply. She has been doing so since that time. She is not a diabetic and does not smoke. The wound has developed a heavy layer of eschar over the surface. She is here today for further evaluation and management. She says that generally, it is not painful unless she is standing for prolonged periods of time. ABI in clinic today was noncompressible. 09/03/2021: The wound looks very good today. The surface is clean with just a little bit of slough accumulation. There is a nice bed of granulation tissue on the surface. She has a bud of epithelialized tissue in the midportion of the wound. Electronic Signature(s) Signed: 09/03/2021 9:59:43 AM By: Fredirick Maudlin MD FACS Entered By: Fredirick Maudlin on 09/03/2021 09:59:43 -------------------------------------------------------------------------------- Physical Exam Details Patient Name: Date of Service: Brandy Peterson, [place] J. 09/03/2021 9:30 A M Medical Record Number: [id number] Patient Account Number: [id number] Date of Birth/Sex:  Treating RN: 01/22/1931 (88 y.o. Brandy Peterson Primary Care Provider: [name] Other Clinician: Referring Provider: Treating Provider/Extender: [name] in Treatment: 1 Constitutional Slightly hypertensive. . . . No acute distress. Respiratory Normal work of breathing on room air. Notes 09/03/2021: The wound is smaller today. The surface is clean with just a little bit of slough accumulation. There is a nice bed of granulation tissue on the surface. She has a bud of epithelialized tissue in the midportion of the wound. Electronic Signature(s) Signed: 09/03/2021 10:00:31 AM By: Fredirick Maudlin MD FACS Entered By: Fredirick Maudlin on 09/03/2021 10:00:31 -------------------------------------------------------------------------------- Physician Orders Details Patient Name: Date of Service: Brandy Peterson, [place] J. 09/03/2021 9:30 A M Medical Record Number: [id number] Patient Account Number: [id number] Date of Birth/Sex: Treating RN: Oct 07, 1930 (88 y.o. Brandy Peterson Primary Care Provider: [name] Other Clinician: Referring Provider: Treating Provider/Extender: [name] in Treatment: 1 Verbal / Phone Orders: No Diagnosis Coding ICD-10 Coding Code Description [phone] Non-pressure chronic ulcer of other part of left lower leg with fat layer exposed S80.12XS Contusion of left lower leg, sequela J44.9 Chronic obstructive pulmonary disease, unspecified I87.2 Venous insufficiency (chronic) (peripheral) Follow-up Appointments ppointment in 1 week. - Dr Celine Ahr Room 3 Wednesday May 31 at 8:45AM Return A Bathing/ Shower/ Hygiene Other Bathing/Shower/Hygiene Orders/Instructions: - Please take sponge baths.Please do not get Left leg dressing wet. Edema Control - Lymphedema / SCD / Other Bilateral Lower Extremities Avoid standing for long periods of time. Moisturize legs daily. Wound Treatment Wound #2 - Lower Leg Wound Laterality:  Left, Anterior Cleanser: Soap and Water 1 x Per Week/30 Days Discharge Instructions: May shower and wash wound with dial antibacterial soap and water prior to dressing change. Cleanser: Wound Cleanser 1 x Per Week/30 Days Discharge Instructions: Cleanse the wound with wound cleanser prior to applying a clean dressing using gauze sponges, not tissue or cotton Peterson. Prim Dressing:  Hydrofera Blue Classic Foam, 4x4 in 1 x Per Week/30 Days ary Discharge Instructions: Moisten with saline prior to applying to wound bed Secondary Dressing: ABD Pad, 8x10 1 x Per Week/30 Days Discharge Instructions: Apply over primary dressing as directed. Secondary Dressing: Woven Gauze Sponge, Non-Sterile 4x4 in 1 x Per Week/30 Days Discharge Instructions: Apply over primary dressing as directed. Secondary Dressing: Zetuvit Plus 4x8 in 1 x Per Week/30 Days Discharge Instructions: Apply over primary dressing as directed. Secured With: Coban Self-Adherent Wrap 4x5 (in/yd) 1 x Per Week/30 Days Discharge Instructions: Secure with Coban as directed. Secured With: [employer], 4.5x3.1 (in/yd) 1 x Per Week/30 Days Discharge Instructions: Secure with Kerlix as directed. Secured With: 79M Medipore Public affairs consultant Surgical T 2x10 (in/yd) ape 1 x Per Week/30 Days Discharge Instructions: Secure with tape as directed. Electronic Signature(s) Signed: 09/03/2021 10:00:41 AM By: Fredirick Maudlin MD FACS Entered By: Fredirick Maudlin on 09/03/2021 10:00:41 -------------------------------------------------------------------------------- Problem List Details Patient Name: Date of Service: Brandy Peterson, [place] J. 09/03/2021 9:30 A M Medical Record Number: [id number] Patient Account Number: [id number] Date of Birth/Sex: Treating RN: Sep 05, 1930 (88 y.o. Brandy Peterson Primary Care Provider: [name] Other Clinician: Referring Provider: Treating Provider/Extender: [name] in Treatment: 1 Active  Problems ICD-10 Encounter Code Description Active Date MDM Diagnosis [phone] Non-pressure chronic ulcer of other part of left lower leg with fat layer exposed5/17/2023 No Yes S80.12XS Contusion of left lower leg, sequela 08/27/2021 No Yes J44.9 Chronic obstructive pulmonary disease, unspecified 08/27/2021 No Yes I87.2 Venous insufficiency (chronic) (peripheral) 08/27/2021 No Yes Inactive Problems Resolved Problems Electronic Signature(s) Signed: 09/03/2021 9:58:44 AM By: Fredirick Maudlin MD FACS Entered By: Fredirick Maudlin on 09/03/2021 09:58:44 -------------------------------------------------------------------------------- Progress Note/History and Physical Details Patient Name: Date of Service: Brandy Peterson, Br[place] J. 09/03/2021 9:30 A M Medical Record Number: [id number] Patient Account Number: [id number] Date of Birth/Sex: Treating RN: 12/21/1930 (88 y.o. Brandy Peterson Primary Care Provider: [name] Other Clinician: Referring Provider: Treating Provider/Extender: [name] in Treatment: 1 Subjective Chief Complaint Information obtained from Patient Patient is here for a wound on her left lower extremity which is been present for one month History of Present Illness (HPI) 10/17/15; this is a patient who has a history of chronic Venous insufficiency. Previously known to vascular surgery. She has had an ablation her left lesser saphenous vein in late 2016 and she had stab phlebectomy of residual varicosities in the left leg as well. She tells me in terms of wounds that she traumatized her anterior legs bilaterally on the bag collector of her 1 more last year which took a long period of time to heal. Her current wound happen when she dropped her billfold on the anterior leg roughly a month ago. Since then she has been using Neosporin Vaseline, nonstick pads and Kerlix wraps. Her ABIs are 1.3 on the left 1.27 on the right she does not appear to have significant  arterial insufficiency. Patient is not a diabetic. She does have a history of Raynaud's phenomenon which is an isolated event not associated with any rheumatologic disease. 10/24/15; patient tolerated last week's dressing well. No Brandy complaints. Using Prisma under Kerlix Coban 10/31/15; patient continued to tolerate her dressing well which was Prisma Kerlix Coban wound has healed. This is her second go around with wounds on her legs. She apparently had wounds on by her bilateral anterior lower legs in December 16 after hitting them on a lawnmower bag. She tells me  she has below-knee pressure stockings at home but doesn't wear them during the summer. I am doubtful I am going to be able to talk her into that now. She has known severe venous insufficiency and has had ablations in the past READMISSION 08/27/2021 This is an 88 year old woman with a past medical history significant for venous insufficiency, COPD, and Raynaud's phenomenon. She suffered a deep contusion to her left anterior tibial surface that resulted in a hematoma after an object fell from about 6 feet high and struck her in the leg. She presented to the emergency department on July 24, 2021. They did not evacuate the hematoma. They gave her bacitracin to apply. She has been doing so since that time. She is not a diabetic and does not smoke. The wound has developed a heavy layer of eschar over the surface. She is here today for further evaluation and management. She says that generally, it is not painful unless she is standing for prolonged periods of time. ABI in clinic today was noncompressible. 09/03/2021: The wound looks very good today. The surface is clean with just a little bit of slough accumulation. There is a nice bed of granulation tissue on the surface. She has a bud of epithelialized tissue in the midportion of the wound. Patient History Information obtained from Patient, Chart. Family History Cancer - Mother, Heart Disease -  Mother,Father,Siblings, Hypertension - Mother,Father,Siblings, Thyroid Problems - Mother, No family history of Diabetes, Hereditary Spherocytosis, Kidney Disease, Lung Disease, Stroke, Tuberculosis. Social History Former smoker - quit in 1990, Marital Status - Widowed, Alcohol Use - Moderate - 3 glasses of wine weekly, Drug Use - No History, Caffeine Use - Daily - coffee. Medical History Eyes Patient has history of Cataracts Denies history of Glaucoma, Optic Neuritis Ear/Nose/Mouth/Throat Patient has history of Middle ear problems Denies history of Chronic sinus problems/congestion Hematologic/Lymphatic Denies history of Anemia, Hemophilia, Human Immunodeficiency Virus, Lymphedema, Sickle Cell Disease Respiratory Patient has history of Chronic Obstructive Pulmonary Disease (COPD) Denies history of Aspiration, Asthma, Pneumothorax, Sleep Apnea, Tuberculosis Cardiovascular Patient has history of Angina, Hypertension Denies history of Arrhythmia, Congestive Heart Failure, Coronary Artery Disease, Deep Vein Thrombosis, Hypotension, Myocardial Infarction, Peripheral Arterial Disease, Peripheral Venous Disease, Phlebitis, Vasculitis Gastrointestinal Denies history of Cirrhosis , Colitis, Crohnoos, Hepatitis A, Hepatitis B, Hepatitis C Endocrine Denies history of Type I Diabetes, Type II Diabetes Genitourinary Denies history of End Stage Renal Disease Immunological Patient has history of Raynaudoos Denies history of Lupus Erythematosus, Scleroderma Integumentary (Skin) Denies history of History of Burn Musculoskeletal Denies history of Gout, Rheumatoid Arthritis, Osteoarthritis, Osteomyelitis Neurologic Denies history of Dementia, Neuropathy, Quadriplegia, Paraplegia, Seizure Disorder Oncologic Patient has history of Received Chemotherapy - due to breast ca Denies history of Received Radiation Psychiatric Denies history of Anorexia/bulimia, Confinement  Anxiety Hospitalization/Surgery History - Bone spurs. - hysterectomy. - mastectomy. - back surgery. - back injury. Medical A Surgical History Notes nd Constitutional Symptoms (General Health) recent laser ablation of (L) small saphenous vein due to varicosities , thyroid disease , plantars fasciitis , h/o shingles Hematologic/Lymphatic hemachromatosis Respiratory h/o pneumonia Gastrointestinal irritable bowel syndrome , GERD , spastic colon Oncologic (R) breast cancer Objective Constitutional Slightly hypertensive. No acute distress. Vitals Time Taken: 9:25 AM, Height: 62 in, Weight: 109 lbs, BMI: 19.9, Temperature: 97.7 F, Pulse: 73 bpm, Respiratory Rate: 18 breaths/min, Blood Pressure: 168/75 mmHg. Respiratory Normal work of breathing on room air. General Notes: 09/03/2021: The wound is smaller today. The surface is clean with just a little bit of slough  accumulation. There is a nice bed of granulation tissue on the surface. She has a bud of epithelialized tissue in the midportion of the wound. Integumentary (Hair, Skin) Wound #2 status is Open. Original cause of wound was Hematoma. The date acquired was: 07/24/2021. The wound has been in treatment 1 weeks. The wound is located on the Left,Anterior Lower Leg. The wound measures 7.8cm length x 4.2cm width x 0.1cm depth; 25.73cm^2 area and 2.573cm^3 volume. There is Fat Layer (Subcutaneous Tissue) exposed. There is no tunneling or undermining noted. There is a medium amount of serosanguineous drainage noted. There is large (67-100%) red, hyper - granulation within the wound bed. There is a small (1-33%) amount of necrotic tissue within the wound bed including Adherent Slough. Assessment Active Problems ICD-10 Non-pressure chronic ulcer of other part of left lower leg with fat layer exposed Contusion of left lower leg, sequela Chronic obstructive pulmonary disease, unspecified Venous insufficiency (chronic)  (peripheral) Procedures Wound #2 Pre-procedure diagnosis of Wound #2 is a Trauma, Other located on the Left,Anterior Lower Leg . There was a Excisional Skin/Subcutaneous Tissue Debridement with a total area of 15 sq cm performed by Fredirick Maudlin, MD. With the following instrument(s): Curette to remove Viable and Non-Viable tissue/material. Material removed includes Subcutaneous Tissue and Slough and after achieving pain control using Other (benzocaine 20% spray). No specimens were taken. A time out was conducted at 09:43, prior to the start of the procedure. A Minimum amount of bleeding was controlled with Pressure. The procedure was tolerated well with a pain level of 0 throughout and a pain level of 0 following the procedure. Post Debridement Measurements: 7.8cm length x 4.2cm width x 0.1cm depth; 2.573cm^3 volume. Character of Wound/Ulcer Post Debridement is improved. Post procedure Diagnosis Wound #2: Same as Pre-Procedure Plan Follow-up Appointments: Return Appointment in 1 week. - Dr Celine Ahr Room 3 Wednesday May 31 at 8:45AM Bathing/ Shower/ Hygiene: Other Bathing/Shower/Hygiene Orders/Instructions: - Please take sponge baths.Please do not get Left leg dressing wet. Edema Control - Lymphedema / SCD / Other: Avoid standing for long periods of time. Moisturize legs daily. WOUND #2: - Lower Leg Wound Laterality: Left, Anterior Cleanser: Soap and Water 1 x Per Week/30 Days Discharge Instructions: May shower and wash wound with dial antibacterial soap and water prior to dressing change. Cleanser: Wound Cleanser 1 x Per Week/30 Days Discharge Instructions: Cleanse the wound with wound cleanser prior to applying a clean dressing using gauze sponges, not tissue or cotton Peterson. Prim Dressing: Hydrofera Blue Classic Foam, 4x4 in 1 x Per Week/30 Days ary Discharge Instructions: Moisten with saline prior to applying to wound bed Secondary Dressing: ABD Pad, 8x10 1 x Per Week/30  Days Discharge Instructions: Apply over primary dressing as directed. Secondary Dressing: Woven Gauze Sponge, Non-Sterile 4x4 in 1 x Per Week/30 Days Discharge Instructions: Apply over primary dressing as directed. Secondary Dressing: Zetuvit Plus 4x8 in 1 x Per Week/30 Days Discharge Instructions: Apply over primary dressing as directed. Secured With: Coban Self-Adherent Wrap 4x5 (in/yd) 1 x Per Week/30 Days Discharge Instructions: Secure with Coban as directed. Secured With: [employer], 4.5x3.1 (in/yd) 1 x Per Week/30 Days Discharge Instructions: Secure with Kerlix as directed. Secured With: 20M Medipore Public affairs consultant Surgical T 2x10 (in/yd) 1 x Per Week/30 Days ape Discharge Instructions: Secure with tape as directed. 09/03/2021: The wound is smaller today. The surface is clean with just a little bit of slough accumulation. There is a nice bed of granulation tissue on the surface. She has  a bud of epithelialized tissue in the midportion of the wound. I used a curette to debride the slough from the wound surface. We have made good progress over the past week. We will continue using the [facility] classic with Kerlix and Coban wraps. Follow-up in 1 week. Electronic Signature(s) Signed: 09/03/2021 10:01:12 AM By: Fredirick Maudlin MD FACS Entered By: Fredirick Maudlin on 09/03/2021 10:01:12 -------------------------------------------------------------------------------- HxROS Details Patient Name: Date of Service: Brandy Peterson, Brandy J. 09/03/2021 9:30 A M Medical Record Number: [id number] Patient Account Number: [id number] Date of Birth/Sex: Treating RN: 03-16-1931 (88 y.o. Brandy Peterson Primary Care Provider: [name] Other Clinician: Referring Provider: Treating Provider/Extender: [name] in Treatment: 1 Label Progress Note Print Version as History and Physical for this encounter Information Obtained From Patient Chart Constitutional Symptoms  (General Health) Medical History: Past Medical History Notes: recent laser ablation of (L) small saphenous vein due to varicosities , thyroid disease , plantars fasciitis , h/o shingles Eyes Medical History: Positive for: Cataracts Negative for: Glaucoma; Optic Neuritis Ear/Nose/Mouth/Throat Medical History: Positive for: Middle ear problems Negative for: Chronic sinus problems/congestion Hematologic/Lymphatic Medical History: Negative for: Anemia; Hemophilia; Human Immunodeficiency Virus; Lymphedema; Sickle Cell Disease Past Medical History Notes: hemachromatosis Respiratory Medical History: Positive for: Chronic Obstructive Pulmonary Disease (COPD) Negative for: Aspiration; Asthma; Pneumothorax; Sleep Apnea; Tuberculosis Past Medical History Notes: h/o pneumonia Cardiovascular Medical History: Positive for: Angina; Hypertension Negative for: Arrhythmia; Congestive Heart Failure; Coronary Artery Disease; Deep Vein Thrombosis; Hypotension; Myocardial Infarction; Peripheral Arterial Disease; Peripheral Venous Disease; Phlebitis; Vasculitis Gastrointestinal Medical History: Negative for: Cirrhosis ; Colitis; Crohns; Hepatitis A; Hepatitis B; Hepatitis C Past Medical History Notes: irritable bowel syndrome , GERD , spastic colon Endocrine Medical History: Negative for: Type I Diabetes; Type II Diabetes Genitourinary Medical History: Negative for: End Stage Renal Disease Immunological Medical History: Positive for: Raynauds Negative for: Lupus Erythematosus; Scleroderma Integumentary (Skin) Medical History: Negative for: History of Burn Musculoskeletal Medical History: Negative for: Gout; Rheumatoid Arthritis; Osteoarthritis; Osteomyelitis Neurologic Medical History: Negative for: Dementia; Neuropathy; Quadriplegia; Paraplegia; Seizure Disorder Oncologic Medical History: Positive for: Received Chemotherapy - due to breast ca Negative for: Received Radiation Past  Medical History Notes: (R) breast cancer Psychiatric Medical History: Negative for: Anorexia/bulimia; Confinement Anxiety HBO Extended History Items Eyes: Ear/Nose/Mouth/Throat: Cataracts Middle ear problems Immunizations Pneumococcal Vaccine: Received Pneumococcal Vaccination: Yes Received Pneumococcal Vaccination On or After 60th Birthday: Yes Tetanus Vaccine: Last tetanus shot: 04/05/2015 Implantable Devices No devices added Hospitalization / Surgery History Type of Hospitalization/Surgery Bone spurs hysterectomy mastectomy back surgery back injury Family and Social History Cancer: Yes - Mother; Diabetes: No; Heart Disease: Yes - Mother,Father,Siblings; Hereditary Spherocytosis: No; Hypertension: Yes - Mother,Father,Siblings; Kidney Disease: No; Lung Disease: No; Stroke: No; Thyroid Problems: Yes - Mother; Tuberculosis: No; Former smoker - quit in 1990; Marital Status - Widowed; Alcohol Use: Moderate - 3 glasses of wine weekly; Drug Use: No History; Caffeine Use: Daily - coffee; Financial Concerns: No; Food, Clothing or Shelter Needs: No; Support System Lacking: No; Transportation Concerns: No Electronic Signature(s) Signed: 09/03/2021 11:45:05 AM By: Fredirick Maudlin MD FACS Signed: 09/04/2021 5:13:16 PM By: Dellie Catholic RN Entered By: Fredirick Maudlin on 09/03/2021 09:59:48 -------------------------------------------------------------------------------- SuperBill Details Patient Name: Date of Service: Brandy Peterson, Brandy [place] J. 09/03/2021 Medical Record Number: [id number] Patient Account Number: [id number] Date of Birth/Sex: Treating RN: [birth year] (88 y.o. Brandy Peterson Primary Care Provider: [name] Other Clinician: Referring Provider: Treating Provider/Extender: [name] in Treatment: 1 Diagnosis Coding  ICD-10 Codes Code Description [phone] Non-pressure chronic ulcer of other part of left lower leg with fat layer exposed S80.12XS  Contusion of left lower leg, sequela J44.9 Chronic obstructive pulmonary disease, unspecified I87.2 Venous insufficiency (chronic) (peripheral) Facility Procedures CPT4 Code: [id number] Description: 92957 - DEB SUBQ TISSUE 20 SQ CM/< ICD-10 Diagnosis Description L97.822 Non-pressure chronic ulcer of other part of left lower leg with fat layer expo Modifier: sed Quantity: 1 Physician Procedures : CPT4 Code Description Modifier [id number] - WC PHYS LEVEL 3 - EST PT 25 ICD-10 Diagnosis Description L97.822 Non-pressure chronic ulcer of other part of left lower leg with fat layer exposed S80.12XS Contusion of left lower leg, sequela I87.2 Venous  insufficiency (chronic) (peripheral) J44.9 Chronic obstructive pulmonary disease, unspecified Quantity: 1 : [id number] 11042 - WC PHYS SUBQ TISS 20 SQ CM ICD-10 Diagnosis Description L97.822 Non-pressure chronic ulcer of other part of left lower leg with fat layer exposed Quantity: 1 Electronic Signature(s) Signed: 09/03/2021 10:01:29 AM By: Fredirick Maudlin MD FACS Entered By: Fredirick Maudlin on 09/03/2021 10:01:29

## 2021-09-10 ENCOUNTER — Encounter (HOSPITAL_BASED_OUTPATIENT_CLINIC_OR_DEPARTMENT_OTHER): Payer: Medicare Other | Admitting: General Surgery

## 2021-09-10 DIAGNOSIS — I872 Venous insufficiency (chronic) (peripheral): Secondary | ICD-10-CM | POA: Diagnosis not present

## 2021-09-10 DIAGNOSIS — S8012XA Contusion of left lower leg, initial encounter: Secondary | ICD-10-CM | POA: Diagnosis not present

## 2021-09-10 DIAGNOSIS — J449 Chronic obstructive pulmonary disease, unspecified: Secondary | ICD-10-CM | POA: Diagnosis not present

## 2021-09-10 DIAGNOSIS — I7300 Raynaud's syndrome without gangrene: Secondary | ICD-10-CM | POA: Diagnosis not present

## 2021-09-10 DIAGNOSIS — L97822 Non-pressure chronic ulcer of other part of left lower leg with fat layer exposed: Secondary | ICD-10-CM | POA: Diagnosis not present

## 2021-09-10 NOTE — Progress Notes (Signed)
LOLLIE, Brandy Peterson ([id number]) Visit Report for 09/10/2021 Arrival Information Details Patient Name: Date of Service: Brandy Peterson 09/10/2021 8:45 A M Medical Record Number: [id number] Patient Account Number: [id number] Date of Birth/Sex: Treating RN: Dec 13, 1930 (88 y.o. [name] Primary Care Laiken Nohr: [name] Other Clinician: Referring Jennesis Ramaswamy: Treating Toiya Morrish/Extender: [name] in Treatment: 2 Visit Information History Since Last Visit Added or deleted any medications: No Patient Arrived: Ambulatory Any new allergies or adverse reactions: No Arrival Time: 08:58 Had a fall or experienced change in No Accompanied By: self activities of daily living that may affect Transfer Assistance: None risk of falls: Patient Identification Verified: Yes Signs or symptoms of abuse/neglect since last visito No Secondary Verification Process Completed: Yes Hospitalized since last visit: No Patient Has Alerts: Yes Implantable device outside of the clinic excluding No Patient Alerts: ABI L [place] cellular tissue based products placed in the center since last visit: Has Dressing in Place as Prescribed: Yes Pain Present Now: Yes Electronic Signature(s) Signed: 09/10/2021 3:27:54 PM By: Sandre Kitty Entered By: Sandre Kitty on 09/10/2021 08:59:10 -------------------------------------------------------------------------------- Clinic Level of Care Assessment Details Patient Name: Date of Service: [place] Brandy Peterson [place] J. 09/10/2021 8:45 A M Medical Record Number: [id number] Patient Account Number: [id number] Date of Birth/Sex: Treating RN: 1930-05-16 (88 y.o. [name] Primary Care Bassy Fetterly: [name] Other Clinician: Referring Daiana Vitiello: Treating Gradie Ohm/Extender: [name] in Treatment: 2 Clinic Level of Care Assessment Items TOOL 4 Quantity Score X- 1 0 Use when only an EandM is performed on FOLLOW-UP  visit ASSESSMENTS - Nursing Assessment / Reassessment X- 1 10 Reassessment of Co-morbidities (includes updates in patient status) X- 1 5 Reassessment of Adherence to Treatment Plan ASSESSMENTS - Wound and Skin A ssessment / Reassessment X - Simple Wound Assessment / Reassessment - one wound 1 5 '[]'$  - 0 Complex Wound Assessment / Reassessment - multiple wounds '[]'$  - 0 Dermatologic / Skin Assessment (not related to wound area) ASSESSMENTS - Focused Assessment '[]'$  - 0 Circumferential Edema Measurements - multi extremities '[]'$  - 0 Nutritional Assessment / Counseling / Intervention '[]'$  - 0 Lower Extremity Assessment (monofilament, tuning fork, pulses) '[]'$  - 0 Peripheral Arterial Disease Assessment (using hand held doppler) ASSESSMENTS - Ostomy and/or Continence Assessment and Care '[]'$  - 0 Incontinence Assessment and Management '[]'$  - 0 Ostomy Care Assessment and Management (repouching, etc.) PROCESS - Coordination of Care X - Simple Patient / Family Education for ongoing care 1 15 '[]'$  - 0 Complex (extensive) Patient / Family Education for ongoing care X- 1 10 Staff obtains Programmer, systems, Records, T Results / Process Orders est X- 1 10 Staff telephones HHA, Nursing Homes / Clarify orders / etc '[]'$  - 0 Routine Transfer to another Facility (non-emergent condition) '[]'$  - 0 Routine Hospital Admission (non-emergent condition) '[]'$  - 0 New Admissions / Biomedical engineer / Ordering NPWT Apligraf, etc. , '[]'$  - 0 Emergency Hospital Admission (emergent condition) X- 1 10 Simple Discharge Coordination '[]'$  - 0 Complex (extensive) Discharge Coordination PROCESS - Special Needs '[]'$  - 0 Pediatric / Minor Patient Management '[]'$  - 0 Isolation Patient Management '[]'$  - 0 Hearing / Language / Visual special needs '[]'$  - 0 Assessment of Community assistance (transportation, D/C planning, etc.) '[]'$  - 0 Additional assistance / Altered mentation '[]'$  - 0 Support Surface(s) Assessment (bed, cushion, seat,  etc.) INTERVENTIONS - Wound Cleansing / Measurement X - Simple Wound Cleansing - one wound 1 5 '[]'$  - 0 Complex Wound Cleansing -  multiple wounds X- 1 5 Wound Imaging (photographs - any number of wounds) '[]'$  - 0 Wound Tracing (instead of photographs) X- 1 5 Simple Wound Measurement - one wound '[]'$  - 0 Complex Wound Measurement - multiple wounds INTERVENTIONS - Wound Dressings X - Small Wound Dressing one or multiple wounds 1 10 '[]'$  - 0 Medium Wound Dressing one or multiple wounds '[]'$  - 0 Large Wound Dressing one or multiple wounds '[]'$  - 0 Application of Medications - topical '[]'$  - 0 Application of Medications - injection INTERVENTIONS - Miscellaneous '[]'$  - 0 External ear exam '[]'$  - 0 Specimen Collection (cultures, biopsies, blood, body fluids, etc.) '[]'$  - 0 Specimen(s) / Culture(s) sent or taken to Lab for analysis '[]'$  - 0 Patient Transfer (multiple staff / Civil Service fast streamer / Similar devices) '[]'$  - 0 Simple Staple / Suture removal (25 or less) '[]'$  - 0 Complex Staple / Suture removal (26 or more) '[]'$  - 0 Hypo / Hyperglycemic Management (close monitor of Blood Glucose) '[]'$  - 0 Ankle / Brachial Index (ABI) - do not check if billed separately X- 1 5 Vital Signs Has the patient been seen at the hospital within the last three years: Yes Total Score: 95 Level Of Care: New/Established - Level 3 Electronic Signature(s) Signed: 09/10/2021 5:40:16 PM By: Dellie Catholic RN Entered By: Dellie Catholic on 09/10/2021 17:37:28 -------------------------------------------------------------------------------- Encounter Discharge Information Details Patient Name: Date of Service: Brandy [place] J. 09/10/2021 8:45 A M Medical Record Number: [id number] Patient Account Number: [id number] Date of Birth/Sex: Treating RN: 1931-03-20 (88 y.o. [name] Primary Care Anneli Bing: [name] Other Clinician: Referring Ellyce Lafevers: Treating Aleksei Goodlin/Extender: [name] in Treatment:  2 Encounter Discharge Information Items Discharge Condition: Stable Ambulatory Status: Ambulatory Discharge Destination: Home Transportation: Private Auto Accompanied By: self Schedule Follow-up Appointment: Yes Clinical Summary of Care: Patient Declined Electronic Signature(s) Signed: 09/10/2021 5:40:16 PM By: Dellie Catholic RN Entered By: Dellie Catholic on 09/10/2021 17:39:54 -------------------------------------------------------------------------------- Lower Extremity Assessment Details Patient Name: Date of Service: Brandy Peterson, [place] J. 09/10/2021 8:45 A M Medical Record Number: [id number] Patient Account Number: [id number] Date of Birth/Sex: Treating RN: [birth year] (88 y.o. [name] Primary Care Sumer Moorehouse: [name] Other Clinician: Referring Elianna Windom: Treating Staley Budzinski/Extender: [name] in Treatment: 2 Edema Assessment Assessed: [Left: No] [Right: No] Edema: [Left: Ye] [Right: s] Calf Left: Right: Point of Measurement: 25 cm From Medial Instep 32 cm Ankle Left: Right: Point of Measurement: 10 cm From Medial Instep 20.9 cm Knee To Floor Left: Right: From Medial Instep 41 cm Electronic Signature(s) Signed: 09/10/2021 5:40:16 PM By: Dellie Catholic RN Entered By: Dellie Catholic on 09/10/2021 09:23:13 -------------------------------------------------------------------------------- Multi Wound Chart Details Patient Name: Date of Service: Brandy [place] J. 09/10/2021 8:45 A M Medical Record Number: [id number] Patient Account Number: [id number] Date of Birth/Sex: Treating RN: 1930-11-23 (88 y.o. [name] Primary Care Lulubelle Simcoe: [name] Other Clinician: Referring Tyriana Helmkamp: Treating Daronte Shostak/Extender: [name] in Treatment: 2 Vital Signs Height(in): 61 Pulse(bpm): 57 Weight(lbs): 109 Blood Pressure(mmHg): 141/56 Body Mass Index(BMI): 19.9 Temperature(F): 98.1 Respiratory Rate(breaths/min):  18 Photos: [N/A:N/A] Left, Anterior Lower Leg N/A N/A Wound Location: Hematoma N/A N/A Wounding Event: Trauma, Other N/A N/A Primary Etiology: Cataracts, Middle ear problems, N/A N/A Comorbid History: Chronic Obstructive Pulmonary Disease (COPD), Angina, Hypertension, Raynauds, Received Chemotherapy 07/24/2021 N/A N/A Date Acquired: 2 N/A N/A Weeks of Treatment: Open N/A N/A Wound Status: No N/A N/A Wound Recurrence: 7.2x3x0.1 N/A N/A Measurements L  x W x D (cm) 16.965 N/A N/A A (cm) : rea 1.696 N/A N/A Volume (cm) : 49.40% N/A N/A % Reduction in Area: 74.70% N/A N/A % Reduction in Volume: Full Thickness Without Exposed N/A N/A Classification: Support Structures Medium N/A N/A Exudate Amount: Serosanguineous N/A N/A Exudate Type: red, brown N/A N/A Exudate Color: Large (67-100%) N/A N/A Granulation Amount: Red, Hyper-granulation N/A N/A Granulation Quality: Small (1-33%) N/A N/A Necrotic Amount: Fat Layer (Subcutaneous Tissue): Yes N/A N/A Exposed Structures: Fascia: No Tendon: No Muscle: No Joint: No Bone: No Small (1-33%) N/A N/A Epithelialization: Treatment Notes Electronic Signature(s) Signed: 09/10/2021 9:46:15 AM By: Fredirick Maudlin MD FACS Signed: 09/10/2021 5:40:16 PM By: Dellie Catholic RN Entered By: Fredirick Maudlin on 09/10/2021 09:46:15 -------------------------------------------------------------------------------- Multi-Disciplinary Care Plan Details Patient Name: Date of Service: Brandy [place] J. 09/10/2021 8:45 A M Medical Record Number: [id number] Patient Account Number: [id number] Date of Birth/Sex: Treating RN: [birth year] (88 y.o. [name] Primary Care Christi Wirick: [name] Other Clinician: Referring Lyam Provencio: Treating Franke Menter/Extender: [name] in Treatment: 2 Active Inactive Wound/Skin Impairment Nursing Diagnoses: Impaired tissue integrity Goals: Patient/caregiver will verbalize  understanding of skin care regimen Date Initiated: 08/27/2021 Target Resolution Date: 10/10/2021 Goal Status: Active Interventions: Assess ulceration(s) every visit Treatment Activities: Skin care regimen initiated : 08/27/2021 Notes: Electronic Signature(s) Signed: 09/10/2021 5:40:16 PM By: Dellie Catholic RN Entered By: Dellie Catholic on 09/10/2021 17:35:06 -------------------------------------------------------------------------------- Pain Assessment Details Patient Name: Date of Service: Brandy Peterson, [place] J. 09/10/2021 8:45 A M Medical Record Number: [id number] Patient Account Number: [id number] Date of Birth/Sex: Treating RN: 09/09/1930 (88 y.o. [name] Primary Care Ellajane Stong: [name] Other Clinician: Referring Esco Joslyn: Treating Cantrell Martus/Extender: [name] in Treatment: 2 Active Problems Location of Pain Severity and Description of Pain Patient Has Paino Yes Site Locations Rate the pain. Rate the pain. Current Pain Level: 7 Pain Management and Medication Current Pain Management: Electronic Signature(s) Signed: 09/10/2021 3:27:54 PM By: Sandre Kitty Signed: 09/10/2021 5:40:16 PM By: Dellie Catholic RN Entered By: Sandre Kitty on 09/10/2021 09:00:07 -------------------------------------------------------------------------------- Patient/Caregiver Education Details Patient Name: Date of Service: Brandy [place] J. 5/31/2023andnbsp8:45 A M Medical Record Number: [id number] Patient Account Number: [id number] Date of Birth/Gender: Treating RN: November 07, 1930 (88 y.o. [name] Primary Care Physician: [name] Other Clinician: Referring Physician: Treating Physician/Extender: [name] in Treatment: 2 Education Assessment Education Provided To: Patient Education Topics Provided Wound/Skin Impairment: Methods: Explain/Verbal Responses: Return demonstration correctly Electronic  Signature(s) Signed: 09/10/2021 5:40:16 PM By: Dellie Catholic RN Entered By: Dellie Catholic on 09/10/2021 17:35:22 -------------------------------------------------------------------------------- Wound Assessment Details Patient Name: Date of Service: Brandy Peterson, [place] J. 09/10/2021 8:45 A M Medical Record Number: [id number] Patient Account Number: [id number] Date of Birth/Sex: Treating RN: 1930/09/18 (88 y.o. [name] Primary Care Satrina Magallanes: [name] Other Clinician: Referring Fender Herder: Treating Edeline Greening/Extender: [name] in Treatment: 2 Wound Status Wound Number: 2 Primary Trauma, Other Etiology: Wound Location: Left, Anterior Lower Leg Wound Open Wounding Event: Hematoma Status: Date Acquired: 07/24/2021 Comorbid Cataracts, Middle ear problems, Chronic Obstructive Pulmonary Weeks Of Treatment: 2 History: Disease (COPD), Angina, Hypertension, Raynauds, Received Clustered Wound: No Chemotherapy Photos Wound Measurements Length: (cm) 7.2 Width: (cm) 3 Depth: (cm) 0.1 Area: (cm) 16.965 Volume: (cm) 1.696 % Reduction in Area: 49.4% % Reduction in Volume: 74.7% Epithelialization: Small (1-33%) Tunneling: No Undermining: No Wound Description Classification: Full Thickness Without Exposed Support Structures Exudate Amount: Medium Exudate Type: Serosanguineous  Exudate Color: red, brown Foul Odor After Cleansing: No Slough/Fibrino Yes Wound Bed Granulation Amount: Large (67-100%) Exposed Structure Granulation Quality: Red, Hyper-granulation Fascia Exposed: No Necrotic Amount: Small (1-33%) Fat Layer (Subcutaneous Tissue) Exposed: Yes Necrotic Quality: Adherent Slough Tendon Exposed: No Muscle Exposed: No Joint Exposed: No Bone Exposed: No Treatment Notes Wound #2 (Lower Leg) Wound Laterality: Left, Anterior Cleanser Soap and Water Discharge Instruction: May shower and wash wound with dial antibacterial soap and water prior to  dressing change. Wound Cleanser Discharge Instruction: Cleanse the wound with wound cleanser prior to applying a clean dressing using gauze sponges, not tissue or cotton balls. Peri-Wound Care Topical Primary Dressing Hydrofera Blue Classic Foam, 4x4 in Discharge Instruction: Moisten with saline prior to applying to wound bed Secondary Dressing ABD Pad, 8x10 Discharge Instruction: Apply over primary dressing as directed. Woven Gauze Sponge, Non-Sterile 4x4 in Discharge Instruction: Apply over primary dressing as directed. Zetuvit Plus 4x8 in Discharge Instruction: Apply over primary dressing as directed. Secured With [employer] 4x5 (in/yd) Discharge Instruction: Secure with Coban as directed. Kerlix Roll Sterile, 4.5x3.1 (in/yd) Discharge Instruction: Secure with Kerlix as directed. 76M Medipore Soft Cloth Surgical T 2x10 (in/yd) ape Discharge Instruction: Secure with tape as directed. Compression Wrap Compression Stockings Add-Ons Electronic Signature(s) Signed: 09/10/2021 5:40:16 PM By: Dellie Catholic RN Entered By: Dellie Catholic on 09/10/2021 09:19:06 -------------------------------------------------------------------------------- Vitals Details Patient Name: Date of Service: Brandy [place], [place] J. 09/10/2021 8:45 A M Medical Record Number: [id number] Patient Account Number: [id number] Date of Birth/Sex: Treating RN: [birth year] (88 y.o. [name] Primary Care Skylor Schnapp: [name] Other Clinician: Referring Dunia Pringle: Treating Huberta Tompkins/Extender: [name] in Treatment: 2 Vital Signs Time Taken: 08:59 Temperature (F): 98.1 Height (in): 62 Pulse (bpm): 66 Weight (lbs): 109 Respiratory Rate (breaths/min): 18 Body Mass Index (BMI): 19.9 Blood Pressure (mmHg): 141/56 Reference Range: 80 - 120 mg / dl Electronic Signature(s) Signed: 09/10/2021 3:27:54 PM By: Sandre Kitty Entered By: Sandre Kitty on 09/10/2021  08:59:42

## 2021-09-11 NOTE — Progress Notes (Signed)
Brandy Peterson, GERBER ([id number]) Visit Report for 09/10/2021 Chief Complaint Document Details Patient Name: Date of Service: HA Brandy Peterson 09/10/2021 8:45 A M Medical Record Number: [id number] Patient Account Number: [id number] Date of Birth/Sex: Treating RN: Jan 30, 1931 (88 y.o. Brandy Peterson Primary Care Provider: [name] Other Clinician: Referring Provider: Treating Provider/Extender: Brandy Peterson in Treatment: 2 Information Obtained from: Patient Chief Complaint Patient is here for a wound on her left lower extremity which is been present for one month Electronic Signature(s) Signed: 09/10/2021 9:46:20 AM By: Fredirick Maudlin MD FACS Entered By: Fredirick Maudlin on 09/10/2021 09:46:20 -------------------------------------------------------------------------------- HPI Details Patient Name: Date of Service: Brandy Peterson, [place] J. 09/10/2021 8:45 A M Medical Record Number: [id number] Patient Account Number: [id number] Date of Birth/Sex: Treating RN: 22-Aug-1930 (88 y.o. Brandy Peterson Primary Care Provider: [name] Other Clinician: Referring Provider: Treating Provider/Extender: Brandy Peterson in Treatment: 2 History of Present Illness HPI Description: 10/17/15; this is a patient who has a history of chronic Venous insufficiency. Previously known to vascular surgery. She has had an ablation her left lesser saphenous vein in late 2016 and she had stab phlebectomy of residual varicosities in the left leg as well. She tells me in terms of wounds that she traumatized her anterior legs bilaterally on the bag collector of her 1 more last year which took a long period of time to heal. Her current wound happen when she dropped her billfold on the anterior leg roughly a month ago. Since then she has been using Neosporin Vaseline, nonstick pads and Kerlix wraps. Her ABIs are 1.3 on the left 1.27 on the right she does not appear to have significant  arterial insufficiency. Patient is not a diabetic. She does have a history of Raynaud's phenomenon which is an isolated event not associated with any rheumatologic disease. 10/24/15; patient tolerated last week's dressing well. No new complaints. Using Prisma under Kerlix Coban 10/31/15; patient continued to tolerate her dressing well which was Prisma Kerlix Coban wound has healed. This is her second go around with wounds on her legs. She apparently had wounds on by her bilateral anterior lower legs in December 16 after hitting them on a lawnmower bag. She tells me she has below-knee pressure stockings at home but doesn't wear them during the summer. I am doubtful I am Peterson to be able to talk her into that now. She has known severe venous insufficiency and has had ablations in the past READMISSION 08/27/2021 This is an 88 year old woman with a past medical history significant for venous insufficiency, COPD, and Raynaud's phenomenon. She suffered a deep contusion to her left anterior tibial surface that resulted in a hematoma after an object fell from about 6 feet high and struck her in the leg. She presented to the emergency department on July 24, 2021. They did not evacuate the hematoma. They gave her bacitracin to apply. She has been doing so since that time. She is not a diabetic and does not smoke. The wound has developed a heavy layer of eschar over the surface. She is here today for further evaluation and management. She says that generally, it is not painful unless she is standing for prolonged periods of time. ABI in clinic today was noncompressible. 09/03/2021: The wound looks very good today. The surface is clean with just a little bit of slough accumulation. There is a nice bed of granulation tissue on the surface. She has a bud of epithelialized tissue in  the midportion of the wound. 09/10/2021: The wound continues to contract. The [place] of epithelialized tissue in the midportion of the  wound has expanded and there is a new epithelialized area arising just adjacent to it. The wound surface is clean without any slough accumulation. Good granulation tissue present. Electronic Signature(s) Signed: 09/10/2021 9:47:12 AM By: Fredirick Maudlin MD FACS Entered By: Fredirick Maudlin on 09/10/2021 09:47:12 -------------------------------------------------------------------------------- Physical Exam Details Patient Name: Date of Service: Brandy Peterson, [place] J. 09/10/2021 8:45 A M Medical Record Number: [id number] Patient Account Number: [id number] Date of Birth/Sex: Treating RN: June 25, 1930 (88 y.o. Brandy Peterson Primary Care Provider: [name] Other Clinician: Referring Provider: Treating Provider/Extender: Brandy Peterson in Treatment: 2 Constitutional . . . . No acute distress. Respiratory Normal work of breathing on room air. Notes 09/10/2021: The wound continues to contract. The [place] of epithelialized tissue in the midportion of the wound has expanded and there is a new epithelialized area arising just adjacent to it. The wound surface is clean without any slough accumulation. Good granulation tissue present. Electronic Signature(s) Signed: 09/10/2021 9:47:40 AM By: Fredirick Maudlin MD FACS Entered By: Fredirick Maudlin on 09/10/2021 09:47:39 -------------------------------------------------------------------------------- Physician Orders Details Patient Name: Date of Service: Brandy Peterson, [place] J. 09/10/2021 8:45 A M Medical Record Number: [id number] Patient Account Number: [id number] Date of Birth/Sex: Treating RN: 08/12/1930 (88 y.o. Brandy Peterson Primary Care Provider: [name] Other Clinician: Referring Provider: Treating Provider/Extender: Brandy Peterson in Treatment: 2 Verbal / Phone Orders: No Diagnosis Coding ICD-10 Coding Code Description [phone] Non-pressure chronic ulcer of other part of left lower leg with fat  layer exposed S80.12XS Contusion of left lower leg, sequela J44.9 Chronic obstructive pulmonary disease, unspecified I87.2 Venous insufficiency (chronic) (peripheral) Follow-up Appointments ppointment in 1 week. - Dr Celine Ahr Room 3 Return A Bathing/ Shower/ Hygiene Other Bathing/Shower/Hygiene Orders/Instructions: - Please take sponge baths.Please do not get Left leg dressing wet. Edema Control - Lymphedema / SCD / Other Bilateral Lower Extremities Avoid standing for long periods of time. Moisturize legs daily. Wound Treatment Wound #2 - Lower Leg Wound Laterality: Left, Anterior Cleanser: Soap and Water 1 x Per Week/30 Days Discharge Instructions: May shower and wash wound with dial antibacterial soap and water prior to dressing change. Cleanser: Wound Cleanser 1 x Per Week/30 Days Discharge Instructions: Cleanse the wound with wound cleanser prior to applying a clean dressing using gauze sponges, not tissue or cotton balls. Prim Dressing: Hydrofera Blue Classic Foam, 4x4 in 1 x Per Week/30 Days ary Discharge Instructions: Moisten with saline prior to applying to wound bed Secondary Dressing: ABD Pad, 8x10 1 x Per Week/30 Days Discharge Instructions: Apply over primary dressing as directed. Secondary Dressing: Woven Gauze Sponge, Non-Sterile 4x4 in 1 x Per Week/30 Days Discharge Instructions: Apply over primary dressing as directed. Secondary Dressing: Zetuvit Plus 4x8 in 1 x Per Week/30 Days Discharge Instructions: Apply over primary dressing as directed. Secured With: Coban Self-Adherent Wrap 4x5 (in/yd) 1 x Per Week/30 Days Discharge Instructions: Secure with Coban as directed. Secured With: [employer], 4.5x3.1 (in/yd) 1 x Per Week/30 Days Discharge Instructions: Secure with Kerlix as directed. Secured With: 29M Medipore Public affairs consultant Surgical T 2x10 (in/yd) 1 x Per Week/30 Days ape Discharge Instructions: Secure with tape as directed. Electronic Signature(s) Signed:  09/10/2021 9:47:55 AM By: Fredirick Maudlin MD FACS Entered By: Fredirick Maudlin on 09/10/2021 09:47:55 -------------------------------------------------------------------------------- Problem List Details Patient Name: Date of Service: HA [place], [place] J.  09/10/2021 8:45 A M Medical Record Number: [id number] Patient Account Number: [id number] Date of Birth/Sex: Treating RN: 10/03/1930 (88 y.o. Brandy Peterson Primary Care Provider: [name] Other Clinician: Referring Provider: Treating Provider/Extender: Brandy Peterson in Treatment: 2 Active Problems ICD-10 Encounter Code Description Active Date MDM Diagnosis [phone] Non-pressure chronic ulcer of other part of left lower leg with fat layer exposed5/17/2023 No Yes S80.12XS Contusion of left lower leg, sequela 08/27/2021 No Yes J44.9 Chronic obstructive pulmonary disease, unspecified 08/27/2021 No Yes I87.2 Venous insufficiency (chronic) (peripheral) 08/27/2021 No Yes Inactive Problems Resolved Problems Electronic Signature(s) Signed: 09/10/2021 9:46:05 AM By: Fredirick Maudlin MD FACS Entered By: Fredirick Maudlin on 09/10/2021 09:46:05 -------------------------------------------------------------------------------- Progress Note/History and Physical Details Patient Name: Date of Service: Brandy Peterson, [place] J. 09/10/2021 8:45 A M Medical Record Number: [id number] Patient Account Number: [id number] Date of Birth/Sex: Treating RN: 1931-02-08 (88 y.o. Brandy Peterson Primary Care Provider: [name] Other Clinician: Referring Provider: Treating Provider/Extender: Brandy Peterson in Treatment: 2 Subjective Chief Complaint Information obtained from Patient Patient is here for a wound on her left lower extremity which is been present for one month History of Present Illness (HPI) 10/17/15; this is a patient who has a history of chronic Venous insufficiency. Previously known to vascular surgery. She  has had an ablation her left lesser saphenous vein in late 2016 and she had stab phlebectomy of residual varicosities in the left leg as well. She tells me in terms of wounds that she traumatized her anterior legs bilaterally on the bag collector of her 1 more last year which took a long period of time to heal. Her current wound happen when she dropped her billfold on the anterior leg roughly a month ago. Since then she has been using Neosporin Vaseline, nonstick pads and Kerlix wraps. Her ABIs are 1.3 on the left 1.27 on the right she does not appear to have significant arterial insufficiency. Patient is not a diabetic. She does have a history of Raynaud's phenomenon which is an isolated event not associated with any rheumatologic disease. 10/24/15; patient tolerated last week's dressing well. No new complaints. Using Prisma under Kerlix Coban 10/31/15; patient continued to tolerate her dressing well which was Prisma Kerlix Coban wound has healed. This is her second go around with wounds on her legs. She apparently had wounds on by her bilateral anterior lower legs in December 16 after hitting them on a lawnmower bag. She tells me she has below-knee pressure stockings at home but doesn't wear them during the summer. I am doubtful I am Peterson to be able to talk her into that now. She has known severe venous insufficiency and has had ablations in the past READMISSION 08/27/2021 This is an 88 year old woman with a past medical history significant for venous insufficiency, COPD, and Raynaud's phenomenon. She suffered a deep contusion to her left anterior tibial surface that resulted in a hematoma after an object fell from about 6 feet high and struck her in the leg. She presented to the emergency department on July 24, 2021. They did not evacuate the hematoma. They gave her bacitracin to apply. She has been doing so since that time. She is not a diabetic and does not smoke. The wound has developed a heavy  layer of eschar over the surface. She is here today for further evaluation and management. She says that generally, it is not painful unless she is standing for prolonged periods of time. ABI in clinic today  was noncompressible. 09/03/2021: The wound looks very good today. The surface is clean with just a little bit of slough accumulation. There is a nice bed of granulation tissue on the surface. She has a bud of epithelialized tissue in the midportion of the wound. 09/10/2021: The wound continues to contract. The [place] of epithelialized tissue in the midportion of the wound has expanded and there is a new epithelialized area arising just adjacent to it. The wound surface is clean without any slough accumulation. Good granulation tissue present. Patient History Information obtained from Patient, Chart. Family History Cancer - Mother, Heart Disease - Mother,Father,Siblings, Hypertension - Mother,Father,Siblings, Thyroid Problems - Mother, No family history of Diabetes, Hereditary Spherocytosis, Kidney Disease, Lung Disease, Stroke, Tuberculosis. Social History Former smoker - quit in 1990, Marital Status - Widowed, Alcohol Use - Moderate - 3 glasses of wine weekly, Drug Use - No History, Caffeine Use - Daily - coffee. Medical History Eyes Patient has history of Cataracts Denies history of Glaucoma, Optic Neuritis Ear/Nose/Mouth/Throat Patient has history of Middle ear problems Denies history of Chronic sinus problems/congestion Hematologic/Lymphatic Denies history of Anemia, Hemophilia, Human Immunodeficiency Virus, Lymphedema, Sickle Cell Disease Respiratory Patient has history of Chronic Obstructive Pulmonary Disease (COPD) Denies history of Aspiration, Asthma, Pneumothorax, Sleep Apnea, Tuberculosis Cardiovascular Patient has history of Angina, Hypertension Denies history of Arrhythmia, Congestive Heart Failure, Coronary Artery Disease, Deep Vein Thrombosis, Hypotension, Myocardial  Infarction, Peripheral Arterial Disease, Peripheral Venous Disease, Phlebitis, Vasculitis Gastrointestinal Denies history of Cirrhosis , Colitis, Crohnoos, Hepatitis A, Hepatitis B, Hepatitis C Endocrine Denies history of Type I Diabetes, Type II Diabetes Genitourinary Denies history of End Stage Renal Disease Immunological Patient has history of Raynaudoos Denies history of Lupus Erythematosus, Scleroderma Integumentary (Skin) Denies history of History of Burn Musculoskeletal Denies history of Gout, Rheumatoid Arthritis, Osteoarthritis, Osteomyelitis Neurologic Denies history of Dementia, Neuropathy, Quadriplegia, Paraplegia, Seizure Disorder Oncologic Patient has history of Received Chemotherapy - due to breast ca Denies history of Received Radiation Psychiatric Denies history of Anorexia/bulimia, Confinement Anxiety Hospitalization/Surgery History - Bone spurs. - hysterectomy. - mastectomy. - back surgery. - back injury. Medical A Surgical History Notes nd Constitutional Symptoms (General Health) recent laser ablation of (L) small saphenous vein due to varicosities , thyroid disease , plantars fasciitis , h/o shingles Hematologic/Lymphatic hemachromatosis Respiratory h/o pneumonia Gastrointestinal irritable bowel syndrome , GERD , spastic colon Oncologic (R) breast cancer Objective Constitutional No acute distress. Vitals Time Taken: 8:59 AM, Height: 62 in, Weight: 109 lbs, BMI: 19.9, Temperature: 98.1 F, Pulse: 66 bpm, Respiratory Rate: 18 breaths/min, Blood Pressure: 141/56 mmHg. Respiratory Normal work of breathing on room air. General Notes: 09/10/2021: The wound continues to contract. The [place] of epithelialized tissue in the midportion of the wound has expanded and there is a new epithelialized area arising just adjacent to it. The wound surface is clean without any slough accumulation. Good granulation tissue present. Integumentary (Hair, Skin) Wound #2  status is Open. Original cause of wound was Hematoma. The date acquired was: 07/24/2021. The wound has been in treatment 2 weeks. The wound is located on the Left,Anterior Lower Leg. The wound measures 7.2cm length x 3cm width x 0.1cm depth; 16.965cm^2 area and 1.696cm^3 volume. There is Fat Layer (Subcutaneous Tissue) exposed. There is no tunneling or undermining noted. There is a medium amount of serosanguineous drainage noted. There is large (67-100%) red, hyper - granulation within the wound bed. There is a small (1-33%) amount of necrotic tissue within the wound bed including Adherent Slough. Assessment Active  Problems ICD-10 Non-pressure chronic ulcer of other part of left lower leg with fat layer exposed Contusion of left lower leg, sequela Chronic obstructive pulmonary disease, unspecified Venous insufficiency (chronic) (peripheral) Plan Follow-up Appointments: Return Appointment in 1 week. - Dr Celine Ahr Room 3 Bathing/ Shower/ Hygiene: Other Bathing/Shower/Hygiene Orders/Instructions: - Please take sponge baths.Please do not get Left leg dressing wet. Edema Control - Lymphedema / SCD / Other: Avoid standing for long periods of time. Moisturize legs daily. WOUND #2: - Lower Leg Wound Laterality: Left, Anterior Cleanser: Soap and Water 1 x Per Week/30 Days Discharge Instructions: May shower and wash wound with dial antibacterial soap and water prior to dressing change. Cleanser: Wound Cleanser 1 x Per Week/30 Days Discharge Instructions: Cleanse the wound with wound cleanser prior to applying a clean dressing using gauze sponges, not tissue or cotton balls. Prim Dressing: Hydrofera Blue Classic Foam, 4x4 in 1 x Per Week/30 Days ary Discharge Instructions: Moisten with saline prior to applying to wound bed Secondary Dressing: ABD Pad, 8x10 1 x Per Week/30 Days Discharge Instructions: Apply over primary dressing as directed. Secondary Dressing: Woven Gauze Sponge, Non-Sterile 4x4 in 1  x Per Week/30 Days Discharge Instructions: Apply over primary dressing as directed. Secondary Dressing: Zetuvit Plus 4x8 in 1 x Per Week/30 Days Discharge Instructions: Apply over primary dressing as directed. Secured With: Coban Self-Adherent Wrap 4x5 (in/yd) 1 x Per Week/30 Days Discharge Instructions: Secure with Coban as directed. Secured With: [employer], 4.5x3.1 (in/yd) 1 x Per Week/30 Days Discharge Instructions: Secure with Kerlix as directed. Secured With: 40M Medipore Public affairs consultant Surgical T 2x10 (in/yd) 1 x Per Week/30 Days ape Discharge Instructions: Secure with tape as directed. 09/10/2021: The wound continues to contract. The [place] of epithelialized tissue in the midportion of the wound has expanded and there is a new epithelialized area arising just adjacent to it. The wound surface is clean without any slough accumulation. Good granulation tissue present. No debridement was necessary today. We will continue using Hydrofera Blue with Kerlix and Coban. Follow-up in 1 week. Electronic Signature(s) Signed: 09/10/2021 9:48:15 AM By: Fredirick Maudlin MD FACS Entered By: Fredirick Maudlin on 09/10/2021 09:48:15 -------------------------------------------------------------------------------- HxROS Details Patient Name: Date of Service: Brandy Peterson, [place] J. 09/10/2021 8:45 A M Medical Record Number: [id number] Patient Account Number: [id number] Date of Birth/Sex: Treating RN: 03/23/1931 (88 y.o. Brandy Peterson Primary Care Provider: [name] Other Clinician: Referring Provider: Treating Provider/Extender: Brandy Peterson in Treatment: 2 Label Progress Note Print Version as History and Physical for this encounter Information Obtained From Patient Chart Constitutional Symptoms (General Health) Medical History: Past Medical History Notes: recent laser ablation of (L) small saphenous vein due to varicosities , thyroid disease , plantars fasciitis , h/o  shingles Eyes Medical History: Positive for: Cataracts Negative for: Glaucoma; Optic Neuritis Ear/Nose/Mouth/Throat Medical History: Positive for: Middle ear problems Negative for: Chronic sinus problems/congestion Hematologic/Lymphatic Medical History: Negative for: Anemia; Hemophilia; Human Immunodeficiency Virus; Lymphedema; Sickle Cell Disease Past Medical History Notes: hemachromatosis Respiratory Medical History: Positive for: Chronic Obstructive Pulmonary Disease (COPD) Negative for: Aspiration; Asthma; Pneumothorax; Sleep Apnea; Tuberculosis Past Medical History Notes: h/o pneumonia Cardiovascular Medical History: Positive for: Angina; Hypertension Negative for: Arrhythmia; Congestive Heart Failure; Coronary Artery Disease; Deep Vein Thrombosis; Hypotension; Myocardial Infarction; Peripheral Arterial Disease; Peripheral Venous Disease; Phlebitis; Vasculitis Gastrointestinal Medical History: Negative for: Cirrhosis ; Colitis; Crohns; Hepatitis A; Hepatitis B; Hepatitis C Past Medical History Notes: irritable bowel syndrome , GERD , spastic colon Endocrine Medical  History: Negative for: Type I Diabetes; Type II Diabetes Genitourinary Medical History: Negative for: End Stage Renal Disease Immunological Medical History: Positive for: Raynauds Negative for: Lupus Erythematosus; Scleroderma Integumentary (Skin) Medical History: Negative for: History of Burn Musculoskeletal Medical History: Negative for: Gout; Rheumatoid Arthritis; Osteoarthritis; Osteomyelitis Neurologic Medical History: Negative for: Dementia; Neuropathy; Quadriplegia; Paraplegia; Seizure Disorder Oncologic Medical History: Positive for: Received Chemotherapy - due to breast ca Negative for: Received Radiation Past Medical History Notes: (R) breast cancer Psychiatric Medical History: Negative for: Anorexia/bulimia; Confinement Anxiety HBO Extended History  Items Eyes: Ear/Nose/Mouth/Throat: Cataracts Middle ear problems Immunizations Pneumococcal Vaccine: Received Pneumococcal Vaccination: Yes Received Pneumococcal Vaccination On or After 60th Birthday: Yes Tetanus Vaccine: Last tetanus shot: 04/05/2015 Implantable Devices No devices added Hospitalization / Surgery History Type of Hospitalization/Surgery Bone spurs hysterectomy mastectomy back surgery back injury Family and Social History Cancer: Yes - Mother; Diabetes: No; Heart Disease: Yes - Mother,Father,Siblings; Hereditary Spherocytosis: No; Hypertension: Yes - Mother,Father,Siblings; Kidney Disease: No; Lung Disease: No; Stroke: No; Thyroid Problems: Yes - Mother; Tuberculosis: No; Former smoker - quit in 1990; Marital Status - Widowed; Alcohol Use: Moderate - 3 glasses of wine weekly; Drug Use: No History; Caffeine Use: Daily - coffee; Financial Concerns: No; Food, Clothing or Shelter Needs: No; Support System Lacking: No; Transportation Concerns: No Electronic Signature(s) Signed: 09/10/2021 12:04:28 PM By: Fredirick Maudlin MD FACS Signed: 09/10/2021 5:40:16 PM By: Dellie Catholic RN Entered By: Fredirick Maudlin on 09/10/2021 09:47:18 -------------------------------------------------------------------------------- SuperBill Details Patient Name: Date of Service: Brandy Peterson, [place] J. 09/10/2021 Medical Record Number: [id number] Patient Account Number: [id number] Date of Birth/Sex: Treating RN: 1930/07/23 (88 y.o. Brandy Peterson Primary Care Provider: [name] Other Clinician: Referring Provider: Treating Provider/Extender: Brandy Peterson in Treatment: 2 Diagnosis Coding ICD-10 Codes Code Description [phone] Non-pressure chronic ulcer of other part of left lower leg with fat layer exposed S80.12XS Contusion of left lower leg, sequela J44.9 Chronic obstructive pulmonary disease, unspecified I87.2 Venous insufficiency (chronic)  (peripheral) Facility Procedures CPT4 Code: [id number] Description: 99213 - WOUND CARE VISIT-LEV 3 EST PT Modifier: Quantity: 1 Physician Procedures : CPT4 Code Description Modifier [id number] - WC PHYS LEVEL 3 - EST PT ICD-10 Diagnosis Description L97.822 Non-pressure chronic ulcer of other part of left lower leg with fat layer exposed S80.12XS Contusion of left lower leg, sequela J44.9 Chronic  obstructive pulmonary disease, unspecified I87.2 Venous insufficiency (chronic) (peripheral) Quantity: 1 Electronic Signature(s) Signed: 09/10/2021 5:40:16 PM By: Dellie Catholic RN Signed: 09/11/2021 8:58:31 AM By: Fredirick Maudlin MD FACS Signed: 09/11/2021 8:58:31 AM By: Fredirick Maudlin MD FACS Previous Signature: 09/10/2021 9:50:04 AM Version By: Fredirick Maudlin MD FACS Entered By: Dellie Catholic on 09/10/2021 17:37:37

## 2021-09-17 ENCOUNTER — Encounter (HOSPITAL_BASED_OUTPATIENT_CLINIC_OR_DEPARTMENT_OTHER): Payer: Medicare Other | Attending: General Surgery | Admitting: General Surgery

## 2021-09-17 DIAGNOSIS — L97822 Non-pressure chronic ulcer of other part of left lower leg with fat layer exposed: Secondary | ICD-10-CM | POA: Diagnosis not present

## 2021-09-17 DIAGNOSIS — I7300 Raynaud's syndrome without gangrene: Secondary | ICD-10-CM | POA: Diagnosis not present

## 2021-09-17 DIAGNOSIS — Z853 Personal history of malignant neoplasm of breast: Secondary | ICD-10-CM | POA: Diagnosis not present

## 2021-09-17 DIAGNOSIS — S8012XS Contusion of left lower leg, sequela: Secondary | ICD-10-CM | POA: Insufficient documentation

## 2021-09-17 DIAGNOSIS — X58XXXS Exposure to other specified factors, sequela: Secondary | ICD-10-CM | POA: Diagnosis not present

## 2021-09-17 DIAGNOSIS — Z87891 Personal history of nicotine dependence: Secondary | ICD-10-CM | POA: Diagnosis not present

## 2021-09-17 DIAGNOSIS — J449 Chronic obstructive pulmonary disease, unspecified: Secondary | ICD-10-CM | POA: Diagnosis not present

## 2021-09-17 DIAGNOSIS — I872 Venous insufficiency (chronic) (peripheral): Secondary | ICD-10-CM | POA: Diagnosis not present

## 2021-09-17 DIAGNOSIS — Z9221 Personal history of antineoplastic chemotherapy: Secondary | ICD-10-CM | POA: Insufficient documentation

## 2021-09-17 NOTE — Progress Notes (Signed)
RHEANNON, CERNEY ([id number]) Visit Report for 09/17/2021 Arrival Information Details Patient Name: Date of Service: [name], [place]. 09/17/2021 11:00 A M Medical Record Number: [id number] Patient Account Number: [id number] Date of Birth/Sex: Treating RN: [birth year] (88 y.o. [name] Primary Care Israel Werts: [name] Other Clinician: Referring Graeson Nouri: Treating Bodee Lafoe/Extender: [name] in Treatment: 3 Visit Information History Since Last Visit Added or deleted any medications: No Patient Arrived: Ambulatory Any new allergies or adverse reactions: No Arrival Time: 11:18 Had a fall or experienced change in No Accompanied By: self activities of daily living that may affect Transfer Assistance: None risk of falls: Patient Identification Verified: Yes Signs or symptoms of abuse/neglect since last visito No Patient Has Alerts: Yes Hospitalized since last visit: No Patient Alerts: ABI [place] Implantable device outside of the clinic excluding No cellular tissue based products placed in the center since last visit: Has Dressing in Place as Prescribed: Yes Has Compression in Place as Prescribed: Yes Pain Present Now: No Electronic Signature(s) Signed: 09/17/2021 5:15:35 PM By: Dellie Catholic RN Entered By: Dellie Catholic on 09/17/2021 11:18:44 -------------------------------------------------------------------------------- Clinic Level of Care Assessment Details Patient Name: Date of Service: [place], [place]. 09/17/2021 11:00 A M Medical Record Number: [id number] Patient Account Number: [id number] Date of Birth/Sex: Treating RN: 1930/11/19 (88 y.o. [name] Primary Care Francia Verry: [name] Other Clinician: Referring Camil Hausmann: Treating Larrie Lucia/Extender: [name] in Treatment: 3 Clinic Level of Care Assessment Items TOOL 4 Quantity Score X- 1 0 Use when only an EandM is performed on FOLLOW-UP visit ASSESSMENTS -  Nursing Assessment / Reassessment X- 1 10 Reassessment of Co-morbidities (includes updates in patient status) X- 1 5 Reassessment of Adherence to Treatment Plan ASSESSMENTS - Wound and Skin A ssessment / Reassessment X - Simple Wound Assessment / Reassessment - one wound 1 5 '[]'$  - 0 Complex Wound Assessment / Reassessment - multiple wounds '[]'$  - 0 Dermatologic / Skin Assessment (not related to wound area) ASSESSMENTS - Focused Assessment X- 1 5 Circumferential Edema Measurements - multi extremities '[]'$  - 0 Nutritional Assessment / Counseling / Intervention '[]'$  - 0 Lower Extremity Assessment (monofilament, tuning fork, pulses) '[]'$  - 0 Peripheral Arterial Disease Assessment (using hand held doppler) ASSESSMENTS - Ostomy and/or Continence Assessment and Care '[]'$  - 0 Incontinence Assessment and Management '[]'$  - 0 Ostomy Care Assessment and Management (repouching, etc.) PROCESS - Coordination of Care X - Simple Patient / Family Education for ongoing care 1 15 '[]'$  - 0 Complex (extensive) Patient / Family Education for ongoing care X- 1 10 Staff obtains Programmer, systems, Records, T Results / Process Orders est X- 1 10 Staff telephones HHA, Nursing Homes / Clarify orders / etc '[]'$  - 0 Routine Transfer to another Facility (non-emergent condition) '[]'$  - 0 Routine Hospital Admission (non-emergent condition) '[]'$  - 0 New Admissions / Biomedical engineer / Ordering NPWT Apligraf, etc. , '[]'$  - 0 Emergency Hospital Admission (emergent condition) X- 1 10 Simple Discharge Coordination '[]'$  - 0 Complex (extensive) Discharge Coordination PROCESS - Special Needs '[]'$  - 0 Pediatric / Minor Patient Management '[]'$  - 0 Isolation Patient Management '[]'$  - 0 Hearing / Language / Visual special needs '[]'$  - 0 Assessment of Community assistance (transportation, D/C planning, etc.) '[]'$  - 0 Additional assistance / Altered mentation '[]'$  - 0 Support Surface(s) Assessment (bed, cushion, seat, etc.) INTERVENTIONS -  Wound Cleansing / Measurement X - Simple Wound Cleansing - one wound 1 5 '[]'$  - 0 Complex  Wound Cleansing - multiple wounds X- 1 5 Wound Imaging (photographs - any number of wounds) '[]'$  - 0 Wound Tracing (instead of photographs) X- 1 5 Simple Wound Measurement - one wound '[]'$  - 0 Complex Wound Measurement - multiple wounds INTERVENTIONS - Wound Dressings X - Small Wound Dressing one or multiple wounds 1 10 '[]'$  - 0 Medium Wound Dressing one or multiple wounds '[]'$  - 0 Large Wound Dressing one or multiple wounds '[]'$  - 0 Application of Medications - topical '[]'$  - 0 Application of Medications - injection INTERVENTIONS - Miscellaneous '[]'$  - 0 External ear exam '[]'$  - 0 Specimen Collection (cultures, biopsies, blood, body fluids, etc.) '[]'$  - 0 Specimen(s) / Culture(s) sent or taken to Lab for analysis '[]'$  - 0 Patient Transfer (multiple staff / Civil Service fast streamer / Similar devices) '[]'$  - 0 Simple Staple / Suture removal (25 or less) '[]'$  - 0 Complex Staple / Suture removal (26 or more) '[]'$  - 0 Hypo / Hyperglycemic Management (close monitor of Blood Glucose) '[]'$  - 0 Ankle / Brachial Index (ABI) - do not check if billed separately X- 1 5 Vital Signs Has the patient been seen at the hospital within the last three years: Yes Total Score: 100 Level Of Care: New/Established - Level 3 Electronic Signature(s) Signed: 09/17/2021 5:15:35 PM By: Dellie Catholic RN Entered By: Dellie Catholic on 09/17/2021 17:14:17 -------------------------------------------------------------------------------- Encounter Discharge Information Details Patient Name: Date of Service: [name], [place] J. 09/17/2021 11:00 A M Medical Record Number: [id number] Patient Account Number: [id number] Date of Birth/Sex: Treating RN: 12-26-1930 (88 y.o. [name] Primary Care Manu Rubey: [name] Other Clinician: Referring Zaynah Chawla: Treating Sage Hammill/Extender: [name] in Treatment: 3 Encounter Discharge  Information Items Discharge Condition: Stable Ambulatory Status: Ambulatory Discharge Destination: Home Transportation: Private Auto Accompanied By: self Schedule Follow-up Appointment: Yes Clinical Summary of Care: Patient Declined Electronic Signature(s) Signed: 09/17/2021 5:15:35 PM By: Dellie Catholic RN Entered By: Dellie Catholic on 09/17/2021 17:14:58 -------------------------------------------------------------------------------- Lower Extremity Assessment Details Patient Name: Date of Service: [name], [place] J. 09/17/2021 11:00 A M Medical Record Number: [id number] Patient Account Number: [id number] Date of Birth/Sex: Treating RN: 03-23-1931 (88 y.o. [name] Primary Care Kasie Leccese: [name] Other Clinician: Referring Rosalin Buster: Treating Madesyn Ast/Extender: [name] in Treatment: 3 Edema Assessment Assessed: [Left: No] [Right: No] Edema: [Left: Ye] [Right: s] Calf Left: Right: Point of Measurement: 25 cm From Medial Instep 32 cm Ankle Left: Right: Point of Measurement: 10 cm From Medial Instep 20.9 cm Electronic Signature(s) Signed: 09/17/2021 5:15:35 PM By: Dellie Catholic RN Entered By: Dellie Catholic on 09/17/2021 11:27:36 -------------------------------------------------------------------------------- Multi Wound Chart Details Patient Name: Date of Service: [name], [place] J. 09/17/2021 11:00 A M Medical Record Number: [id number] Patient Account Number: [id number] Date of Birth/Sex: Treating RN: 1930-07-13 (88 y.o. [name] Primary Care Tyan Dy: [name] Other Clinician: Referring Nereida Schepp: Treating Shrihaan Porzio/Extender: [name] in Treatment: 3 Vital Signs Height(in): 66 Pulse(bpm): 73 Weight(lbs): 109 Blood Pressure(mmHg): 155/69 Body Mass Index(BMI): 19.9 Temperature(F): 97.8 Respiratory Rate(breaths/min): 16 Photos: [N/A:N/A] Left, Anterior Lower Leg N/A N/A Wound  Location: Hematoma N/A N/A Wounding Event: Trauma, Other N/A N/A Primary Etiology: Cataracts, Middle ear problems, N/A N/A Comorbid History: Chronic Obstructive Pulmonary Disease (COPD), Angina, Hypertension, Raynauds, Received Chemotherapy 07/24/2021 N/A N/A Date Acquired: 3 N/A N/A Weeks of Treatment: Open N/A N/A Wound Status: No N/A N/A Wound Recurrence: 4.6x2.9x0.1 N/A N/A Measurements L x W x D (cm) 10.477 N/A  N/A A (cm) : rea 1.048 N/A N/A Volume (cm) : 68.80% N/A N/A % Reduction in Area: 84.40% N/A N/A % Reduction in Volume: Full Thickness Without Exposed N/A N/A Classification: Support Structures Medium N/A N/A Exudate Amount: Serosanguineous N/A N/A Exudate Type: red, brown N/A N/A Exudate Color: Large (67-100%) N/A N/A Granulation Amount: Red, Hyper-granulation N/A N/A Granulation Quality: Small (1-33%) N/A N/A Necrotic Amount: Fat Layer (Subcutaneous Tissue): Yes N/A N/A Exposed Structures: Fascia: No Tendon: No Muscle: No Joint: No Bone: No Small (1-33%) N/A N/A Epithelialization: Treatment Notes Electronic Signature(s) Signed: 09/17/2021 11:48:01 AM By: Fredirick Maudlin MD FACS Signed: 09/17/2021 5:15:35 PM By: Dellie Catholic RN Signed: 09/17/2021 5:15:35 PM By: Dellie Catholic RN Entered By: Fredirick Maudlin on 09/17/2021 11:48:01 -------------------------------------------------------------------------------- Multi-Disciplinary Care Plan Details Patient Name: Date of Service: [name], [place] J. 09/17/2021 11:00 A M Medical Record Number: [id number] Patient Account Number: [id number] Date of Birth/Sex: Treating RN: [birth year] (88 y.o. [name] Primary Care Stepheny Canal: [name] Other Clinician: Referring Tobey Schmelzle: Treating Minnie Legros/Extender: [name] in Treatment: 3 Active Inactive Wound/Skin Impairment Nursing Diagnoses: Impaired tissue integrity Goals: Patient/caregiver will verbalize  understanding of skin care regimen Date Initiated: 08/27/2021 Target Resolution Date: 10/10/2021 Goal Status: Active Interventions: Assess ulceration(s) every visit Treatment Activities: Skin care regimen initiated : 08/27/2021 Notes: Electronic Signature(s) Signed: 09/17/2021 5:15:35 PM By: Dellie Catholic RN Entered By: Dellie Catholic on 09/17/2021 17:11:26 -------------------------------------------------------------------------------- Pain Assessment Details Patient Name: Date of Service: [name], [place] J. 09/17/2021 11:00 A M Medical Record Number: [id number] Patient Account Number: [id number] Date of Birth/Sex: Treating RN: 10-28-1930 (88 y.o. [name] Primary Care Jakaria Lavergne: [name] Other Clinician: Referring Harriett Azar: Treating Kerolos Nehme/Extender: [name] in Treatment: 3 Active Problems Location of Pain Severity and Description of Pain Patient Has Paino No Site Locations Pain Management and Medication Current Pain Management: Electronic Signature(s) Signed: 09/17/2021 5:15:35 PM By: Dellie Catholic RN Entered By: Dellie Catholic on 09/17/2021 11:19:25 -------------------------------------------------------------------------------- Patient/Caregiver Education Details Patient Name: Date of Service: [place], [place] J. 6/7/2023andnbsp11:00 A M Medical Record Number: [id number] Patient Account Number: [id number] Date of Birth/Gender: Treating RN: 1931/03/07 (88 y.o. [name] Primary Care Physician: [name] Other Clinician: Referring Physician: Treating Physician/Extender: [name] in Treatment: 3 Education Assessment Education Provided To: Patient Education Topics Provided Wound/Skin Impairment: Methods: Explain/Verbal Responses: Return demonstration correctly Electronic Signature(s) Signed: 09/17/2021 5:15:35 PM By: Dellie Catholic RN Entered By: Dellie Catholic on 09/17/2021  17:11:40 -------------------------------------------------------------------------------- Wound Assessment Details Patient Name: Date of Service: [name], [place] J. 09/17/2021 11:00 A M Medical Record Number: [id number] Patient Account Number: [id number] Date of Birth/Sex: Treating RN: 08-18-1930 (88 y.o. [name] Primary Care Uriel Horkey: [name] Other Clinician: Referring Regis Wiland: Treating Paloma Grange/Extender: [name] in Treatment: 3 Wound Status Wound Number: 2 Primary Trauma, Other Etiology: Wound Location: Left, Anterior Lower Leg Wound Open Wounding Event: Hematoma Status: Date Acquired: 07/24/2021 Comorbid Cataracts, Middle ear problems, Chronic Obstructive Pulmonary Weeks Of Treatment: 3 History: Disease (COPD), Angina, Hypertension, Raynauds, Received Clustered Wound: No Chemotherapy Photos Wound Measurements Length: (cm) 4.6 Width: (cm) 2.9 Depth: (cm) 0.1 Area: (cm) 10.477 Volume: (cm) 1.048 % Reduction in Area: 68.8% % Reduction in Volume: 84.4% Epithelialization: Small (1-33%) Tunneling: No Undermining: No Wound Description Classification: Full Thickness Without Exposed Support Structures Exudate Amount: Medium Exudate Type: Serosanguineous Exudate Color: red, brown Foul Odor After Cleansing: No Slough/Fibrino Yes Wound Bed Granulation Amount: Large (  67-100%) Exposed Structure Granulation Quality: Red, Hyper-granulation Fascia Exposed: No Necrotic Amount: Small (1-33%) Fat Layer (Subcutaneous Tissue) Exposed: Yes Necrotic Quality: Adherent Slough Tendon Exposed: No Muscle Exposed: No Joint Exposed: No Bone Exposed: No Treatment Notes Wound #2 (Lower Leg) Wound Laterality: Left, Anterior Cleanser Soap and Water Discharge Instruction: May shower and wash wound with dial antibacterial soap and water prior to dressing change. Wound Cleanser Discharge Instruction: Cleanse the wound with wound cleanser prior to  applying a clean dressing using gauze sponges, not tissue or cotton balls. Peri-Wound Care Topical Primary Dressing Hydrofera Blue Classic Foam, 4x4 in Discharge Instruction: Moisten with saline prior to applying to wound bed Secondary Dressing ABD Pad, 8x10 Discharge Instruction: Apply over primary dressing as directed. Woven Gauze Sponge, Non-Sterile 4x4 in Discharge Instruction: Apply over primary dressing as directed. Zetuvit Plus 4x8 in Discharge Instruction: Apply over primary dressing as directed. Secured With [employer] 4x5 (in/yd) Discharge Instruction: Secure with Coban as directed. Kerlix Roll Sterile, 4.5x3.1 (in/yd) Discharge Instruction: Secure with Kerlix as directed. 87M Medipore Soft Cloth Surgical T 2x10 (in/yd) ape Discharge Instruction: Secure with tape as directed. Compression Wrap Compression Stockings Add-Ons Electronic Signature(s) Signed: 09/17/2021 5:15:35 PM By: Dellie Catholic RN Entered By: Dellie Catholic on 09/17/2021 11:45:45 -------------------------------------------------------------------------------- Vitals Details Patient Name: Date of Service: [name], [place] J. 09/17/2021 11:00 A M Medical Record Number: [id number] Patient Account Number: [id number] Date of Birth/Sex: Treating RN: Sep 08, 1930 (88 y.o. [name] Primary Care Nandita Mathenia: [name] Other Clinician: Referring Joellyn Grandt: Treating Mehlani Blankenburg/Extender: [name] in Treatment: 3 Vital Signs Time Taken: 11:18 Temperature (F): 97.8 Height (in): 62 Pulse (bpm): 65 Weight (lbs): 109 Respiratory Rate (breaths/min): 16 Body Mass Index (BMI): 19.9 Blood Pressure (mmHg): 155/69 Reference Range: 80 - 120 mg / dl Electronic Signature(s) Signed: 09/17/2021 5:15:35 PM By: Dellie Catholic RN Entered By: Dellie Catholic on 09/17/2021 11:19:18

## 2021-09-18 NOTE — Progress Notes (Signed)
Brandy Peterson, Brandy Peterson ([id number]) Visit Report for 09/17/2021 Chief Complaint Document Details Patient Name: Date of Service: HA Mirna Mires, [place] [name] 09/17/2021 11:00 A M Medical Record Number: [id number] Patient Account Number: [id number] Date of Birth/Sex: Treating RN: 1930/07/22 (88 y.o. [name] Primary Care Provider: [name] Other Clinician: Referring Provider: Treating Provider/Extender: [name] in Treatment: 3 Information Obtained from: Patient Chief Complaint Patient is here for a wound on her left lower extremity which is been present for one month Electronic Signature(s) Signed: 09/17/2021 11:48:06 AM By: Fredirick Maudlin MD FACS Entered By: Fredirick Maudlin on 09/17/2021 11:48:06 -------------------------------------------------------------------------------- HPI Details Patient Name: Date of Service: [name], [place] J. 09/17/2021 11:00 A M Medical Record Number: [id number] Patient Account Number: [id number] Date of Birth/Sex: Treating RN: 25-Jan-1931 (88 y.o. [name] Primary Care Provider: [name] Other Clinician: Referring Provider: Treating Provider/Extender: [name] in Treatment: 3 History of Present Illness HPI Description: 10/17/15; this is a patient who has a history of chronic Venous insufficiency. Previously known to vascular surgery. She has had an ablation her left lesser saphenous vein in late 2016 and she had stab phlebectomy of residual varicosities in the left leg as well. She tells me in terms of wounds that she traumatized her anterior legs bilaterally on the bag collector of her 1 more last year which took a long period of time to heal. Her current wound happen when she dropped her billfold on the anterior leg roughly a month ago. Since then she has been using Neosporin Vaseline, nonstick pads and Kerlix wraps. Her ABIs are 1.3 on the left 1.27 on the right she does not appear to have significant  arterial insufficiency. Patient is not a diabetic. She does have a history of Raynaud's phenomenon which is an isolated event not associated with any rheumatologic disease. 10/24/15; patient tolerated last week's dressing well. No new complaints. Using Prisma under Kerlix Coban 10/31/15; patient continued to tolerate her dressing well which was Prisma Kerlix Coban wound has healed. This is her second go around with wounds on her legs. She apparently had wounds on by her bilateral anterior lower legs in December 16 after hitting them on a lawnmower bag. She tells me she has below-knee pressure stockings at home but doesn't wear them during the summer. I am doubtful I am going to be able to talk her into that now. She has known severe venous insufficiency and has had ablations in the past READMISSION 08/27/2021 This is an 88 year old woman with a past medical history significant for venous insufficiency, COPD, and Raynaud's phenomenon. She suffered a deep contusion to her left anterior tibial surface that resulted in a hematoma after an object fell from about 6 feet high and struck her in the leg. She presented to the emergency department on July 24, 2021. They did not evacuate the hematoma. They gave her bacitracin to apply. She has been doing so since that time. She is not a diabetic and does not smoke. The wound has developed a heavy layer of eschar over the surface. She is here today for further evaluation and management. She says that generally, it is not painful unless she is standing for prolonged periods of time. ABI in clinic today was noncompressible. 09/03/2021: The wound looks very good today. The surface is clean with just a little bit of slough accumulation. There is a nice bed of granulation tissue on the surface. She has a bud of epithelialized tissue in  the midportion of the wound. 09/10/2021: The wound continues to contract. The [place] of epithelialized tissue in the midportion of the  wound has expanded and there is a new epithelialized area arising just adjacent to it. The wound surface is clean without any slough accumulation. Good granulation tissue present. 09/17/2021: The wound is smaller again today. The 2 islands of epithelialized tissue have merged. The surface is clean without any slough accumulation and the granulation tissue is robust and healthy. No concern for infection. Electronic Signature(s) Signed: 09/17/2021 11:48:45 AM By: Fredirick Maudlin MD FACS Entered By: Fredirick Maudlin on 09/17/2021 11:48:45 -------------------------------------------------------------------------------- Physical Exam Details Patient Name: Date of Service: [name], [place] J. 09/17/2021 11:00 A M Medical Record Number: [id number] Patient Account Number: [id number] Date of Birth/Sex: Treating RN: June 24, 1930 (88 y.o. [name] Primary Care Provider: [name] Other Clinician: Referring Provider: Treating Provider/Extender: [name] in Treatment: 3 Constitutional Hypertensive, asymptomatic. . . . No acute distress.Marland Kitchen Respiratory Normal work of breathing on room air.. Notes 09/17/2021: The wound is smaller again today. The 2 islands of epithelialized tissue have merged. The surface is clean without any slough accumulation and the granulation tissue is robust and healthy. No concern for infection. Electronic Signature(s) Signed: 09/17/2021 11:50:49 AM By: Fredirick Maudlin MD FACS Entered By: Fredirick Maudlin on 09/17/2021 11:50:48 -------------------------------------------------------------------------------- Physician Orders Details Patient Name: Date of Service: [name], [place] J. 09/17/2021 11:00 A M Medical Record Number: [id number] Patient Account Number: [id number] Date of Birth/Sex: Treating RN: [birth year] (88 y.o. [name] Primary Care Provider: [name] Other Clinician: Referring Provider: Treating Provider/Extender: [name] in Treatment: 3 Verbal / Phone Orders: No Diagnosis Coding ICD-10 Coding Code Description [phone] Non-pressure chronic ulcer of other part of left lower leg with fat layer exposed S80.12XS Contusion of left lower leg, sequela J44.9 Chronic obstructive pulmonary disease, unspecified I87.2 Venous insufficiency (chronic) (peripheral) Follow-up Appointments ppointment in 1 week. - Dr Celine Ahr Room 3 Wednesday June 14th at 11am Return A Bathing/ Shower/ Hygiene Other Bathing/Shower/Hygiene Orders/Instructions: - Please take sponge baths.Please do not get Left leg dressing wet. Edema Control - Lymphedema / SCD / Other Bilateral Lower Extremities Avoid standing for long periods of time. Moisturize legs daily. Wound Treatment Wound #2 - Lower Leg Wound Laterality: Left, Anterior Cleanser: Soap and Water 1 x Per Week/30 Days Discharge Instructions: May shower and wash wound with dial antibacterial soap and water prior to dressing change. Cleanser: Wound Cleanser 1 x Per Week/30 Days Discharge Instructions: Cleanse the wound with wound cleanser prior to applying a clean dressing using gauze sponges, not tissue or cotton balls. Prim Dressing: Hydrofera Blue Classic Foam, 4x4 in 1 x Per Week/30 Days ary Discharge Instructions: Moisten with saline prior to applying to wound bed Secondary Dressing: ABD Pad, 8x10 1 x Per Week/30 Days Discharge Instructions: Apply over primary dressing as directed. Secondary Dressing: Woven Gauze Sponge, Non-Sterile 4x4 in 1 x Per Week/30 Days Discharge Instructions: Apply over primary dressing as directed. Secondary Dressing: Zetuvit Plus 4x8 in 1 x Per Week/30 Days Discharge Instructions: Apply over primary dressing as directed. Secured With: Coban Self-Adherent Wrap 4x5 (in/yd) 1 x Per Week/30 Days Discharge Instructions: Secure with Coban as directed. Secured With: [employer], 4.5x3.1 (in/yd) 1 x Per Week/30 Days Discharge  Instructions: Secure with Kerlix as directed. Secured With: 59M Medipore Public affairs consultant Surgical T 2x10 (in/yd) 1 x Per Week/30 Days ape Discharge Instructions: Secure with tape as directed.  Electronic Signature(s) Signed: 09/17/2021 11:51:07 AM By: Fredirick Maudlin MD FACS Entered By: Fredirick Maudlin on 09/17/2021 11:51:06 -------------------------------------------------------------------------------- Problem List Details Patient Name: Date of Service: [name], [place] J. 09/17/2021 11:00 A M Medical Record Number: [id number] Patient Account Number: [id number] Date of Birth/Sex: Treating RN: [birth year] (88 y.o. [name] Primary Care Provider: [name] Other Clinician: Referring Provider: Treating Provider/Extender: [name] in Treatment: 3 Active Problems ICD-10 Encounter Code Description Active Date MDM Diagnosis [phone] Non-pressure chronic ulcer of other part of left lower leg with fat layer exposed5/17/2023 No Yes S80.12XS Contusion of left lower leg, sequela 08/27/2021 No Yes J44.9 Chronic obstructive pulmonary disease, unspecified 08/27/2021 No Yes I87.2 Venous insufficiency (chronic) (peripheral) 08/27/2021 No Yes Inactive Problems Resolved Problems Electronic Signature(s) Signed: 09/17/2021 11:47:52 AM By: Fredirick Maudlin MD FACS Entered By: Fredirick Maudlin on 09/17/2021 11:47:51 -------------------------------------------------------------------------------- Progress Note/History and Physical Details Patient Name: Date of Service: [name], [place] J. 09/17/2021 11:00 A M Medical Record Number: [id number] Patient Account Number: [id number] Date of Birth/Sex: Treating RN: 02/07/1931 (88 y.o. [name] Primary Care Provider: [name] Other Clinician: Referring Provider: Treating Provider/Extender: [name] in Treatment: 3 Subjective Chief Complaint Information obtained from Patient Patient is here for a  wound on her left lower extremity which is been present for one month History of Present Illness (HPI) 10/17/15; this is a patient who has a history of chronic Venous insufficiency. Previously known to vascular surgery. She has had an ablation her left lesser saphenous vein in late 2016 and she had stab phlebectomy of residual varicosities in the left leg as well. She tells me in terms of wounds that she traumatized her anterior legs bilaterally on the bag collector of her 1 more last year which took a long period of time to heal. Her current wound happen when she dropped her billfold on the anterior leg roughly a month ago. Since then she has been using Neosporin Vaseline, nonstick pads and Kerlix wraps. Her ABIs are 1.3 on the left 1.27 on the right she does not appear to have significant arterial insufficiency. Patient is not a diabetic. She does have a history of Raynaud's phenomenon which is an isolated event not associated with any rheumatologic disease. 10/24/15; patient tolerated last week's dressing well. No new complaints. Using Prisma under Kerlix Coban 10/31/15; patient continued to tolerate her dressing well which was Prisma Kerlix Coban wound has healed. This is her second go around with wounds on her legs. She apparently had wounds on by her bilateral anterior lower legs in December 16 after hitting them on a lawnmower bag. She tells me she has below-knee pressure stockings at home but doesn't wear them during the summer. I am doubtful I am going to be able to talk her into that now. She has known severe venous insufficiency and has had ablations in the past READMISSION 08/27/2021 This is an 88 year old woman with a past medical history significant for venous insufficiency, COPD, and Raynaud's phenomenon. She suffered a deep contusion to her left anterior tibial surface that resulted in a hematoma after an object fell from about 6 feet high and struck her in the leg. She presented to the  emergency department on July 24, 2021. They did not evacuate the hematoma. They gave her bacitracin to apply. She has been doing so since that time. She is not a diabetic and does not smoke. The wound has developed a heavy layer of eschar over  the surface. She is here today for further evaluation and management. She says that generally, it is not painful unless she is standing for prolonged periods of time. ABI in clinic today was noncompressible. 09/03/2021: The wound looks very good today. The surface is clean with just a little bit of slough accumulation. There is a nice bed of granulation tissue on the surface. She has a bud of epithelialized tissue in the midportion of the wound. 09/10/2021: The wound continues to contract. The [place] of epithelialized tissue in the midportion of the wound has expanded and there is a new epithelialized area arising just adjacent to it. The wound surface is clean without any slough accumulation. Good granulation tissue present. 09/17/2021: The wound is smaller again today. The 2 islands of epithelialized tissue have merged. The surface is clean without any slough accumulation and the granulation tissue is robust and healthy. No concern for infection. Patient History Information obtained from Patient, Chart. Family History Cancer - Mother, Heart Disease - Mother,Father,Siblings, Hypertension - Mother,Father,Siblings, Thyroid Problems - Mother, No family history of Diabetes, Hereditary Spherocytosis, Kidney Disease, Lung Disease, Stroke, Tuberculosis. Social History Former smoker - quit in 1990, Marital Status - Widowed, Alcohol Use - Moderate - 3 glasses of wine weekly, Drug Use - No History, Caffeine Use - Daily - coffee. Medical History Eyes Patient has history of Cataracts Denies history of Glaucoma, Optic Neuritis Ear/Nose/Mouth/Throat Patient has history of Middle ear problems Denies history of Chronic sinus  problems/congestion Hematologic/Lymphatic Denies history of Anemia, Hemophilia, Human Immunodeficiency Virus, Lymphedema, Sickle Cell Disease Respiratory Patient has history of Chronic Obstructive Pulmonary Disease (COPD) Denies history of Aspiration, Asthma, Pneumothorax, Sleep Apnea, Tuberculosis Cardiovascular Patient has history of Angina, Hypertension Denies history of Arrhythmia, Congestive Heart Failure, Coronary Artery Disease, Deep Vein Thrombosis, Hypotension, Myocardial Infarction, Peripheral Arterial Disease, Peripheral Venous Disease, Phlebitis, Vasculitis Gastrointestinal Denies history of Cirrhosis , Colitis, Crohnoos, Hepatitis A, Hepatitis B, Hepatitis C Endocrine Denies history of Type I Diabetes, Type II Diabetes Genitourinary Denies history of End Stage Renal Disease Immunological Patient has history of Raynaudoos Denies history of Lupus Erythematosus, Scleroderma Integumentary (Skin) Denies history of History of Burn Musculoskeletal Denies history of Gout, Rheumatoid Arthritis, Osteoarthritis, Osteomyelitis Neurologic Denies history of Dementia, Neuropathy, Quadriplegia, Paraplegia, Seizure Disorder Oncologic Patient has history of Received Chemotherapy - due to breast ca Denies history of Received Radiation Psychiatric Denies history of Anorexia/bulimia, Confinement Anxiety Hospitalization/Surgery History - Bone spurs. - hysterectomy. - mastectomy. - back surgery. - back injury. Medical A Surgical History Notes nd Constitutional Symptoms (General Health) recent laser ablation of (L) small saphenous vein due to varicosities , thyroid disease , plantars fasciitis , h/o shingles Hematologic/Lymphatic hemachromatosis Respiratory h/o pneumonia Gastrointestinal irritable bowel syndrome , GERD , spastic colon Oncologic (R) breast cancer Objective Constitutional Hypertensive, asymptomatic. No acute distress.. Vitals Time Taken: 11:18 AM, Height: 62 in,  Weight: 109 lbs, BMI: 19.9, Temperature: 97.8 F, Pulse: 65 bpm, Respiratory Rate: 16 breaths/min, Blood Pressure: 155/69 mmHg. Respiratory Normal work of breathing on room air.. General Notes: 09/17/2021: The wound is smaller again today. The 2 islands of epithelialized tissue have merged. The surface is clean without any slough accumulation and the granulation tissue is robust and healthy. No concern for infection. Integumentary (Hair, Skin) Wound #2 status is Open. Original cause of wound was Hematoma. The date acquired was: 07/24/2021. The wound has been in treatment 3 weeks. The wound is located on the Left,Anterior Lower Leg. The wound measures 4.6cm length x 2.9cm width x  0.1cm depth; 10.477cm^2 area and 1.048cm^3 volume. There is Fat Layer (Subcutaneous Tissue) exposed. There is no tunneling or undermining noted. There is a medium amount of serosanguineous drainage noted. There is large (67-100%) red, hyper - granulation within the wound bed. There is a small (1-33%) amount of necrotic tissue within the wound bed including Adherent Slough. Assessment Active Problems ICD-10 Non-pressure chronic ulcer of other part of left lower leg with fat layer exposed Contusion of left lower leg, sequela Chronic obstructive pulmonary disease, unspecified Venous insufficiency (chronic) (peripheral) Plan Follow-up Appointments: Return Appointment in 1 week. - Dr Celine Ahr Room 3 Wednesday June 14th at [facility]: Other Bathing/Shower/Hygiene Orders/Instructions: - Please take sponge baths.Please do not get Left leg dressing wet. Edema Control - Lymphedema / SCD / Other: Avoid standing for long periods of time. Moisturize legs daily. WOUND #2: - Lower Leg Wound Laterality: Left, Anterior Cleanser: Soap and Water 1 x Per Week/30 Days Discharge Instructions: May shower and wash wound with dial antibacterial soap and water prior to dressing change. Cleanser: Wound Cleanser 1 x Per  Week/30 Days Discharge Instructions: Cleanse the wound with wound cleanser prior to applying a clean dressing using gauze sponges, not tissue or cotton balls. Prim Dressing: Hydrofera Blue Classic Foam, 4x4 in 1 x Per Week/30 Days ary Discharge Instructions: Moisten with saline prior to applying to wound bed Secondary Dressing: ABD Pad, 8x10 1 x Per Week/30 Days Discharge Instructions: Apply over primary dressing as directed. Secondary Dressing: Woven Gauze Sponge, Non-Sterile 4x4 in 1 x Per Week/30 Days Discharge Instructions: Apply over primary dressing as directed. Secondary Dressing: Zetuvit Plus 4x8 in 1 x Per Week/30 Days Discharge Instructions: Apply over primary dressing as directed. Secured With: Coban Self-Adherent Wrap 4x5 (in/yd) 1 x Per Week/30 Days Discharge Instructions: Secure with Coban as directed. Secured With: [employer], 4.5x3.1 (in/yd) 1 x Per Week/30 Days Discharge Instructions: Secure with Kerlix as directed. Secured With: 44M Medipore Public affairs consultant Surgical T 2x10 (in/yd) 1 x Per Week/30 Days ape Discharge Instructions: Secure with tape as directed. 09/17/2021: The wound is smaller again today. The 2 islands of epithelialized tissue have merged. The surface is clean without any slough accumulation and the granulation tissue is robust and healthy. No concern for infection. No debridement was necessary today. We will continue Hydrofera Blue with Kerlix and Coban wrapping. Follow-up in 1 week. Electronic Signature(s) Signed: 09/17/2021 11:51:32 AM By: Fredirick Maudlin MD FACS Entered By: Fredirick Maudlin on 09/17/2021 11:51:32 -------------------------------------------------------------------------------- HxROS Details Patient Name: Date of Service: [name], [place] J. 09/17/2021 11:00 A M Medical Record Number: [id number] Patient Account Number: [id number] Date of Birth/Sex: Treating RN: Aug 07, 1930 (88 y.o. [name] Primary Care Provider: [name] Other Clinician: Referring Provider: Treating Provider/Extender: [name] in Treatment: 3 Label Progress Note Print Version as History and Physical for this encounter Information Obtained From Patient Chart Constitutional Symptoms (General Health) Medical History: Past Medical History Notes: recent laser ablation of (L) small saphenous vein due to varicosities , thyroid disease , plantars fasciitis , h/o shingles Eyes Medical History: Positive for: Cataracts Negative for: Glaucoma; Optic Neuritis Ear/Nose/Mouth/Throat Medical History: Positive for: Middle ear problems Negative for: Chronic sinus problems/congestion Hematologic/Lymphatic Medical History: Negative for: Anemia; Hemophilia; Human Immunodeficiency Virus; Lymphedema; Sickle Cell Disease Past Medical History Notes: hemachromatosis Respiratory Medical History: Positive for: Chronic Obstructive Pulmonary Disease (COPD) Negative for: Aspiration; Asthma; Pneumothorax; Sleep Apnea; Tuberculosis Past Medical History Notes: h/o pneumonia Cardiovascular Medical History:  Positive for: Angina; Hypertension Negative for: Arrhythmia; Congestive Heart Failure; Coronary Artery Disease; Deep Vein Thrombosis; Hypotension; Myocardial Infarction; Peripheral Arterial Disease; Peripheral Venous Disease; Phlebitis; Vasculitis Gastrointestinal Medical History: Negative for: Cirrhosis ; Colitis; Crohns; Hepatitis A; Hepatitis B; Hepatitis C Past Medical History Notes: irritable bowel syndrome , GERD , spastic colon Endocrine Medical History: Negative for: Type I Diabetes; Type II Diabetes Genitourinary Medical History: Negative for: End Stage Renal Disease Immunological Medical History: Positive for: Raynauds Negative for: Lupus Erythematosus; Scleroderma Integumentary (Skin) Medical History: Negative for: History of Burn Musculoskeletal Medical History: Negative for: Gout; Rheumatoid  Arthritis; Osteoarthritis; Osteomyelitis Neurologic Medical History: Negative for: Dementia; Neuropathy; Quadriplegia; Paraplegia; Seizure Disorder Oncologic Medical History: Positive for: Received Chemotherapy - due to breast ca Negative for: Received Radiation Past Medical History Notes: (R) breast cancer Psychiatric Medical History: Negative for: Anorexia/bulimia; Confinement Anxiety HBO Extended History Items Eyes: Ear/Nose/Mouth/Throat: Cataracts Middle ear problems Immunizations Pneumococcal Vaccine: Received Pneumococcal Vaccination: Yes Received Pneumococcal Vaccination On or After 60th Birthday: Yes Tetanus Vaccine: Last tetanus shot: 04/05/2015 Implantable Devices No devices added Hospitalization / Surgery History Type of Hospitalization/Surgery Bone spurs hysterectomy mastectomy back surgery back injury Family and Social History Cancer: Yes - Mother; Diabetes: No; Heart Disease: Yes - Mother,Father,Siblings; Hereditary Spherocytosis: No; Hypertension: Yes - Mother,Father,Siblings; Kidney Disease: No; Lung Disease: No; Stroke: No; Thyroid Problems: Yes - Mother; Tuberculosis: No; Former smoker - quit in 1990; Marital Status - Widowed; Alcohol Use: Moderate - 3 glasses of wine weekly; Drug Use: No History; Caffeine Use: Daily - coffee; Financial Concerns: No; Food, Clothing or Shelter Needs: No; Support System Lacking: No; Transportation Concerns: No Electronic Signature(s) Signed: 09/17/2021 2:37:03 PM By: Fredirick Maudlin MD FACS Signed: 09/17/2021 5:15:35 PM By: Dellie Catholic RN Entered By: Fredirick Maudlin on 09/17/2021 11:49:07 -------------------------------------------------------------------------------- Lindsey Details Patient Name: Date of Service: [name], [place] J. 09/17/2021 Medical Record Number: [id number] Patient Account Number: [id number] Date of Birth/Sex: Treating RN: [birth year] (88 y.o. [name] Primary Care Provider: [name] Other Clinician: Referring Provider: Treating Provider/Extender: [name] in Treatment: 3 Diagnosis Coding ICD-10 Codes Code Description [phone] Non-pressure chronic ulcer of other part of left lower leg with fat layer exposed S80.12XS Contusion of left lower leg, sequela J44.9 Chronic obstructive pulmonary disease, unspecified I87.2 Venous insufficiency (chronic) (peripheral) Facility Procedures CPT4 Code: [id number] Description: 96222 - WOUND CARE VISIT-LEV 3 EST PT Modifier: Quantity: 1 Physician Procedures Electronic Signature(s) Signed: 09/17/2021 5:15:35 PM By: Dellie Catholic RN Signed: 09/18/2021 7:29:43 AM By: Fredirick Maudlin MD FACS Previous Signature: 09/17/2021 11:51:56 AM Version By: Fredirick Maudlin MD FACS Entered By: Dellie Catholic on 09/17/2021 17:14:30

## 2021-09-22 ENCOUNTER — Other Ambulatory Visit: Payer: Self-pay | Admitting: Internal Medicine

## 2021-09-22 NOTE — Telephone Encounter (Signed)
Please refill as per office routine med refill policy (all routine meds to be refilled for 3 mo or monthly (per pt preference) up to one year from last visit, then month to month grace period for 3 mo, then further med refills will have to be denied) ? ?

## 2021-09-24 ENCOUNTER — Encounter (HOSPITAL_BASED_OUTPATIENT_CLINIC_OR_DEPARTMENT_OTHER): Payer: Medicare Other | Admitting: General Surgery

## 2021-09-24 DIAGNOSIS — L97822 Non-pressure chronic ulcer of other part of left lower leg with fat layer exposed: Secondary | ICD-10-CM | POA: Diagnosis not present

## 2021-09-24 DIAGNOSIS — S8012XS Contusion of left lower leg, sequela: Secondary | ICD-10-CM | POA: Diagnosis not present

## 2021-09-24 DIAGNOSIS — I872 Venous insufficiency (chronic) (peripheral): Secondary | ICD-10-CM | POA: Diagnosis not present

## 2021-09-24 DIAGNOSIS — Z87891 Personal history of nicotine dependence: Secondary | ICD-10-CM | POA: Diagnosis not present

## 2021-09-24 DIAGNOSIS — J449 Chronic obstructive pulmonary disease, unspecified: Secondary | ICD-10-CM | POA: Diagnosis not present

## 2021-09-24 DIAGNOSIS — Z853 Personal history of malignant neoplasm of breast: Secondary | ICD-10-CM | POA: Diagnosis not present

## 2021-09-24 NOTE — Progress Notes (Addendum)
PAULYNE, MOOTY ([id number]) Visit Report for 09/24/2021 Chief Complaint Document Details Patient Name: Date of Service: HA Mirna Mires, [place] [name] 09/24/2021 11:00 A M Medical Record Number: [id number] Patient Account Number: [id number] Date of Birth/Sex: Treating RN: April 12, 1931 (88 y.o. [name] Primary Care Provider: [name] Other Clinician: Referring Provider: Treating Provider/Extender: [name] in Treatment: 4 Information Obtained from: Patient Chief Complaint Patient is here for a wound on her left lower extremity which is been present for one month Electronic Signature(s) Signed: 09/24/2021 11:55:32 AM By: Fredirick Maudlin MD FACS Entered By: Fredirick Maudlin on 09/24/2021 11:55:31 -------------------------------------------------------------------------------- HPI Details Patient Name: Date of Service: [name], [place] J. 09/24/2021 11:00 A M Medical Record Number: [id number] Patient Account Number: [id number] Date of Birth/Sex: Treating RN: 1931-02-05 (88 y.o. [name] Primary Care Provider: [name] Other Clinician: Referring Provider: Treating Provider/Extender: [name] in Treatment: 4 History of Present Illness HPI Description: 10/17/15; this is a patient who has a history of chronic Venous insufficiency. Previously known to vascular surgery. She has had an ablation her left lesser saphenous vein in late 2016 and she had stab phlebectomy of residual varicosities in the left leg as well. She tells me in terms of wounds that she traumatized her anterior legs bilaterally on the bag collector of her 1 more last year which took a long period of time to heal. Her current wound happen when she dropped her billfold on the anterior leg roughly a month ago. Since then she has been using Neosporin Vaseline, nonstick pads and Kerlix wraps. Her ABIs are 1.3 on the left 1.27 on the right she does not appear to have significant  arterial insufficiency. Patient is not a diabetic. She does have a history of Raynaud's phenomenon which is an isolated event not associated with any rheumatologic disease. 10/24/15; patient tolerated last week's dressing well. No new complaints. Using Prisma under Kerlix Coban 10/31/15; patient continued to tolerate her dressing well which was Prisma Kerlix Coban wound has healed. This is her second go around with wounds on her legs. She apparently had wounds on by her bilateral anterior lower legs in December 16 after hitting them on a lawnmower bag. She tells me she has below-knee pressure stockings at home but doesn't wear them during the summer. I am doubtful I am going to be able to talk her into that now. She has known severe venous insufficiency and has had ablations in the past READMISSION 08/27/2021 This is an 88 year old woman with a past medical history significant for venous insufficiency, COPD, and Raynaud's phenomenon. She suffered a deep contusion to her left anterior tibial surface that resulted in a hematoma after an object fell from about 6 feet high and struck her in the leg. She presented to the emergency department on July 24, 2021. They did not evacuate the hematoma. They gave her bacitracin to apply. She has been doing so since that time. She is not a diabetic and does not smoke. The wound has developed a heavy layer of eschar over the surface. She is here today for further evaluation and management. She says that generally, it is not painful unless she is standing for prolonged periods of time. ABI in clinic today was noncompressible. 09/03/2021: The wound looks very good today. The surface is clean with just a little bit of slough accumulation. There is a nice bed of granulation tissue on the surface. She has a bud of epithelialized tissue in  the midportion of the wound. 09/10/2021: The wound continues to contract. The [place] of epithelialized tissue in the midportion of the  wound has expanded and there is a new epithelialized area arising just adjacent to it. The wound surface is clean without any slough accumulation. Good granulation tissue present. 09/17/2021: The wound is smaller again today. The 2 islands of epithelialized tissue have merged. The surface is clean without any slough accumulation and the granulation tissue is robust and healthy. No concern for infection. 09/24/2021: The wound continues to contract and epithelialize. She says that she does not like the [facility] because it seems to pull tissue off when the dressing is changed. She is also wondering if we could change her dressing to one that she could do at home. The wound itself is quite a bit smaller with excellent epithelialization. The open areas are flat and flush with the surrounding skin and have a nice layer of granulation tissue present. Electronic Signature(s) Signed: 09/24/2021 11:57:05 AM By: Fredirick Maudlin MD FACS Entered By: Fredirick Maudlin on 09/24/2021 11:57:04 -------------------------------------------------------------------------------- Physical Exam Details Patient Name: Date of Service: [name], [place] J. 09/24/2021 11:00 A M Medical Record Number: [id number] Patient Account Number: [id number] Date of Birth/Sex: Treating RN: 1931/02/09 (88 y.o. [name] Primary Care Provider: [name] Other Clinician: Referring Provider: Treating Provider/Extender: [name] in Treatment: 4 Constitutional Hypertensive, asymptomatic. . . . No acute distress.Marland Kitchen Respiratory Normal work of breathing on room air.. Notes 09/24/2021: The wound is quite a bit smaller with excellent epithelialization. The open areas are flat and flush with the surrounding skin and have a nice layer of granulation tissue present. Electronic Signature(s) Signed: 09/24/2021 11:57:51 AM By: Fredirick Maudlin MD FACS Entered By: Fredirick Maudlin on 09/24/2021  11:57:51 -------------------------------------------------------------------------------- Physician Orders Details Patient Name: Date of Service: [name], [place] J. 09/24/2021 11:00 A M Medical Record Number: [id number] Patient Account Number: [id number] Date of Birth/Sex: Treating RN: 23-Mar-1931 (88 y.o. [name] Primary Care Provider: [name] Other Clinician: Referring Provider: Treating Provider/Extender: [name] in Treatment: 4 Verbal / Phone Orders: No Diagnosis Coding ICD-10 Coding Code Description [phone] Non-pressure chronic ulcer of other part of left lower leg with fat layer exposed S80.12XS Contusion of left lower leg, sequela J44.9 Chronic obstructive pulmonary disease, unspecified I87.2 Venous insufficiency (chronic) (peripheral) Follow-up Appointments ppointment in 1 week. - Dr Celine Ahr Room 3 June 21st at 2:30pm Return A Licensed conveyancer Other Bathing/Shower/Hygiene Orders/Instructions: - Please take sponge baths.Please do not get Left leg dressing wet. Edema Control - Lymphedema / SCD / Other Bilateral Lower Extremities Avoid standing for long periods of time. Moisturize legs daily. Wound Treatment Wound #2 - Lower Leg Wound Laterality: Left, Anterior Cleanser: Soap and Water 1 x Per Week/15 Days Discharge Instructions: May shower and wash wound with dial antibacterial soap and water prior to dressing change. Cleanser: Wound Cleanser (Generic) 1 x Per Week/15 Days Discharge Instructions: Cleanse the wound with wound cleanser prior to applying a clean dressing using gauze sponges, not tissue or cotton balls. Prim Dressing: Promogran Prisma Matrix, 4.34 (sq in) (silver collagen) (Generic) 1 x Per Week/15 Days ary Discharge Instructions: Moisten collagen with saline or hydrogel Secondary Dressing: ABD Pad, 8x10 (Generic) 1 x Per Week/15 Days Discharge Instructions: Apply over primary dressing as directed. Secondary  Dressing: Woven Gauze Sponge, Non-Sterile 4x4 in (Generic) 1 x Per Week/15 Days Discharge Instructions: Apply over primary dressing as directed. Secured With: Patent attorney  Bandage 4 inch (ACE bandage) (Generic) 1 x Per Week/15 Days Discharge Instructions: Secure with ACE bandage as directed. Secured With: [employer], 4.5x3.1 (in/yd) (Generic) 1 x Per Week/15 Days Discharge Instructions: Secure with Kerlix as directed. Secured With: 10M Medipore Public affairs consultant Surgical T 2x10 (in/yd) (Generic) 1 x Per Week/15 Days ape Discharge Instructions: Secure with tape as directed. Electronic Signature(s) Signed: 09/24/2021 12:19:31 PM By: Dellie Catholic RN Signed: 09/24/2021 12:42:49 PM By: Fredirick Maudlin MD FACS Previous Signature: 09/24/2021 11:58:07 AM Version By: Fredirick Maudlin MD FACS Entered By: Dellie Catholic on 09/24/2021 12:10:09 -------------------------------------------------------------------------------- Problem List Details Patient Name: Date of Service: [name], [place] J. 09/24/2021 11:00 A M Medical Record Number: [id number] Patient Account Number: [id number] Date of Birth/Sex: Treating RN: [birth year] (88 y.o. [name] Primary Care Provider: [name] Other Clinician: Referring Provider: Treating Provider/Extender: [name] in Treatment: 4 Active Problems ICD-10 Encounter Code Description Active Date MDM Diagnosis [phone] Non-pressure chronic ulcer of other part of left lower leg with fat layer exposed5/17/2023 No Yes S80.12XS Contusion of left lower leg, sequela 08/27/2021 No Yes J44.9 Chronic obstructive pulmonary disease, unspecified 08/27/2021 No Yes I87.2 Venous insufficiency (chronic) (peripheral) 08/27/2021 No Yes Inactive Problems Resolved Problems Electronic Signature(s) Signed: 09/24/2021 11:54:26 AM By: Fredirick Maudlin MD FACS Entered By: Fredirick Maudlin on 09/24/2021  11:54:26 -------------------------------------------------------------------------------- Progress Note/History and Physical Details Patient Name: Date of Service: [name], [place] J. 09/24/2021 11:00 A M Medical Record Number: [id number] Patient Account Number: [id number] Date of Birth/Sex: Treating RN: 06/22/1930 (88 y.o. [name] Primary Care Provider: [name] Other Clinician: Referring Provider: Treating Provider/Extender: [name] in Treatment: 4 Subjective Chief Complaint Information obtained from Patient Patient is here for a wound on her left lower extremity which is been present for one month History of Present Illness (HPI) 10/17/15; this is a patient who has a history of chronic Venous insufficiency. Previously known to vascular surgery. She has had an ablation her left lesser saphenous vein in late 2016 and she had stab phlebectomy of residual varicosities in the left leg as well. She tells me in terms of wounds that she traumatized her anterior legs bilaterally on the bag collector of her 1 more last year which took a long period of time to heal. Her current wound happen when she dropped her billfold on the anterior leg roughly a month ago. Since then she has been using Neosporin Vaseline, nonstick pads and Kerlix wraps. Her ABIs are 1.3 on the left 1.27 on the right she does not appear to have significant arterial insufficiency. Patient is not a diabetic. She does have a history of Raynaud's phenomenon which is an isolated event not associated with any rheumatologic disease. 10/24/15; patient tolerated last week's dressing well. No new complaints. Using Prisma under Kerlix Coban 10/31/15; patient continued to tolerate her dressing well which was Prisma Kerlix Coban wound has healed. This is her second go around with wounds on her legs. She apparently had wounds on by her bilateral anterior lower legs in December 16 after hitting them on a  lawnmower bag. She tells me she has below-knee pressure stockings at home but doesn't wear them during the summer. I am doubtful I am going to be able to talk her into that now. She has known severe venous insufficiency and has had ablations in the past READMISSION 08/27/2021 This is an 88 year old woman with a past medical history significant for venous insufficiency, COPD, and  Raynaud's phenomenon. She suffered a deep contusion to her left anterior tibial surface that resulted in a hematoma after an object fell from about 6 feet high and struck her in the leg. She presented to the emergency department on July 24, 2021. They did not evacuate the hematoma. They gave her bacitracin to apply. She has been doing so since that time. She is not a diabetic and does not smoke. The wound has developed a heavy layer of eschar over the surface. She is here today for further evaluation and management. She says that generally, it is not painful unless she is standing for prolonged periods of time. ABI in clinic today was noncompressible. 09/03/2021: The wound looks very good today. The surface is clean with just a little bit of slough accumulation. There is a nice bed of granulation tissue on the surface. She has a bud of epithelialized tissue in the midportion of the wound. 09/10/2021: The wound continues to contract. The [place] of epithelialized tissue in the midportion of the wound has expanded and there is a new epithelialized area arising just adjacent to it. The wound surface is clean without any slough accumulation. Good granulation tissue present. 09/17/2021: The wound is smaller again today. The 2 islands of epithelialized tissue have merged. The surface is clean without any slough accumulation and the granulation tissue is robust and healthy. No concern for infection. 09/24/2021: The wound continues to contract and epithelialize. She says that she does not like the [facility] because it seems to pull  tissue off when the dressing is changed. She is also wondering if we could change her dressing to one that she could do at home. The wound itself is quite a bit smaller with excellent epithelialization. The open areas are flat and flush with the surrounding skin and have a nice layer of granulation tissue present. Patient History Information obtained from Patient, Chart. Family History Cancer - Mother, Heart Disease - Mother,Father,Siblings, Hypertension - Mother,Father,Siblings, Thyroid Problems - Mother, No family history of Diabetes, Hereditary Spherocytosis, Kidney Disease, Lung Disease, Stroke, Tuberculosis. Social History Former smoker - quit in 1990, Marital Status - Widowed, Alcohol Use - Moderate - 3 glasses of wine weekly, Drug Use - No History, Caffeine Use - Daily - coffee. Medical History Eyes Patient has history of Cataracts Denies history of Glaucoma, Optic Neuritis Ear/Nose/Mouth/Throat Patient has history of Middle ear problems Denies history of Chronic sinus problems/congestion Hematologic/Lymphatic Denies history of Anemia, Hemophilia, Human Immunodeficiency Virus, Lymphedema, Sickle Cell Disease Respiratory Patient has history of Chronic Obstructive Pulmonary Disease (COPD) Denies history of Aspiration, Asthma, Pneumothorax, Sleep Apnea, Tuberculosis Cardiovascular Patient has history of Angina, Hypertension Denies history of Arrhythmia, Congestive Heart Failure, Coronary Artery Disease, Deep Vein Thrombosis, Hypotension, Myocardial Infarction, Peripheral Arterial Disease, Peripheral Venous Disease, Phlebitis, Vasculitis Gastrointestinal Denies history of Cirrhosis , Colitis, Crohnoos, Hepatitis A, Hepatitis B, Hepatitis C Endocrine Denies history of Type I Diabetes, Type II Diabetes Genitourinary Denies history of End Stage Renal Disease Immunological Patient has history of Raynaudoos Denies history of Lupus Erythematosus, Scleroderma Integumentary  (Skin) Denies history of History of Burn Musculoskeletal Denies history of Gout, Rheumatoid Arthritis, Osteoarthritis, Osteomyelitis Neurologic Denies history of Dementia, Neuropathy, Quadriplegia, Paraplegia, Seizure Disorder Oncologic Patient has history of Received Chemotherapy - due to breast ca Denies history of Received Radiation Psychiatric Denies history of Anorexia/bulimia, Confinement Anxiety Hospitalization/Surgery History - Bone spurs. - hysterectomy. - mastectomy. - back surgery. - back injury. Medical A Surgical History Notes nd Constitutional Symptoms (General Health) recent  laser ablation of (L) small saphenous vein due to varicosities , thyroid disease , plantars fasciitis , h/o shingles Hematologic/Lymphatic hemachromatosis Respiratory h/o pneumonia Gastrointestinal irritable bowel syndrome , GERD , spastic colon Oncologic (R) breast cancer Objective Constitutional Hypertensive, asymptomatic. No acute distress.. Vitals Time Taken: 11:22 AM, Height: 62 in, Weight: 109 lbs, BMI: 19.9, Temperature: 97.8 F, Pulse: 61 bpm, Respiratory Rate: 16 breaths/min, Blood Pressure: 161/57 mmHg. Respiratory Normal work of breathing on room air.. General Notes: 09/24/2021: The wound is quite a bit smaller with excellent epithelialization. The open areas are flat and flush with the surrounding skin and have a nice layer of granulation tissue present. Integumentary (Hair, Skin) Wound #2 status is Open. Original cause of wound was Hematoma. The date acquired was: 07/24/2021. The wound has been in treatment 4 weeks. The wound is located on the Left,Anterior Lower Leg. The wound measures 3.4cm length x 2.1cm width x 0.1cm depth; 5.608cm^2 area and 0.561cm^3 volume. There is Fat Layer (Subcutaneous Tissue) exposed. There is no tunneling or undermining noted. There is a medium amount of serosanguineous drainage noted. There is large (67-100%) red, friable, hyper - granulation within  the wound bed. There is a small (1-33%) amount of necrotic tissue within the wound bed including Adherent Slough. Assessment Active Problems ICD-10 Non-pressure chronic ulcer of other part of left lower leg with fat layer exposed Contusion of left lower leg, sequela Chronic obstructive pulmonary disease, unspecified Venous insufficiency (chronic) (peripheral) Plan Follow-up Appointments: Return Appointment in 1 week. - Dr Celine Ahr Room 3 Bathing/ Shower/ Hygiene: Other Bathing/Shower/Hygiene Orders/Instructions: - Please take sponge baths.Please do not get Left leg dressing wet. Edema Control - Lymphedema / SCD / Other: Avoid standing for long periods of time. Moisturize legs daily. WOUND #2: - Lower Leg Wound Laterality: Left, Anterior Cleanser: Soap and Water 1 x Per Week/15 Days Discharge Instructions: May shower and wash wound with dial antibacterial soap and water prior to dressing change. Cleanser: Wound Cleanser (DME) (Generic) 1 x Per Week/15 Days Discharge Instructions: Cleanse the wound with wound cleanser prior to applying a clean dressing using gauze sponges, not tissue or cotton balls. Prim Dressing: Promogran Prisma Matrix, 4.34 (sq in) (silver collagen) (DME) (Generic) 1 x Per Week/15 Days ary Discharge Instructions: Moisten collagen with saline or hydrogel Secondary Dressing: ABD Pad, 8x10 (DME) (Generic) 1 x Per Week/15 Days Discharge Instructions: Apply over primary dressing as directed. Secondary Dressing: Woven Gauze Sponge, Non-Sterile 4x4 in (DME) (Generic) 1 x Per Week/15 Days Discharge Instructions: Apply over primary dressing as directed. Secured With: Elastic Bandage 4 inch (ACE bandage) (DME) (Generic) 1 x Per Week/15 Days Discharge Instructions: Secure with ACE bandage as directed. Secured With: [employer], 4.5x3.1 (in/yd) (DME) (Generic) 1 x Per Week/15 Days Discharge Instructions: Secure with Kerlix as directed. Secured With: 65M Medipore Warehouse manager Surgical T 2x10 (in/yd) (DME) (Generic) 1 x Per Week/15 Days ape Discharge Instructions: Secure with tape as directed. 09/24/2021: The wound is quite a bit smaller with excellent epithelialization. The open areas are flat and flush with the surrounding skin and have a nice layer of granulation tissue present. No debridement was necessary today. We will change her dressing to Prisma silver collagen with Kerlix and ace wrap so that she can change it at home and shower as she would like. Follow-up in 1 week. Electronic Signature(s) Signed: 09/24/2021 11:58:45 AM By: Fredirick Maudlin MD FACS Entered By: Fredirick Maudlin on 09/24/2021 11:58:45 -------------------------------------------------------------------------------- HxROS Details Patient Name: Date of Service: HA  [place], [place] J. 09/24/2021 11:00 A M Medical Record Number: [id number] Patient Account Number: [id number] Date of Birth/Sex: Treating RN: Mar 06, 1931 (88 y.o. [name] Primary Care Provider: [name] Other Clinician: Referring Provider: Treating Provider/Extender: [name] in Treatment: 4 Label Progress Note Print Version as History and Physical for this encounter Information Obtained From Patient Chart Constitutional Symptoms (General Health) Medical History: Past Medical History Notes: recent laser ablation of (L) small saphenous vein due to varicosities , thyroid disease , plantars fasciitis , h/o shingles Eyes Medical History: Positive for: Cataracts Negative for: Glaucoma; Optic Neuritis Ear/Nose/Mouth/Throat Medical History: Positive for: Middle ear problems Negative for: Chronic sinus problems/congestion Hematologic/Lymphatic Medical History: Negative for: Anemia; Hemophilia; Human Immunodeficiency Virus; Lymphedema; Sickle Cell Disease Past Medical History Notes: hemachromatosis Respiratory Medical History: Positive for: Chronic Obstructive Pulmonary Disease  (COPD) Negative for: Aspiration; Asthma; Pneumothorax; Sleep Apnea; Tuberculosis Past Medical History Notes: h/o pneumonia Cardiovascular Medical History: Positive for: Angina; Hypertension Negative for: Arrhythmia; Congestive Heart Failure; Coronary Artery Disease; Deep Vein Thrombosis; Hypotension; Myocardial Infarction; Peripheral Arterial Disease; Peripheral Venous Disease; Phlebitis; Vasculitis Gastrointestinal Medical History: Negative for: Cirrhosis ; Colitis; Crohns; Hepatitis A; Hepatitis B; Hepatitis C Past Medical History Notes: irritable bowel syndrome , GERD , spastic colon Endocrine Medical History: Negative for: Type I Diabetes; Type II Diabetes Genitourinary Medical History: Negative for: End Stage Renal Disease Immunological Medical History: Positive for: Raynauds Negative for: Lupus Erythematosus; Scleroderma Integumentary (Skin) Medical History: Negative for: History of Burn Musculoskeletal Medical History: Negative for: Gout; Rheumatoid Arthritis; Osteoarthritis; Osteomyelitis Neurologic Medical History: Negative for: Dementia; Neuropathy; Quadriplegia; Paraplegia; Seizure Disorder Oncologic Medical History: Positive for: Received Chemotherapy - due to breast ca Negative for: Received Radiation Past Medical History Notes: (R) breast cancer Psychiatric Medical History: Negative for: Anorexia/bulimia; Confinement Anxiety HBO Extended History Items Eyes: Ear/Nose/Mouth/Throat: Cataracts Middle ear problems Immunizations Pneumococcal Vaccine: Received Pneumococcal Vaccination: Yes Received Pneumococcal Vaccination On or After 60th Birthday: Yes Tetanus Vaccine: Last tetanus shot: 04/05/2015 Implantable Devices No devices added Hospitalization / Surgery History Type of Hospitalization/Surgery Bone spurs hysterectomy mastectomy back surgery back injury Family and Social History Cancer: Yes - Mother; Diabetes: No; Heart Disease: Yes -  Mother,Father,Siblings; Hereditary Spherocytosis: No; Hypertension: Yes - Mother,Father,Siblings; Kidney Disease: No; Lung Disease: No; Stroke: No; Thyroid Problems: Yes - Mother; Tuberculosis: No; Former smoker - quit in 1990; Marital Status - Widowed; Alcohol Use: Moderate - 3 glasses of wine weekly; Drug Use: No History; Caffeine Use: Daily - coffee; Financial Concerns: No; Food, Clothing or Shelter Needs: No; Support System Lacking: No; Transportation Concerns: No Electronic Signature(s) Signed: 09/24/2021 11:59:14 AM By: Fredirick Maudlin MD FACS Signed: 09/24/2021 12:19:31 PM By: Dellie Catholic RN Entered By: Fredirick Maudlin on 09/24/2021 11:57:10 -------------------------------------------------------------------------------- SuperBill Details Patient Name: Date of Service: [name], [place] J. 09/24/2021 Medical Record Number: [id number] Patient Account Number: [id number] Date of Birth/Sex: Treating RN: [birth year] (88 y.o. [name] Primary Care Provider: [name] Other Clinician: Referring Provider: Treating Provider/Extender: [name] in Treatment: 4 Diagnosis Coding ICD-10 Codes Code Description [id number] Non-pressure chronic ulcer of other part of left lower leg with fat layer exposed S80.12XS Contusion of left lower leg, sequela J44.9 Chronic obstructive pulmonary disease, unspecified I87.2 Venous insufficiency (chronic) (peripheral) Facility Procedures CPT4 Code: [id number] Description: 99213 - WOUND CARE VISIT-LEV 3 EST PT Modifier: Quantity: 1 Physician Procedures : CPT4 Code Description Modifier [id number] - WC PHYS LEVEL 3 - EST PT ICD-10 Diagnosis Description L97.822 Non-pressure  chronic ulcer of other part of left lower leg with fat layer exposed S80.12XS Contusion of left lower leg, sequela J44.9 Chronic  obstructive pulmonary disease, unspecified I87.2 Venous insufficiency (chronic) (peripheral) Quantity: 1 Electronic  Signature(s) Signed: 09/24/2021 12:19:31 PM By: Dellie Catholic RN Signed: 09/24/2021 12:42:49 PM By: Fredirick Maudlin MD FACS Previous Signature: 09/24/2021 11:59:02 AM Version By: Fredirick Maudlin MD FACS Entered By: Dellie Catholic on 09/24/2021 12:18:33

## 2021-09-24 NOTE — Progress Notes (Signed)
OCEANA, WALTHALL ([id number]) Visit Report for 09/24/2021 Arrival Information Details Patient Name: Date of Service: Brandy Peterson, [place] Brandy Peterson 09/24/2021 11:00 A M Medical Record Number: [id number] Patient Account Number: [id number] Date of Birth/Sex: Treating RN: Sep 14, 1930 (88 y.o. Brandy Peterson Primary Care Brandy Peterson: [name] Other Clinician: Referring Brandy Peterson: Treating Brandy Peterson/Extender: [name] in Treatment: 4 Visit Information History Since Last Visit Added or deleted any medications: No Patient Arrived: Ambulatory Any new allergies or adverse reactions: No Arrival Time: 11:23 Had a fall or experienced change in No Accompanied By: self activities of daily living that may affect Transfer Assistance: None risk of falls: Patient Identification Verified: Yes Signs or symptoms of abuse/neglect since last visito No Patient Has Alerts: Yes Hospitalized since last visit: No Patient Alerts: ABI L [place] Has Dressing in Place as Prescribed: Yes Pain Present Now: No Electronic Signature(s) Signed: 09/24/2021 12:19:31 PM By: Dellie Catholic RN Entered By: Dellie Catholic on 09/24/2021 11:50:09 -------------------------------------------------------------------------------- Clinic Level of Care Assessment Details Patient Name: Date of Service: Brandy [name] J. 09/24/2021 11:00 A M Medical Record Number: [id number] Patient Account Number: [id number] Date of Birth/Sex: Treating RN: June 22, 1930 (88 y.o. Brandy Peterson Primary Care Brandy Peterson: [name] Other Clinician: Referring Brandy Peterson: Treating Brandy Peterson/Extender: [name] in Treatment: 4 Clinic Level of Care Assessment Items TOOL 4 Quantity Score X- 1 0 Use when only an EandM is performed on FOLLOW-UP visit ASSESSMENTS - Nursing Assessment / Reassessment X- 1 10 Reassessment of Co-morbidities (includes updates in patient status) X- 1 5 Reassessment of Adherence to Treatment  Plan ASSESSMENTS - Wound and Skin A ssessment / Reassessment X - Simple Wound Assessment / Reassessment - one wound 1 5 '[]'$  - 0 Complex Wound Assessment / Reassessment - multiple wounds '[]'$  - 0 Dermatologic / Skin Assessment (not related to wound area) ASSESSMENTS - Focused Assessment '[]'$  - 0 Circumferential Edema Measurements - multi extremities '[]'$  - 0 Nutritional Assessment / Counseling / Intervention '[]'$  - 0 Lower Extremity Assessment (monofilament, tuning fork, pulses) '[]'$  - 0 Peripheral Arterial Disease Assessment (using hand held doppler) ASSESSMENTS - Ostomy and/or Continence Assessment and Care '[]'$  - 0 Incontinence Assessment and Management '[]'$  - 0 Ostomy Care Assessment and Management (repouching, etc.) PROCESS - Coordination of Care X - Simple Patient / Family Education for ongoing care 1 15 '[]'$  - 0 Complex (extensive) Patient / Family Education for ongoing care X- 1 10 Staff obtains Programmer, systems, Records, T Results / Process Orders est X- 1 10 Staff telephones HHA, Nursing Homes / Clarify orders / etc '[]'$  - 0 Routine Transfer to another Facility (non-emergent condition) '[]'$  - 0 Routine Hospital Admission (non-emergent condition) '[]'$  - 0 New Admissions / Biomedical engineer / Ordering NPWT Apligraf, etc. , '[]'$  - 0 Emergency Hospital Admission (emergent condition) X- 1 10 Simple Discharge Coordination '[]'$  - 0 Complex (extensive) Discharge Coordination PROCESS - Special Needs '[]'$  - 0 Pediatric / Minor Patient Management '[]'$  - 0 Isolation Patient Management '[]'$  - 0 Hearing / Language / Visual special needs '[]'$  - 0 Assessment of Community assistance (transportation, D/C planning, etc.) '[]'$  - 0 Additional assistance / Altered mentation '[]'$  - 0 Support Surface(s) Assessment (bed, cushion, seat, etc.) INTERVENTIONS - Wound Cleansing / Measurement '[]'$  - 0 Simple Wound Cleansing - one wound '[]'$  - 0 Complex Wound Cleansing - multiple wounds '[]'$  - 0 Wound Imaging (photographs  - any number of wounds) '[]'$  - 0 Wound Tracing (instead of photographs) '[]'$  - 0  Simple Wound Measurement - one wound '[]'$  - 0 Complex Wound Measurement - multiple wounds INTERVENTIONS - Wound Dressings X - Small Wound Dressing one or multiple wounds 1 10 '[]'$  - 0 Medium Wound Dressing one or multiple wounds '[]'$  - 0 Large Wound Dressing one or multiple wounds '[]'$  - 0 Application of Medications - topical '[]'$  - 0 Application of Medications - injection INTERVENTIONS - Miscellaneous '[]'$  - 0 External ear exam '[]'$  - 0 Specimen Collection (cultures, biopsies, blood, body fluids, etc.) '[]'$  - 0 Specimen(s) / Culture(s) sent or taken to Lab for analysis '[]'$  - 0 Patient Transfer (multiple staff / Civil Service fast streamer / Similar devices) '[]'$  - 0 Simple Staple / Suture removal (25 or less) '[]'$  - 0 Complex Staple / Suture removal (26 or more) '[]'$  - 0 Hypo / Hyperglycemic Management (close monitor of Blood Glucose) '[]'$  - 0 Ankle / Brachial Index (ABI) - do not check if billed separately X- 1 5 Vital Signs Has the patient been seen at the hospital within the last three years: Yes Total Score: 80 Level Of Care: New/Established - Level 3 Electronic Signature(s) Signed: 09/24/2021 12:19:31 PM By: Dellie Catholic RN Entered By: Dellie Catholic on 09/24/2021 12:18:24 -------------------------------------------------------------------------------- Encounter Discharge Information Details Patient Name: Date of Service: Brandy Peterson, [name] 09/24/2021 11:00 A M Medical Record Number: [id number] Patient Account Number: [id number] Date of Birth/Sex: Treating RN: 1931-02-04 (89 y.o. Brandy Peterson Primary Care Brandy Peterson: [name] Other Clinician: Referring Brandy Peterson: Treating Brandy Peterson/Extender: [name] in Treatment: 4 Encounter Discharge Information Items Discharge Condition: Stable Ambulatory Status: Ambulatory Discharge Destination: Home Transportation: Private Auto Accompanied By:  self Schedule Follow-up Appointment: Yes Clinical Summary of Care: Patient Declined Electronic Signature(s) Signed: 09/24/2021 12:19:31 PM By: Dellie Catholic RN Entered By: Dellie Catholic on 09/24/2021 12:19:03 -------------------------------------------------------------------------------- Lower Extremity Assessment Details Patient Name: Date of Service: Brandy Peterson, [place] J. 09/24/2021 11:00 A M Medical Record Number: [id number] Patient Account Number: [id number] Date of Birth/Sex: Treating RN: 01-26-1931 (88 y.o. Brandy Peterson Primary Care Michaelann Gunnoe: [name] Other Clinician: Referring Manisha Cancel: Treating Antwain Caliendo/Extender: [name] in Treatment: 4 Edema Assessment Assessed: [Left: No] [Right: No] Edema: [Left: Ye] [Right: s] Calf Left: Right: Point of Measurement: 25 cm From Medial Instep 32 cm Ankle Left: Right: Point of Measurement: 10 cm From Medial Instep 20.9 cm Electronic Signature(s) Signed: 09/24/2021 12:19:31 PM By: Dellie Catholic RN Entered By: Dellie Catholic on 09/24/2021 11:24:11 -------------------------------------------------------------------------------- Multi Wound Chart Details Patient Name: Date of Service: Brandy Peterson, [place] J. 09/24/2021 11:00 A M Medical Record Number: [id number] Patient Account Number: [id number] Date of Birth/Sex: Treating RN: 08-20-1930 (88 y.o. Brandy Peterson Primary Care Sony Schlarb: [name] Other Clinician: Referring Blandon Offerdahl: Treating Zavion Sleight/Extender: [name] in Treatment: 4 Vital Signs Height(in): 45 Pulse(bpm): 59 Weight(lbs): 109 Blood Pressure(mmHg): 161/57 Body Mass Index(BMI): 19.9 Temperature(F): 97.8 Respiratory Rate(breaths/min): 16 Photos: [N/A:N/A] Left, Anterior Lower Leg N/A N/A Wound Location: Hematoma N/A N/A Wounding Event: Trauma, Other N/A N/A Primary Etiology: Cataracts, Middle ear problems, N/A N/A Comorbid History: Chronic  Obstructive Pulmonary Disease (COPD), Angina, Hypertension, Raynauds, Received Chemotherapy 07/24/2021 N/A N/A Date Acquired: 4 N/A N/A Weeks of Treatment: Open N/A N/A Wound Status: No N/A N/A Wound Recurrence: 3.4x2.1x0.1 N/A N/A Measurements L x W x D (cm) 5.608 N/A N/A A (cm) : rea 0.561 N/A N/A Volume (cm) : 83.30% N/A N/A % Reduction in Area: 91.60% N/A N/A % Reduction in Volume: Full Thickness  Without Exposed N/A N/A Classification: Support Structures Medium N/A N/A Exudate Amount: Serosanguineous N/A N/A Exudate Type: red, Peterson N/A N/A Exudate Color: Large (67-100%) N/A N/A Granulation Amount: Red, Hyper-granulation, Friable N/A N/A Granulation Quality: Small (1-33%) N/A N/A Necrotic Amount: Fat Layer (Subcutaneous Tissue): Yes N/A N/A Exposed Structures: Fascia: No Tendon: No Muscle: No Joint: No Bone: No Medium (34-66%) N/A N/A Epithelialization: Treatment Notes Electronic Signature(s) Signed: 09/24/2021 11:55:26 AM By: Fredirick Maudlin MD FACS Signed: 09/24/2021 12:19:31 PM By: Dellie Catholic RN Entered By: Fredirick Maudlin on 09/24/2021 11:55:25 -------------------------------------------------------------------------------- Multi-Disciplinary Care Plan Details Patient Name: Date of Service: Brandy Peterson, [place] J. 09/24/2021 11:00 A M Medical Record Number: [id number] Patient Account Number: [id number] Date of Birth/Sex: Treating RN: 02-25-1931 (88 y.o. Brandy Peterson Primary Care Markeda Narvaez: [name] Other Clinician: Referring Macarthur Lorusso: Treating Joal Eakle/Extender: [name] in Treatment: 4 Active Inactive Wound/Skin Impairment Nursing Diagnoses: Impaired tissue integrity Goals: Patient/caregiver will verbalize understanding of skin care regimen Date Initiated: 08/27/2021 Target Resolution Date: 10/10/2021 Goal Status: Active Interventions: Assess ulceration(s) every visit Treatment Activities: Skin care  regimen initiated : 08/27/2021 Notes: Electronic Signature(s) Signed: 09/24/2021 12:19:31 PM By: Dellie Catholic RN Entered By: Dellie Catholic on 09/24/2021 12:17:25 -------------------------------------------------------------------------------- Pain Assessment Details Patient Name: Date of Service: Brandy Peterson, [place] J. 09/24/2021 11:00 A M Medical Record Number: [id number] Patient Account Number: [id number] Date of Birth/Sex: Treating RN: January 15, 1931 (88 y.o. Brandy Peterson Primary Care Keiri Solano: [name] Other Clinician: Referring Seema Blum: Treating Shanikqua Zarzycki/Extender: [name] in Treatment: 4 Active Problems Location of Pain Severity and Description of Pain Patient Has Paino No Site Locations Pain Management and Medication Current Pain Management: Electronic Signature(s) Signed: 09/24/2021 12:19:31 PM By: Dellie Catholic RN Entered By: Dellie Catholic on 09/24/2021 11:43:41 -------------------------------------------------------------------------------- Patient/Caregiver Education Details Patient Name: Date of Service: Brandy [place], [place] J. 6/14/2023andnbsp11:00 [place] Record Number: [id number] Patient Account Number: [id number] Date of Birth/Gender: Treating RN: April 17, 1930 (88 y.o. Brandy Peterson Primary Care Physician: [name] Other Clinician: Referring Physician: Treating Physician/Extender: [name] in Treatment: 4 Education Assessment Education Provided To: Patient Education Topics Provided Wound/Skin Impairment: Methods: Explain/Verbal Responses: Return demonstration correctly Electronic Signature(s) Signed: 09/24/2021 12:19:31 PM By: Dellie Catholic RN Entered By: Dellie Catholic on 09/24/2021 12:17:38 -------------------------------------------------------------------------------- Wound Assessment Details Patient Name: Date of Service: Brandy Peterson, [place] J. 09/24/2021 11:00 A M Medical Record Number:  [id number] Patient Account Number: [id number] Date of Birth/Sex: Treating RN: [birth year] (88 y.o. Brandy Peterson Primary Care Paxson Harrower: [name] Other Clinician: Referring Kyerra Vargo: Treating Gabbi Whetstone/Extender: [name] in Treatment: 4 Wound Status Wound Number: 2 Primary Trauma, Other Etiology: Wound Location: Left, Anterior Lower Leg Wound Open Wounding Event: Hematoma Status: Date Acquired: 07/24/2021 Comorbid Cataracts, Middle ear problems, Chronic Obstructive Pulmonary Weeks Of Treatment: 4 History: Disease (COPD), Angina, Hypertension, Raynauds, Received Clustered Wound: No Chemotherapy Photos Wound Measurements Length: (cm) 3.4 Width: (cm) 2.1 Depth: (cm) 0.1 Area: (cm) 5.608 Volume: (cm) 0.561 % Reduction in Area: 83.3% % Reduction in Volume: 91.6% Epithelialization: Medium (34-66%) Tunneling: No Undermining: No Wound Description Classification: Full Thickness Without Exposed Support Structures Exudate Amount: Medium Exudate Type: Serosanguineous Exudate Color: red, Peterson Foul Odor After Cleansing: No Slough/Fibrino Yes Wound Bed Granulation Amount: Large (67-100%) Exposed Structure Granulation Quality: Red, Hyper-granulation, Friable Fascia Exposed: No Necrotic Amount: Small (1-33%) Fat Layer (Subcutaneous Tissue) Exposed: Yes Necrotic Quality: Adherent Slough Tendon Exposed: No Muscle Exposed: No Joint Exposed: No  Bone Exposed: No Treatment Notes Wound #2 (Lower Leg) Wound Laterality: Left, Anterior Cleanser Soap and Water Discharge Instruction: May shower and wash wound with dial antibacterial soap and water prior to dressing change. Wound Cleanser Discharge Instruction: Cleanse the wound with wound cleanser prior to applying a clean dressing using gauze sponges, not tissue or cotton balls. Peri-Wound Care Topical Primary Dressing Promogran Prisma Matrix, 4.34 (sq in) (silver collagen) Discharge Instruction:  Moisten collagen with saline or hydrogel Secondary Dressing ABD Pad, 8x10 Discharge Instruction: Apply over primary dressing as directed. Woven Gauze Sponge, Non-Sterile 4x4 in Discharge Instruction: Apply over primary dressing as directed. Secured With Elastic Bandage 4 inch (ACE bandage) Discharge Instruction: Secure with ACE bandage as directed. Kerlix Roll Sterile, 4.5x3.1 (in/yd) Discharge Instruction: Secure with Kerlix as directed. 54M Medipore Soft Cloth Surgical T 2x10 (in/yd) ape Discharge Instruction: Secure with tape as directed. Compression Wrap Compression Stockings Add-Ons Electronic Signature(s) Signed: 09/24/2021 12:19:31 PM By: Dellie Catholic RN Entered By: Dellie Catholic on 09/24/2021 11:43:28 -------------------------------------------------------------------------------- Vitals Details Patient Name: Date of Service: Brandy Peterson, [place] J. 09/24/2021 11:00 A M Medical Record Number: [id number] Patient Account Number: [id number] Date of Birth/Sex: Treating RN: 04-Apr-1931 (88 y.o. Brandy Peterson Primary Care Philippa Vessey: [name] Other Clinician: Referring Rooney Gladwin: Treating Tamekia Rotter/Extender: [name] in Treatment: 4 Vital Signs Time Taken: 11:22 Temperature (F): 97.8 Height (in): 62 Pulse (bpm): 61 Weight (lbs): 109 Respiratory Rate (breaths/min): 16 Body Mass Index (BMI): 19.9 Blood Pressure (mmHg): 161/57 Reference Range: 80 - 120 mg / dl Electronic Signature(s) Signed: 09/24/2021 12:19:31 PM By: Dellie Catholic RN Entered By: Dellie Catholic on 09/24/2021 11:42:18

## 2021-10-01 ENCOUNTER — Encounter (HOSPITAL_BASED_OUTPATIENT_CLINIC_OR_DEPARTMENT_OTHER): Payer: Medicare Other | Admitting: General Surgery

## 2021-10-01 DIAGNOSIS — I872 Venous insufficiency (chronic) (peripheral): Secondary | ICD-10-CM | POA: Diagnosis not present

## 2021-10-01 DIAGNOSIS — Z853 Personal history of malignant neoplasm of breast: Secondary | ICD-10-CM | POA: Diagnosis not present

## 2021-10-01 DIAGNOSIS — S8012XS Contusion of left lower leg, sequela: Secondary | ICD-10-CM | POA: Diagnosis not present

## 2021-10-01 DIAGNOSIS — L97822 Non-pressure chronic ulcer of other part of left lower leg with fat layer exposed: Secondary | ICD-10-CM | POA: Diagnosis not present

## 2021-10-01 DIAGNOSIS — Z87891 Personal history of nicotine dependence: Secondary | ICD-10-CM | POA: Diagnosis not present

## 2021-10-01 DIAGNOSIS — J449 Chronic obstructive pulmonary disease, unspecified: Secondary | ICD-10-CM | POA: Diagnosis not present

## 2021-10-01 NOTE — Progress Notes (Signed)
AMORETTE, CHARRETTE ([id number]) Visit Report for 10/01/2021 Chief Complaint Document Details Patient Name: Date of Service: [name], [place] [name] 10/01/2021 2:30 PM Medical Record Number: [id number] Patient Account Number: [id number] Date of Birth/Sex: Treating RN: [birth year] (88 y.o. [name] Primary Care Provider: [name] Other Clinician: Referring Provider: Treating Provider/Extender: [name] in Treatment: 5 Information Obtained from: Patient Chief Complaint Patient is here for a wound on her left lower extremity which is been present for one month Electronic Signature(s) Signed: 10/01/2021 3:29:26 PM By: Fredirick Maudlin MD FACS Entered By: Fredirick Maudlin on 10/01/2021 15:29:26 -------------------------------------------------------------------------------- Debridement Details Patient Name: Date of Service: [name], [place]. 10/01/2021 2:30 PM Medical Record Number: [id number] Patient Account Number: [id number] Date of Birth/Sex: Treating RN: 1930/10/02 (88 y.o. [name] Primary Care Provider: [name] Other Clinician: Referring Provider: Treating Provider/Extender: [name] in Treatment: 5 Debridement Performed for Assessment: Wound #2 Left,Anterior Lower Leg Performed By: Physician Fredirick Maudlin, MD Debridement Type: Debridement Level of Consciousness (Pre-procedure): Awake and Alert Pre-procedure Verification/Time Out Yes - 15:15 Taken: Start Time: 15:15 Pain Control: Lidocaine 4% T opical Solution T Area Debrided (L x W): otal 2.8 (cm) x 1.2 (cm) = 3.36 (cm) Tissue and other material debrided: Non-Viable, Eschar, Slough, Subcutaneous, Slough Level: Skin/Subcutaneous Tissue Debridement Description: Excisional Instrument: Curette Bleeding: Minimum Hemostasis Achieved: Pressure End Time: 15:16 Procedural Pain: 0 Post Procedural Pain: 0 Response to Treatment: Procedure was tolerated well Level of  Consciousness (Post- Awake and Alert procedure): Post Debridement Measurements of Total Wound Length: (cm) 2.8 Width: (cm) 1.2 Depth: (cm) 0.1 Volume: (cm) 0.264 Character of Wound/Ulcer Post Debridement: Improved Post Procedure Diagnosis Same as Pre-procedure Electronic Signature(s) Signed: 10/01/2021 3:36:19 PM By: Fredirick Maudlin MD FACS Signed: 10/01/2021 6:36:02 PM By: Dellie Catholic RN Entered By: Dellie Catholic on 10/01/2021 15:18:25 -------------------------------------------------------------------------------- HPI Details Patient Name: Date of Service: [name], [place] J. 10/01/2021 2:30 PM Medical Record Number: [id number] Patient Account Number: [id number] Date of Birth/Sex: Treating RN: 08-14-1930 (88 y.o. [name] Primary Care Provider: [name] Other Clinician: Referring Provider: Treating Provider/Extender: [name] in Treatment: 5 History of Present Illness HPI Description: 10/17/15; this is a patient who has a history of chronic Venous insufficiency. Previously known to vascular surgery. She has had an ablation her left lesser saphenous vein in late 2016 and she had stab phlebectomy of residual varicosities in the left leg as well. She tells me in terms of wounds that she traumatized her anterior legs bilaterally on the bag collector of her 1 more last year which took a long period of time to heal. Her current wound happen when she dropped her billfold on the anterior leg roughly a month ago. Since then she has been using Neosporin Vaseline, nonstick pads and Kerlix wraps. Her ABIs are 1.3 on the left 1.27 on the right she does not appear to have significant arterial insufficiency. Patient is not a diabetic. She does have a history of Raynaud's phenomenon which is an isolated event not associated with any rheumatologic disease. 10/24/15; patient tolerated last week's dressing well. No new complaints. Using Prisma under Kerlix  Coban 10/31/15; patient continued to tolerate her dressing well which was Prisma Kerlix Coban wound has healed. This is her second go around with wounds on her legs. She apparently had wounds on by her bilateral anterior lower legs in December 16 after hitting them on a lawnmower bag. She tells me  she has below-knee pressure stockings at home but doesn't wear them during the summer. I am doubtful I am going to be able to talk her into that now. She has known severe venous insufficiency and has had ablations in the past READMISSION 08/27/2021 This is an 88 year old woman with a past medical history significant for venous insufficiency, COPD, and Raynaud's phenomenon. She suffered a deep contusion to her left anterior tibial surface that resulted in a hematoma after an object fell from about 6 feet high and struck her in the leg. She presented to the emergency department on July 24, 2021. They did not evacuate the hematoma. They gave her bacitracin to apply. She has been doing so since that time. She is not a diabetic and does not smoke. The wound has developed a heavy layer of eschar over the surface. She is here today for further evaluation and management. She says that generally, it is not painful unless she is standing for prolonged periods of time. ABI in clinic today was noncompressible. 09/03/2021: The wound looks very good today. The surface is clean with just a little bit of slough accumulation. There is a nice bed of granulation tissue on the surface. She has a bud of epithelialized tissue in the midportion of the wound. 09/10/2021: The wound continues to contract. The [place] of epithelialized tissue in the midportion of the wound has expanded and there is a new epithelialized area arising just adjacent to it. The wound surface is clean without any slough accumulation. Good granulation tissue present. 09/17/2021: The wound is smaller again today. The 2 islands of epithelialized tissue have merged.  The surface is clean without any slough accumulation and the granulation tissue is robust and healthy. No concern for infection. 09/24/2021: The wound continues to contract and epithelialize. She says that she does not like the [facility] because it seems to pull tissue off when the dressing is changed. She is also wondering if we could change her dressing to one that she could do at home. The wound itself is quite a bit smaller with excellent epithelialization. The open areas are flat and flush with the surrounding skin and have a nice layer of granulation tissue present. 10/01/2021: The wound is smaller again this week. There is still some accumulated slough and periwound eschar, along with some adherent dead skin. The wound surface itself is viable with good granulation tissue. Electronic Signature(s) Signed: 10/01/2021 3:30:31 PM By: Fredirick Maudlin MD FACS Entered By: Fredirick Maudlin on 10/01/2021 15:30:31 -------------------------------------------------------------------------------- Physical Exam Details Patient Name: Date of Service: [name], [place] J. 10/01/2021 2:30 PM Medical Record Number: [id number] Patient Account Number: [id number] Date of Birth/Sex: Treating RN: 26-Apr-1930 (88 y.o. [name] Primary Care Provider: [name] Other Clinician: Referring Provider: Treating Provider/Extender: [name] in Treatment: 5 Constitutional Hypertensive, asymptomatic. . . . No acute distress.Marland Kitchen Respiratory Normal work of breathing on room air.. Notes 10/01/2021: The wound is smaller again this week. There is still some accumulated slough and periwound eschar, along with some adherent dead skin. The wound surface itself is viable with good granulation tissue. Electronic Signature(s) Signed: 10/01/2021 3:32:57 PM By: Fredirick Maudlin MD FACS Entered By: Fredirick Maudlin on 10/01/2021  15:32:56 -------------------------------------------------------------------------------- Physician Orders Details Patient Name: Date of Service: [name], [place] J. 10/01/2021 2:30 PM Medical Record Number: [id number] Patient Account Number: [id number] Date of Birth/Sex: Treating RN: [birth year] (88 y.o. [name] Primary Care Provider: [name] Other Clinician: Referring Provider: Treating  Provider/Extender: [name] in Treatment: 5 Verbal / Phone Orders: No Diagnosis Coding ICD-10 Coding Code Description [id number] Non-pressure chronic ulcer of other part of left lower leg with fat layer exposed S80.12XS Contusion of left lower leg, sequela J44.9 Chronic obstructive pulmonary disease, unspecified I87.2 Venous insufficiency (chronic) (peripheral) Follow-up Appointments ppointment in 1 week. - Dr Celine Ahr Room 3 Wednesday June 28 at 2:30pm Return A Licensed conveyancer Other Bathing/Shower/Hygiene Orders/Instructions: - Please take sponge baths.Please do not get Left leg dressing wet. Edema Control - Lymphedema / SCD / Other Bilateral Lower Extremities Avoid standing for long periods of time. Moisturize legs daily. Wound Treatment Wound #2 - Lower Leg Wound Laterality: Left, Anterior Cleanser: Soap and Water 1 x Per Week/15 Days Discharge Instructions: May shower and wash wound with dial antibacterial soap and water prior to dressing change. Cleanser: Wound Cleanser (Generic) 1 x Per Week/15 Days Discharge Instructions: Cleanse the wound with wound cleanser prior to applying a clean dressing using gauze sponges, not tissue or cotton balls. Prim Dressing: Promogran Prisma Matrix, 4.34 (sq in) (silver collagen) (Generic) 1 x Per Week/15 Days ary Discharge Instructions: Moisten collagen with saline or hydrogel Secondary Dressing: ABD Pad, 8x10 (Generic) 1 x Per Week/15 Days Discharge Instructions: Apply over primary dressing as  directed. Secondary Dressing: Woven Gauze Sponge, Non-Sterile 4x4 in (Generic) 1 x Per Week/15 Days Discharge Instructions: Apply over primary dressing as directed. Secured With: Elastic Bandage 4 inch (ACE bandage) (Generic) 1 x Per Week/15 Days Discharge Instructions: Secure with ACE bandage as directed. Secured With: [employer], 4.5x3.1 (in/yd) (Generic) 1 x Per Week/15 Days Discharge Instructions: Secure with Kerlix as directed. Secured With: 56M Medipore Public affairs consultant Surgical T 2x10 (in/yd) (Generic) 1 x Per Week/15 Days ape Discharge Instructions: Secure with tape as directed. Electronic Signature(s) Signed: 10/01/2021 3:33:19 PM By: Fredirick Maudlin MD FACS Entered By: Fredirick Maudlin on 10/01/2021 15:33:18 -------------------------------------------------------------------------------- Problem List Details Patient Name: Date of Service: [name], [place] J. 10/01/2021 2:30 PM Medical Record Number: [id number] Patient Account Number: [id number] Date of Birth/Sex: Treating RN: 10-17-1930 (88 y.o. [name] Primary Care Provider: [name] Other Clinician: Referring Provider: Treating Provider/Extender: [name] in Treatment: 5 Active Problems ICD-10 Encounter Code Description Active Date MDM Diagnosis [phone] Non-pressure chronic ulcer of other part of left lower leg with fat layer exposed5/17/2023 No Yes S80.12XS Contusion of left lower leg, sequela 08/27/2021 No Yes J44.9 Chronic obstructive pulmonary disease, unspecified 08/27/2021 No Yes I87.2 Venous insufficiency (chronic) (peripheral) 08/27/2021 No Yes Inactive Problems Resolved Problems Electronic Signature(s) Signed: 10/01/2021 3:29:02 PM By: Fredirick Maudlin MD FACS Entered By: Fredirick Maudlin on 10/01/2021 15:29:02 -------------------------------------------------------------------------------- Progress Note/History and Physical Details Patient Name: Date of Service: [name], [place] J. 10/01/2021 2:30 PM Medical Record Number: [id number] Patient Account Number: [id number] Date of Birth/Sex: Treating RN: 31-Oct-1930 (88 y.o. [name] Primary Care Provider: [name] Other Clinician: Referring Provider: Treating Provider/Extender: [name] in Treatment: 5 Subjective Chief Complaint Information obtained from Patient Patient is here for a wound on her left lower extremity which is been present for one month History of Present Illness (HPI) 10/17/15; this is a patient who has a history of chronic Venous insufficiency. Previously known to vascular surgery. She has had an ablation her left lesser saphenous vein in late 2016 and she had stab phlebectomy of residual varicosities in the left leg as well. She tells me in terms of wounds  that she traumatized her anterior legs bilaterally on the bag collector of her 1 more last year which took a long period of time to heal. Her current wound happen when she dropped her billfold on the anterior leg roughly a month ago. Since then she has been using Neosporin Vaseline, nonstick pads and Kerlix wraps. Her ABIs are 1.3 on the left 1.27 on the right she does not appear to have significant arterial insufficiency. Patient is not a diabetic. She does have a history of Raynaud's phenomenon which is an isolated event not associated with any rheumatologic disease. 10/24/15; patient tolerated last week's dressing well. No new complaints. Using Prisma under Kerlix Coban 10/31/15; patient continued to tolerate her dressing well which was Prisma Kerlix Coban wound has healed. This is her second go around with wounds on her legs. She apparently had wounds on by her bilateral anterior lower legs in December 16 after hitting them on a lawnmower bag. She tells me she has below-knee pressure stockings at home but doesn't wear them during the summer. I am doubtful I am going to be able to talk her into that now.  She has known severe venous insufficiency and has had ablations in the past READMISSION 08/27/2021 This is an 88 year old woman with a past medical history significant for venous insufficiency, COPD, and Raynaud's phenomenon. She suffered a deep contusion to her left anterior tibial surface that resulted in a hematoma after an object fell from about 6 feet high and struck her in the leg. She presented to the emergency department on July 24, 2021. They did not evacuate the hematoma. They gave her bacitracin to apply. She has been doing so since that time. She is not a diabetic and does not smoke. The wound has developed a heavy layer of eschar over the surface. She is here today for further evaluation and management. She says that generally, it is not painful unless she is standing for prolonged periods of time. ABI in clinic today was noncompressible. 09/03/2021: The wound looks very good today. The surface is clean with just a little bit of slough accumulation. There is a nice bed of granulation tissue on the surface. She has a bud of epithelialized tissue in the midportion of the wound. 09/10/2021: The wound continues to contract. The [place] of epithelialized tissue in the midportion of the wound has expanded and there is a new epithelialized area arising just adjacent to it. The wound surface is clean without any slough accumulation. Good granulation tissue present. 09/17/2021: The wound is smaller again today. The 2 islands of epithelialized tissue have merged. The surface is clean without any slough accumulation and the granulation tissue is robust and healthy. No concern for infection. 09/24/2021: The wound continues to contract and epithelialize. She says that she does not like the [facility] because it seems to pull tissue off when the dressing is changed. She is also wondering if we could change her dressing to one that she could do at home. The wound itself is quite a bit smaller  with excellent epithelialization. The open areas are flat and flush with the surrounding skin and have a nice layer of granulation tissue present. 10/01/2021: The wound is smaller again this week. There is still some accumulated slough and periwound eschar, along with some adherent dead skin. The wound surface itself is viable with good granulation tissue. Patient History Information obtained from Patient, Chart. Family History Cancer - Mother, Heart Disease - Mother,Father,Siblings, Hypertension - Mother,Father,Siblings, Thyroid Problems -  Mother, No family history of Diabetes, Hereditary Spherocytosis, Kidney Disease, Lung Disease, Stroke, Tuberculosis. Social History Former smoker - quit in 1990, Marital Status - Widowed, Alcohol Use - Moderate - 3 glasses of wine weekly, Drug Use - No History, Caffeine Use - Daily - coffee. Medical History Eyes Patient has history of Cataracts Denies history of Glaucoma, Optic Neuritis Ear/Nose/Mouth/Throat Patient has history of Middle ear problems Denies history of Chronic sinus problems/congestion Hematologic/Lymphatic Denies history of Anemia, Hemophilia, Human Immunodeficiency Virus, Lymphedema, Sickle Cell Disease Respiratory Patient has history of Chronic Obstructive Pulmonary Disease (COPD) Denies history of Aspiration, Asthma, Pneumothorax, Sleep Apnea, Tuberculosis Cardiovascular Patient has history of Angina, Hypertension Denies history of Arrhythmia, Congestive Heart Failure, Coronary Artery Disease, Deep Vein Thrombosis, Hypotension, Myocardial Infarction, Peripheral Arterial Disease, Peripheral Venous Disease, Phlebitis, Vasculitis Gastrointestinal Denies history of Cirrhosis , Colitis, Crohnoos, Hepatitis A, Hepatitis B, Hepatitis C Endocrine Denies history of Type I Diabetes, Type II Diabetes Genitourinary Denies history of End Stage Renal Disease Immunological Patient has history of Raynaudoos Denies history of Lupus  Erythematosus, Scleroderma Integumentary (Skin) Denies history of History of Burn Musculoskeletal Denies history of Gout, Rheumatoid Arthritis, Osteoarthritis, Osteomyelitis Neurologic Denies history of Dementia, Neuropathy, Quadriplegia, Paraplegia, Seizure Disorder Oncologic Patient has history of Received Chemotherapy - due to breast ca Denies history of Received Radiation Psychiatric Denies history of Anorexia/bulimia, Confinement Anxiety Hospitalization/Surgery History - Bone spurs. - hysterectomy. - mastectomy. - back surgery. - back injury. Medical A Surgical History Notes nd Constitutional Symptoms (General Health) recent laser ablation of (L) small saphenous vein due to varicosities , thyroid disease , plantars fasciitis , h/o shingles Hematologic/Lymphatic hemachromatosis Respiratory h/o pneumonia Gastrointestinal irritable bowel syndrome , GERD , spastic colon Oncologic (R) breast cancer Objective Constitutional Hypertensive, asymptomatic. No acute distress.. Vitals Time Taken: 2:59 PM, Height: 62 in, Weight: 109 lbs, BMI: 19.9, Temperature: 98.0 F, Pulse: 71 bpm, Respiratory Rate: 18 breaths/min, Blood Pressure: 182/76 mmHg. Respiratory Normal work of breathing on room air.. General Notes: 10/01/2021: The wound is smaller again this week. There is still some accumulated slough and periwound eschar, along with some adherent dead skin. The wound surface itself is viable with good granulation tissue. Integumentary (Hair, Skin) Wound #2 status is Open. Original cause of wound was Hematoma. The date acquired was: 07/24/2021. The wound has been in treatment 5 weeks. The wound is located on the Left,Anterior Lower Leg. The wound measures 2.8cm length x 1.2cm width x 0.1cm depth; 2.639cm^2 area and 0.264cm^3 volume. There is Fat Layer (Subcutaneous Tissue) exposed. There is no tunneling or undermining noted. There is a medium amount of serosanguineous drainage noted. There  is large (67-100%) red, friable granulation within the wound bed. There is a small (1-33%) amount of necrotic tissue within the wound bed including Adherent Slough. Assessment Active Problems ICD-10 Non-pressure chronic ulcer of other part of left lower leg with fat layer exposed Contusion of left lower leg, sequela Chronic obstructive pulmonary disease, unspecified Venous insufficiency (chronic) (peripheral) Procedures Wound #2 Pre-procedure diagnosis of Wound #2 is a Trauma, Other located on the Left,Anterior Lower Leg . There was a Excisional Skin/Subcutaneous Tissue Debridement with a total area of 3.36 sq cm performed by Fredirick Maudlin, MD. With the following instrument(s): Curette to remove Non-Viable tissue/material. Material removed includes Eschar, Subcutaneous Tissue, and Slough after achieving pain control using Lidocaine 4% T opical Solution. A time out was conducted at 15:15, prior to the start of the procedure. A Minimum amount of bleeding was controlled with Pressure. The  procedure was tolerated well with a pain level of 0 throughout and a pain level of 0 following the procedure. Post Debridement Measurements: 2.8cm length x 1.2cm width x 0.1cm depth; 0.264cm^3 volume. Character of Wound/Ulcer Post Debridement is improved. Post procedure Diagnosis Wound #2: Same as Pre-Procedure Plan Follow-up Appointments: Return Appointment in 1 week. - Dr Celine Ahr Room 3 Wednesday June 28 at 2:30pm Bathing/ Shower/ Hygiene: Other Bathing/Shower/Hygiene Orders/Instructions: - Please take sponge baths.Please do not get Left leg dressing wet. Edema Control - Lymphedema / SCD / Other: Avoid standing for long periods of time. Moisturize legs daily. WOUND #2: - Lower Leg Wound Laterality: Left, Anterior Cleanser: Soap and Water 1 x Per Week/15 Days Discharge Instructions: May shower and wash wound with dial antibacterial soap and water prior to dressing change. Cleanser: Wound Cleanser  (Generic) 1 x Per Week/15 Days Discharge Instructions: Cleanse the wound with wound cleanser prior to applying a clean dressing using gauze sponges, not tissue or cotton balls. Prim Dressing: Promogran Prisma Matrix, 4.34 (sq in) (silver collagen) (Generic) 1 x Per Week/15 Days ary Discharge Instructions: Moisten collagen with saline or hydrogel Secondary Dressing: ABD Pad, 8x10 (Generic) 1 x Per Week/15 Days Discharge Instructions: Apply over primary dressing as directed. Secondary Dressing: Woven Gauze Sponge, Non-Sterile 4x4 in (Generic) 1 x Per Week/15 Days Discharge Instructions: Apply over primary dressing as directed. Secured With: Elastic Bandage 4 inch (ACE bandage) (Generic) 1 x Per Week/15 Days Discharge Instructions: Secure with ACE bandage as directed. Secured With: [employer], 4.5x3.1 (in/yd) (Generic) 1 x Per Week/15 Days Discharge Instructions: Secure with Kerlix as directed. Secured With: 73M Medipore Public affairs consultant Surgical T 2x10 (in/yd) (Generic) 1 x Per Week/15 Days ape Discharge Instructions: Secure with tape as directed. 10/01/2021: The wound is smaller again this week. There is still some accumulated slough and periwound eschar, along with some adherent dead skin. The wound surface itself is viable with good granulation tissue. I used a curette to debride slough, nonviable skin, and subcutaneous tissue from the wound. We will continue using Prisma silver collagen with Kerlix and Ace. Follow-up in 1 week. Electronic Signature(s) Signed: 10/01/2021 3:34:24 PM By: Fredirick Maudlin MD FACS Entered By: Fredirick Maudlin on 10/01/2021 15:34:24 -------------------------------------------------------------------------------- HxROS Details Patient Name: Date of Service: [name], [place] J. 10/01/2021 2:30 PM Medical Record Number: [id number] Patient Account Number: [id number] Date of Birth/Sex: Treating RN: Aug 19, 1930 (88 y.o. [name] Primary Care Provider: [name] Other Clinician: Referring Provider: Treating Provider/Extender: [name] in Treatment: 5 Label Progress Note Print Version as History and Physical for this encounter Information Obtained From Patient Chart Constitutional Symptoms (General Health) Medical History: Past Medical History Notes: recent laser ablation of (L) small saphenous vein due to varicosities , thyroid disease , plantars fasciitis , h/o shingles Eyes Medical History: Positive for: Cataracts Negative for: Glaucoma; Optic Neuritis Ear/Nose/Mouth/Throat Medical History: Positive for: Middle ear problems Negative for: Chronic sinus problems/congestion Hematologic/Lymphatic Medical History: Negative for: Anemia; Hemophilia; Human Immunodeficiency Virus; Lymphedema; Sickle Cell Disease Past Medical History Notes: hemachromatosis Respiratory Medical History: Positive for: Chronic Obstructive Pulmonary Disease (COPD) Negative for: Aspiration; Asthma; Pneumothorax; Sleep Apnea; Tuberculosis Past Medical History Notes: h/o pneumonia Cardiovascular Medical History: Positive for: Angina; Hypertension Negative for: Arrhythmia; Congestive Heart Failure; Coronary Artery Disease; Deep Vein Thrombosis; Hypotension; Myocardial Infarction; Peripheral Arterial Disease; Peripheral Venous Disease; Phlebitis; Vasculitis Gastrointestinal Medical History: Negative for: Cirrhosis ; Colitis; Crohns; Hepatitis A; Hepatitis B; Hepatitis C Past Medical History Notes:  irritable bowel syndrome , GERD , spastic colon Endocrine Medical History: Negative for: Type I Diabetes; Type II Diabetes Genitourinary Medical History: Negative for: End Stage Renal Disease Immunological Medical History: Positive for: Raynauds Negative for: Lupus Erythematosus; Scleroderma Integumentary (Skin) Medical History: Negative for: History of Burn Musculoskeletal Medical History: Negative for: Gout; Rheumatoid  Arthritis; Osteoarthritis; Osteomyelitis Neurologic Medical History: Negative for: Dementia; Neuropathy; Quadriplegia; Paraplegia; Seizure Disorder Oncologic Medical History: Positive for: Received Chemotherapy - due to breast ca Negative for: Received Radiation Past Medical History Notes: (R) breast cancer Psychiatric Medical History: Negative for: Anorexia/bulimia; Confinement Anxiety HBO Extended History Items Eyes: Ear/Nose/Mouth/Throat: Cataracts Middle ear problems Immunizations Pneumococcal Vaccine: Received Pneumococcal Vaccination: Yes Received Pneumococcal Vaccination On or After 60th Birthday: Yes Tetanus Vaccine: Last tetanus shot: 04/05/2015 Implantable Devices No devices added Hospitalization / Surgery History Type of Hospitalization/Surgery Bone spurs hysterectomy mastectomy back surgery back injury Family and Social History Cancer: Yes - Mother; Diabetes: No; Heart Disease: Yes - Mother,Father,Siblings; Hereditary Spherocytosis: No; Hypertension: Yes - Mother,Father,Siblings; Kidney Disease: No; Lung Disease: No; Stroke: No; Thyroid Problems: Yes - Mother; Tuberculosis: No; Former smoker - quit in 1990; Marital Status - Widowed; Alcohol Use: Moderate - 3 glasses of wine weekly; Drug Use: No History; Caffeine Use: Daily - coffee; Financial Concerns: No; Food, Clothing or Shelter Needs: No; Support System Lacking: No; Transportation Concerns: No Electronic Signature(s) Signed: 10/01/2021 3:36:19 PM By: Fredirick Maudlin MD FACS Signed: 10/01/2021 6:36:02 PM By: Dellie Catholic RN Entered By: Fredirick Maudlin on 10/01/2021 15:32:33 -------------------------------------------------------------------------------- SuperBill Details Patient Name: Date of Service: [name], [place] J. 10/01/2021 Medical Record Number: [id number] Patient Account Number: [id number] Date of Birth/Sex: Treating RN: [birth year] (88 y.o. [name] Primary Care Provider: [name] Other Clinician: Referring Provider: Treating Provider/Extender: [name] in Treatment: 5 Diagnosis Coding ICD-10 Codes Code Description [phone] Non-pressure chronic ulcer of other part of left lower leg with fat layer exposed S80.12XS Contusion of left lower leg, sequela J44.9 Chronic obstructive pulmonary disease, unspecified I87.2 Venous insufficiency (chronic) (peripheral) Facility Procedures CPT4 Code: [id number] Description: 32549 - DEB SUBQ TISSUE 20 SQ CM/< ICD-10 Diagnosis Description L97.822 Non-pressure chronic ulcer of other part of left lower leg with fat layer expo Modifier: sed Quantity: 1 Physician Procedures : CPT4 Code Description Modifier [id number] - WC PHYS LEVEL 3 - EST PT 25 ICD-10 Diagnosis Description L97.822 Non-pressure chronic ulcer of other part of left lower leg with fat layer exposed S80.12XS Contusion of left lower leg, sequela I87.2 Venous  insufficiency (chronic) (peripheral) J44.9 Chronic obstructive pulmonary disease, unspecified Quantity: 1 : [id number] 11042 - WC PHYS SUBQ TISS 20 SQ CM 1 ICD-10 Diagnosis Description L97.822 Non-pressure chronic ulcer of other part of left lower leg with fat layer exposed Quantity: Electronic Signature(s) Signed: 10/01/2021 3:34:57 PM By: Fredirick Maudlin MD FACS Entered By: Fredirick Maudlin on 10/01/2021 15:34:56

## 2021-10-08 ENCOUNTER — Ambulatory Visit (HOSPITAL_BASED_OUTPATIENT_CLINIC_OR_DEPARTMENT_OTHER): Payer: Medicare Other | Admitting: General Surgery

## 2021-10-13 ENCOUNTER — Other Ambulatory Visit: Payer: Self-pay

## 2021-10-15 ENCOUNTER — Other Ambulatory Visit: Payer: Medicare Other

## 2021-10-15 ENCOUNTER — Inpatient Hospital Stay: Payer: Medicare Other | Attending: Hematology

## 2021-10-15 ENCOUNTER — Other Ambulatory Visit: Payer: Self-pay

## 2021-10-15 ENCOUNTER — Inpatient Hospital Stay: Payer: Medicare Other

## 2021-10-15 LAB — CBC WITH DIFFERENTIAL (CANCER CENTER ONLY)
Abs Immature Granulocytes: 0.02 10*3/uL (ref 0.00–0.07)
Basophils Absolute: 0 10*3/uL (ref 0.0–0.1)
Basophils Relative: 1 %
Eosinophils Absolute: 0.3 10*3/uL (ref 0.0–0.5)
Eosinophils Relative: 4 %
HCT: 40.4 % (ref 36.0–46.0)
Hemoglobin: 14.5 g/dL (ref 12.0–15.0)
Immature Granulocytes: 0 %
Lymphocytes Relative: 17 %
Lymphs Abs: 1.2 10*3/uL (ref 0.7–4.0)
MCH: 32.7 pg (ref 26.0–34.0)
MCHC: 35.9 g/dL (ref 30.0–36.0)
MCV: 91.2 fL (ref 80.0–100.0)
Monocytes Absolute: 0.7 10*3/uL (ref 0.1–1.0)
Monocytes Relative: 9 %
Neutro Abs: 5 10*3/uL (ref 1.7–7.7)
Neutrophils Relative %: 69 %
Platelet Count: 245 10*3/uL (ref 150–400)
RBC: 4.43 MIL/uL (ref 3.87–5.11)
RDW: 12.4 % (ref 11.5–15.5)
WBC Count: 7.1 10*3/uL (ref 4.0–10.5)
nRBC: 0 % (ref 0.0–0.2)

## 2021-10-15 LAB — IRON AND IRON BINDING CAPACITY (CC-WL,HP ONLY)
Iron: 144 ug/dL (ref 28–170)
Saturation Ratios: 45 % — ABNORMAL HIGH (ref 10.4–31.8)
TIBC: 321 ug/dL (ref 250–450)
UIBC: 177 ug/dL

## 2021-10-16 LAB — FERRITIN: Ferritin: 32 ng/mL (ref 11–307)

## 2021-10-17 ENCOUNTER — Telehealth: Payer: Self-pay

## 2021-10-17 NOTE — Telephone Encounter (Signed)
T/C from pt stating she saw her labs in My Chart  that her ferritin was at 32 and can we cancel her 7/18 phlebotomy appt.  Labs confirmed and appt cancelled.

## 2021-10-21 ENCOUNTER — Other Ambulatory Visit: Payer: Medicare Other

## 2021-11-24 ENCOUNTER — Telehealth: Payer: Self-pay | Admitting: Hematology

## 2021-11-24 NOTE — Telephone Encounter (Signed)
Left message with rescheduled upcoming appointment due to provider's template change.

## 2021-12-08 DIAGNOSIS — M67811 Other specified disorders of synovium, right shoulder: Secondary | ICD-10-CM | POA: Diagnosis not present

## 2021-12-24 ENCOUNTER — Telehealth: Payer: Self-pay | Admitting: Emergency Medicine

## 2021-12-24 NOTE — Telephone Encounter (Signed)
Patient called to ask if her upcoming appt.  Could be sooner because she is having a CT scan on 9/15 and her appt. Isn't until 10/11.  Please advise and call patient to let her know at [phone]

## 2021-12-26 ENCOUNTER — Encounter: Payer: Self-pay | Admitting: Internal Medicine

## 2021-12-26 ENCOUNTER — Ambulatory Visit
Admission: RE | Admit: 2021-12-26 | Discharge: 2021-12-26 | Disposition: A | Discharge status: Home / Self Care | Payer: Medicare Other | Source: Ambulatory Visit | Attending: Emergency Medicine | Admitting: Emergency Medicine

## 2021-12-26 DIAGNOSIS — R911 Solitary pulmonary nodule: Secondary | ICD-10-CM | POA: Diagnosis not present

## 2021-12-26 DIAGNOSIS — J439 Emphysema, unspecified: Secondary | ICD-10-CM | POA: Diagnosis not present

## 2021-12-29 ENCOUNTER — Other Ambulatory Visit: Payer: Self-pay | Admitting: Internal Medicine

## 2021-12-29 DIAGNOSIS — M25511 Pain in right shoulder: Secondary | ICD-10-CM | POA: Diagnosis not present

## 2021-12-29 DIAGNOSIS — M546 Pain in thoracic spine: Secondary | ICD-10-CM | POA: Diagnosis not present

## 2021-12-29 MED ORDER — METOPROLOL SUCCINATE ER 25 MG PO TB24: 25.0000 mg | ORAL_TABLET | Freq: Every day | ORAL | 0 refills | Status: DC

## 2021-12-29 NOTE — Telephone Encounter (Signed)
Called patient and advised her to keep her appointment with Dr Lamonte Sakai. I understand her CT scan appt date and her follow up. But I advised her that Dr Sudie Bailey schedule is very tight. But if we see anything new or worse on CT scan we will call her before her appt. Nothing further needed

## 2022-01-02 DIAGNOSIS — M546 Pain in thoracic spine: Secondary | ICD-10-CM | POA: Diagnosis not present

## 2022-01-07 DIAGNOSIS — M546 Pain in thoracic spine: Secondary | ICD-10-CM | POA: Diagnosis not present

## 2022-01-19 DIAGNOSIS — Z23 Encounter for immunization: Secondary | ICD-10-CM | POA: Diagnosis not present

## 2022-01-21 ENCOUNTER — Encounter: Payer: Self-pay | Admitting: Emergency Medicine

## 2022-01-21 ENCOUNTER — Ambulatory Visit (INDEPENDENT_AMBULATORY_CARE_PROVIDER_SITE_OTHER): Payer: Medicare Other | Admitting: Emergency Medicine

## 2022-01-21 VITALS — BP 136/74 | HR 66 | Temp 97.7°F | Ht 61.0 in | Wt 109.0 lb

## 2022-01-21 DIAGNOSIS — J449 Chronic obstructive pulmonary disease, unspecified: Secondary | ICD-10-CM

## 2022-01-21 DIAGNOSIS — I2510 Atherosclerotic heart disease of native coronary artery without angina pectoris: Secondary | ICD-10-CM | POA: Diagnosis not present

## 2022-01-21 DIAGNOSIS — R911 Solitary pulmonary nodule: Secondary | ICD-10-CM

## 2022-01-21 NOTE — Progress Notes (Signed)
Subjective:    Patient ID: Brandy Peterson, female    DOB: 22-Jun-1930, 88 y.o.   MRN: [id number]  HPI   ROV 12/26/20 --follow-up visit for 88 year old former smoker with a history of of probable mild COPD (has not been on therapy), allergic rhinitis, mild bronchiectasis.  She also has a stable 6 mm right middle lobe pulmonary nodule.  Today she describes persistent chronic L mid back pain, has been worked up by Neuro, has seen PT, pain management as well. May impact her breathing some - the pain can limit ability to deep breathe. She can get SOB when moving a large garbage can, yard waste. For the last 2 weeks she has been more SOB due to the back pain. No cough. She does feel a globus sensation over the last 2 weeks. She is not interested in starting a BD right now.  Not on any bronchodilator therapy.  She has fluticasone nasal spray, uses most days. Rare claritin-d.   CT chest 12/25/2020 reviewed by me, showed similar biapical pleural-parenchymal scarring with some associated calcification, mild centrilobular emphysema, chronic bronchiectatic change and airway plugging in the anterior right upper lobe, lingula.  The right middle lobe pulmonary nodule has not changed in size or appearance compared with 04/12/2019   ROV 01/21/22 -- 88 yo woman, former smoker, suspected mild COPD. Have followed her for globus sensation / cough, RLL pulm nodule and bronchiectasis on serial imaging. She is doing well, no cough currently. She does have some exertional SOB when she is lifting something heavy, resolves w rest. Happens with chores, lifting garbage etc. She still does yard work. No wheeze.   LDCT 12/26/21 reviewed by me shows no suspicious nodules, no change minor fissure small nodule.  She did have aortic and coronary calcifications.    Review of Systems As per HPi  Past Medical History:  Diagnosis Date   Backache, unspecified    Carcinoma of breast ([place])    Right   Complication of anesthesia    was  told to never use Sodium Pentothal - mother had reaction. Also had trouble with intubation during mastectomy   COPD (chronic obstructive pulmonary disease) (HCC)    Family history of adverse reaction to anesthesia    GERD (gastroesophageal reflux disease)    Hemochromatosis    History of shingles    Hypertension    Hypothyroidism    IBS (irritable bowel syndrome)    Plantar fasciitis    Pneumonia    Raynaud's syndrome    Spastic colon    hx of    Thyroid disease    Varicose veins      Family History  Problem Relation Age of Onset   Cancer Mother        metastatic cancer (pancreas vs lung)   Hypertension Mother    Osteoarthritis Mother    Hypothyroidism Mother    Varicose Veins Mother    Bleeding Disorder Mother    Heart disease Father        CAD/MI   Hypertension Father    Arthritis Father    Hemochromatosis Father        Possible   Deep vein thrombosis Father    Varicose Veins Father    Heart attack Father    Heart attack Brother    Hemochromatosis Brother    Heart disease Brother    Hyperlipidemia Son    Diabetes Neg Hx      Social History   Socioeconomic History   Marital  status: Widowed    Spouse name: Not on file   Number of children: 1   Years of education: 62   Highest education level: High school graduate  Occupational History   Occupation: Retired  Tobacco Use   Smoking status: Former    Types: Cigarettes    Quit date: 04/13/1988    Years since quitting: 33.7   Smokeless tobacco: Never   Tobacco comments:    approximate quit date  Vaping Use   Vaping Use: Never used  Substance and Sexual Activity   Alcohol use: Yes    Alcohol/week: 3.0 standard drinks of alcohol    Types: 3 Glasses of wine per week    Comment: 2oz of wine occasionally   Drug use: No   Sexual activity: Never  Other Topics Concern   Not on file  Social History Narrative   Programmer, systems, 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 Pt married in 1953 and was widowed in  2002. She now lives alone I-ADLs. Pt has 1 son born 54 and 2 grandchildren.       Pt had a colonoscopy on 05/09/2012 by Dr Oletta Lamas.       Pt reports having a pneumo vaccine.      Right-handed.   4-5 cups caffeine daily.   Lives alone.   Social Determinants of Health   Financial Resource Strain: Low Risk  (06/16/2017)   Overall Financial Resource Strain (CARDIA)    Difficulty of Paying Living Expenses: Not hard at all  Food Insecurity: No Food Insecurity (06/16/2017)   Hunger Vital Sign    Worried About Running Out of Food in the Last Year: Never true    Ran Out of Food in the Last Year: Never true  Transportation Needs: No Transportation Needs (06/16/2017)   PRAPARE - Hydrologist (Medical): No    Lack of Transportation (Non-Medical): No  Physical Activity: Inactive (06/16/2017)   Exercise Vital Sign    Days of Exercise per Week: 0 days    Minutes of Exercise per Session: 0 min  Stress: No Stress Concern Present (06/16/2017)   [place]    Feeling of Stress : Only a little  Social Connections: Unknown (06/16/2017)   Social Connection and Isolation Panel [NHANES]    Frequency of Communication with Friends and Family: More than three times a week    Frequency of Social Gatherings with Friends and Family: More than three times a week    Attends Religious Services: More than 4 times per year    Active Member of [employer] or Organizations: Not on file    Attends Archivist Meetings: Not on file    Marital Status: Widowed  Intimate Partner Violence: Not on file    Has always lived in [place] 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   Allergies  Allergen Reactions   Contrast Media [Iodinated Contrast Media] Hives, Itching and Other (See Comments)    Felt like she was going to faint    Adhesive [Tape] Other (See Comments)    Prefers paper tape   Sulfonamide Derivatives Other (See  Comments)    Reaction: unknown      Outpatient Medications Prior to Visit  Medication Sig Dispense Refill   levothyroxine (SYNTHROID) 88 MCG tablet Take 1 tablet (88 mcg total) by mouth daily before breakfast. 90 tablet 3   losartan (COZAAR) 50 MG tablet Take 1 tablet (50 mg total) by mouth  daily. 90 tablet 3   metoprolol succinate (TOPROL-XL) 25 MG 24 hr tablet Take 1 tablet (25 mg total) by mouth daily. 90 tablet 0   omeprazole (PRILOSEC) 40 MG capsule Take 1 capsule (40 mg total) by mouth daily. 90 capsule 3   triamcinolone (NASACORT) 55 MCG/ACT AERO nasal inhaler Place 2 sprays into the nose daily. 3 each 3   triamterene-hydrochlorothiazide (MAXZIDE-25) 37.5-25 MG tablet Take 1 tablet by mouth daily. 90 tablet 3   bacitracin ointment Apply 1 application. topically 2 (two) times daily. (Patient not taking: Reported on 01/21/2022) 120 g 0   No facility-administered medications prior to visit.         Objective:   Physical Exam Vitals:   01/21/22 1537  BP: 136/74  Pulse: 66  Temp: 97.7 F (36.5 C)  TempSrc: Oral  SpO2: 98%  Weight: 109 lb (49.4 kg)  Height: '5\' 1"'$  (1.549 m)   Gen: Pleasant, thin elderly woman, in no distress,  normal affect  ENT: No lesions,  mouth clear,  oropharynx clear, no postnasal drip  Neck: No JVD, no stridor  Lungs: No use of accessory muscles, no crackles or wheezing on normal respiration, no wheeze on forced expiration  Cardiovascular: RRR, heart sounds normal, no murmur or gallops, no peripheral edema  Musculoskeletal: No deformities, no cyanosis or clubbing  Neuro: alert, awake, non focal  Skin: Warm, no lesions or rash     Assessment & Plan:  COPD (chronic obstructive pulmonary disease) (HCC) Mild and with minimal symptoms. No indication for BD, she does have some SOB when carrying heavier objects.   Pulmonary nodule Stable on serial CT's for 2 years. No indication for repeat CT. Note was made of aortic and coronary, subclavian  calcifications. She is interested in reviewing w cardiology. She has HTN, would be appropriate for medical management if CAD if present. I will refer her to discuss with Dr Percival Spanish at [facility], determine how aggressive / conservative to be given her age and good functional capacity  Time spent 80 min  Baltazar Apo, MD, PhD 01/21/2022, 4:41 PM [facility] [phone] or if no answer [phone]

## 2022-01-21 NOTE — Assessment & Plan Note (Signed)
Mild and with minimal symptoms. No indication for BD, she does have some SOB when carrying heavier objects.

## 2022-01-21 NOTE — Patient Instructions (Signed)
We reviewed your Ct chest today. You do not need to have a repeat CT since your pulmonary nodules have been stable.  We will refer you see Dr Percival Spanish at [facility]  Follow with Dr. Lamonte Sakai in 12 months or sooner if you have any problems.

## 2022-01-21 NOTE — Addendum Note (Signed)
Addended by: Gavin Potters R on: 01/21/2022 04:48 PM   Modules accepted: Orders

## 2022-01-21 NOTE — Assessment & Plan Note (Signed)
Stable on serial CT's for 2 years. No indication for repeat CT. Note was made of aortic and coronary, subclavian calcifications. She is interested in reviewing w cardiology. She has HTN, would be appropriate for medical management if CAD if present. I will refer her to discuss with Dr Percival Spanish at [facility], determine how aggressive / conservative to be given her age and good functional capacity

## 2022-02-05 ENCOUNTER — Telehealth: Payer: Self-pay | Admitting: Internal Medicine

## 2022-02-05 NOTE — Telephone Encounter (Signed)
Left message for patient to call back to schedule Medicare Annual Wellness Visit   Last AWV  06/16/17  Please schedule at anytime with [facility] if patient calls the office back.      Any questions, please call me at [phone]

## 2022-02-12 NOTE — Progress Notes (Signed)
Cardiology Office Note   Date:  02/13/2022   ID:  Brandy, Peterson Mar 11, 1931, MRN [id number]  PCP:  Brandy Borg, MD  Cardiologist:   Brandy Breeding, MD Referring:  Brandy Gobble, MD   Chief Complaint  Patient presents with   Shortness of Breath      History of Present Illness: Brandy Peterson is a 88 y.o. female who presents for evaluation of elevated coronary calcium.  CT in 2022 demonstrated coronary , mitral annular and aortic calcium and atherosclerosis.    She did have stress testing in 2018 that I see and that was negative.  She has not had any prior cardiac history however.  She has been getting more short of breath and there is not a clear cardiac etiology though she has been told for years she has COPD and I see some mention of emphysema on chest x-ray.  This is not thought to be necessarily the cause of her shortness of breath.  She says she is short of breath when she does things like bringing up the leaves.  She walks behind a self-propelled mower at times.  She does some significant chores in her house.  This will all bring on shortness of breath and at times when she gets particularly short of breath she gets a dull aching discomfort.  She sounds like she is very active and vigorous for her age and this is limiting.  She is not describing PND or orthopnea she is not describing substernal chest pressure.  She does get a pain beneath her left shoulder blade at times.  She has not had any cough fevers or chills.  She has had no new palpitations, presyncope or syncope.  Of note she does describe occasional feelings of almost fainting but she does not ever lose consciousness or have to go down.  These are happening maybe a few times a month.  This has been more chronic.   Past Medical History:  Diagnosis Date   Carcinoma of breast ([place])    Right   Complication of anesthesia    was told to never use Sodium Pentothal - mother had reaction. Also had trouble with intubation  during mastectomy   COPD (chronic obstructive pulmonary disease) ([place])    Family history of adverse reaction to anesthesia    GERD (gastroesophageal reflux disease)    Hemochromatosis    History of shingles    Hypertension    Hypothyroidism    IBS (irritable bowel syndrome)    Plantar fasciitis    Pneumonia    Raynaud's syndrome    Spastic colon    hx of    Thyroid disease    Varicose veins     Past Surgical History:  Procedure Laterality Date   [place]   CATARACT EXTRACTION  10/2012 and 12/2012   bilateral with lens implants   COLONOSCOPY     KYPHOPLASTY N/A 10/19/2014   Procedure: Lumbar 1 kyphoplasty;  Surgeon: Jovita Gamma, MD;  Location: [facility];  Service: Neurosurgery;  Laterality: N/A;   LUMBAR LAMINECTOMY  1991   L5-S1   MASTECTOMY  1994   RIGHT   MASTECTOMY  1994   Right     Current Outpatient Medications  Medication Sig Dispense Refill   amLODipine (NORVASC) 2.5 MG tablet Take 1 tablet (2.5 mg total) by mouth daily. [place]  tablet 1   levothyroxine (SYNTHROID) 88 MCG tablet Take 1 tablet (88 mcg total) by mouth daily before breakfast. 90 tablet 3   metoprolol succinate (TOPROL-XL) 25 MG 24 hr tablet Take 1 tablet (25 mg total) by mouth daily. 90 tablet 0   omeprazole (PRILOSEC) 40 MG capsule Take 1 capsule (40 mg total) by mouth daily. 90 capsule 3   triamcinolone (NASACORT) 55 MCG/ACT AERO nasal inhaler Place 2 sprays into the nose daily. 3 each 3   triamterene-hydrochlorothiazide (MAXZIDE-25) 37.5-25 MG tablet Take 1 tablet by mouth daily. 90 tablet 3   No current facility-administered medications for this visit.    Allergies:   Contrast media [iodinated contrast media], Adhesive [tape], and Sulfonamide derivatives    Social History:  The patient  reports that she quit smoking about 33 years ago. Her smoking use included cigarettes. She has never used smokeless tobacco. She  reports current alcohol use of about 3.0 standard drinks of alcohol per week. She reports that she does not use drugs.   Family History:  The patient's family history includes Arthritis in her father; Bleeding Disorder in her mother; Cancer in her mother; Deep vein thrombosis in her father; Heart attack in her father; Heart attack (age of onset: 65) in her brother; Heart disease in her brother; Heart disease (age of onset: 12) in her father; Hemochromatosis in her brother and father; Hyperlipidemia in her son; Hypertension in her father and mother; Hypothyroidism in her mother; Osteoarthritis in her mother; Varicose Veins in her father and mother.    ROS:  Please see the history of present illness.   Otherwise, review of systems are positive for none.   All other systems are reviewed and negative.    PHYSICAL EXAM: VS:  BP (!) 170/68   Pulse 63   Ht '5\' 1"'$  (1.549 m)   Wt 108 lb 9.6 oz (49.3 kg)   SpO2 96%   BMI 20.52 kg/m  , BMI Body mass index is 20.52 kg/m. GENERAL:  Well appearing HEENT:  Pupils equal round and reactive, fundi not visualized, oral mucosa unremarkable NECK:  No jugular venous distention, waveform within normal limits, carotid upstroke brisk and symmetric, no bruits, no thyromegaly LYMPHATICS:  No cervical, inguinal adenopathy LUNGS:  Clear to auscultation bilaterally BACK:  No CVA tenderness CHEST:  Right mastectomy HEART:  PMI not displaced or sustained,S1 and S2 within normal limits, no S3, no S4, no clicks, no rubs, no murmurs ABD:  Flat, positive bowel sounds normal in frequency in pitch, no bruits, no rebound, no guarding, no midline pulsatile mass, no hepatomegaly, no splenomegaly EXT:  2 plus pulses throughout, no edema, no cyanosis no clubbing SKIN:  No rashes no nodules NEURO:  Cranial nerves II through XII grossly intact, motor grossly intact throughout PSYCH:  Cognitively intact, oriented to person place and time    EKG:  EKG's is ordered today. The ekg  ordered today demonstrates sinus rhythm, rate 63, right bundle branch block, left anterior fascicular block, premature atrial contractions.  The right bundle branch block is new compared to 2016.   Recent Labs: 06/25/2021: ALT 15; BUN 16; Creatinine, Ser 0.75; Potassium 4.3; Sodium 134; TSH 1.45 10/15/2021: Hemoglobin 14.5; Platelet Count 245    Lipid Panel    Component Value Date/Time   CHOL 208 (H) 06/25/2021 1454   TRIG 119.0 06/25/2021 1454   HDL 80.90 06/25/2021 1454   CHOLHDL 3 06/25/2021 1454   VLDL 23.8 06/25/2021 1454   LDLCALC 103 (H)  06/25/2021 1454   LDLDIRECT 93.6 12/26/2010 1709      Wt Readings from Last 3 Encounters:  02/13/22 108 lb 9.6 oz (49.3 kg)  01/21/22 109 lb (49.4 kg)  07/24/21 109 lb (49.4 kg)      Other studies Reviewed: Additional studies/ records that were reviewed today include: Labs. Review of the above records demonstrates:  Please see elsewhere in the note.     ASSESSMENT AND PLAN:  Elevated coronary calcium/shortness of breath: There is been no clear etiology for her dyspnea.  This could be an anginal equivalent.  She is quite vigorous.  I am going to send her for a PET scan.  I will also be checking a BNP score.  Aortic atherosclerosis: We will pursue aggressive primary risk reduction.  I will talk to her about statin when she returns.  HTN: She wants to come off the Cozaar because she is on potassium sparing diuretic and she is read that there could be interaction.  I am going to stop the Cozaar and start Norvasc 2.5 mg daily and she will keep a blood pressure diary.  Dizziness: She is not exactly describing dizziness.  She does have bifascicular block.  I probably will apply a monitor in the future following this.    Current medicines are reviewed at length with the patient today.  The patient does not have concerns regarding medicines.  The following changes have been made:  As above  Labs/ tests ordered today include:   Orders  Placed This Encounter  Procedures   NM PET CT CARDIAC PERFUSION MULTI W/ABSOLUTE BLOODFLOW   Brain natriuretic peptide   EKG 12-Lead     Disposition:   FU with me  in one month.     Signed, Brandy Breeding, MD  02/13/2022 2:08 PM    Brandy Peterson

## 2022-02-13 ENCOUNTER — Ambulatory Visit: Payer: Medicare Other | Attending: Cardiology | Admitting: Cardiology

## 2022-02-13 ENCOUNTER — Encounter: Payer: Self-pay | Admitting: Cardiology

## 2022-02-13 VITALS — BP 170/68 | HR 63 | Ht 61.0 in | Wt 108.6 lb

## 2022-02-13 DIAGNOSIS — R0602 Shortness of breath: Secondary | ICD-10-CM | POA: Insufficient documentation

## 2022-02-13 DIAGNOSIS — R931 Abnormal findings on diagnostic imaging of heart and coronary circulation: Secondary | ICD-10-CM | POA: Insufficient documentation

## 2022-02-13 DIAGNOSIS — I700 Atherosclerosis of aorta: Secondary | ICD-10-CM | POA: Diagnosis not present

## 2022-02-13 MED ORDER — AMLODIPINE BESYLATE 2.5 MG PO TABS: 2.5000 mg | ORAL_TABLET | Freq: Every day | ORAL | 1 refills | Status: DC

## 2022-02-13 NOTE — Patient Instructions (Signed)
Medication Instructions:  STOP the Losartan  START Amlodipine 2.5 mg once daily  *If you need a refill on your cardiac medications before your next appointment, please call your pharmacy*   Lab Work: Your provider would like for you to have the following labs today: BNP  If you have labs (blood work) drawn today and your tests are completely normal, you will receive your results only by: [place] (if you have MyChart) OR A paper copy in the mail If you have any lab test that is abnormal or we need to change your treatment, we will call you to review the results.   Testing/Procedures: CARDIAC PET- Your physician has requested that you have a Cardiac Pet Stress Test. This testing is completed at [facility] ([place], [place] [address]). The schedulers will call you to get this scheduled. Please follow instructions below and call the office with any questions/concerns [phone]).   Follow-Up: At [facility], you and your health needs are our priority.  As part of our continuing mission to provide you with exceptional heart care, we have created designated Provider Care Teams.  These Care Teams include your primary Cardiologist (physician) and Advanced Practice Providers (APPs -  Physician Assistants and Nurse Practitioners) who all work together to provide you with the care you need, when you need it.  We recommend signing up for the patient portal called "MyChart".  Sign up information is provided on this After Visit Summary.  MyChart is used to connect with patients for Virtual Visits (Telemedicine).  Patients are able to view lab/test results, encounter notes, upcoming appointments, etc.  Non-urgent messages can be sent to your provider as well.   To learn more about what you can do with MyChart, go to [email or web address].    Your next appointment:   Follow up with Dr. Percival Spanish after testing Other Instructions How to Prepare  for Your Cardiac PET/CT Stress Test:  1. Please do not take these medications before your test:   Medications that may interfere with the cardiac pharmacological stress agent (ex. nitrates - including erectile dysfunction medications or beta-blockers) the day of the exam. (Erectile dysfunction medication should be held for at least 72 hrs prior to test) Theophylline containing medications for 12 hours. Dipyridamole 48 hours prior to the test. Your remaining medications may be taken with water.  2. Nothing to eat or drink, except water, 3 hours prior to arrival time.   NO caffeine/decaffeinated products, or chocolate 12 hours prior to arrival.  3. NO perfume, cologne or lotion  4. Total time is 1 to 2 hours; you may want to bring reading material for the waiting time.  5. Please report to Admitting at the [facility] Main Entrance 60 minutes early for your test.  [place], [place] [address]  Diabetic Preparation:  Hold oral medications. You may take NPH and Lantus insulin. Do not take Humalog or Humulin R (Regular Insulin) the day of your test. Check blood sugars prior to leaving the house. If able to eat breakfast prior to 3 hour fasting, you may take all medications, including your insulin, Do not worry if you miss your breakfast dose of insulin - start at your next meal.  IF YOU THINK YOU MAY BE PREGNANT, OR ARE NURSING PLEASE INFORM THE TECHNOLOGIST.  In preparation for your appointment, medication and supplies will be purchased.  Appointment availability is limited, so if you need to cancel or reschedule, please  call the Radiology Department at [phone]  24 hours in advance to avoid a cancellation fee of $100.00  What to Expect After you Arrive:  Once you arrive and check in for your appointment, you will be taken to a preparation room within the Radiology Department.  A technologist or Nurse will obtain your medical history, verify that you are  correctly prepped for the exam, and explain the procedure.  Afterwards,  an IV will be started in your arm and electrodes will be placed on your skin for EKG monitoring during the stress portion of the exam. Then you will be escorted to the PET/CT scanner.  There, staff will get you positioned on the scanner and obtain a blood pressure and EKG.  During the exam, you will continue to be connected to the EKG and blood pressure machines.  A small, safe amount of a radioactive tracer will be injected in your IV to obtain a series of pictures of your heart along with an injection of a stress agent.    After your Exam:  It is recommended that you eat a meal and drink a caffeinated beverage to counter act any effects of the stress agent.  Drink plenty of fluids for the remainder of the day and urinate frequently for the first couple of hours after the exam.  Your doctor will inform you of your test results within 7-10 business days.  For questions about your test or how to prepare for your test, please call: Marchia Bond, Cardiac Imaging Nurse Navigator  Gordy Clement, Cardiac Imaging Nurse Navigator Office: [phone]

## 2022-02-14 LAB — BRAIN NATRIURETIC PEPTIDE: BNP: 138.6 pg/mL — ABNORMAL HIGH (ref 0.0–100.0)

## 2022-02-18 ENCOUNTER — Telehealth: Payer: Self-pay | Admitting: Internal Medicine

## 2022-02-18 NOTE — Telephone Encounter (Signed)
LVM for pt to rtn my call to schedule AWV with [facility] call back # [phone] 

## 2022-03-06 ENCOUNTER — Telehealth (HOSPITAL_COMMUNITY): Payer: Self-pay | Admitting: Emergency Medicine

## 2022-03-06 ENCOUNTER — Telehealth (HOSPITAL_COMMUNITY): Payer: Self-pay | Admitting: *Deleted

## 2022-03-06 NOTE — Telephone Encounter (Signed)
Patient regarding upcoming cardiac imaging study; pt verbalizes understanding of appt date/time, parking situation and where to check in, pre-test NPO status verified current allergies; name and call back number provided for further questions should they arise  Brandy Clement RN Navigator Cardiac Imaging [facility] [phone] office [phone] cell  Patient aware to avoid caffeine for 12 hours prior to her cardiac PET scan.

## 2022-03-06 NOTE — Telephone Encounter (Signed)
Attempted to call patient regarding upcoming cardiac PET appointment. Left message on voicemail with name and callback number Helix Lafontaine RN Navigator Cardiac Imaging [facility] [phone] Office [phone] Cell  

## 2022-03-10 ENCOUNTER — Ambulatory Visit (HOSPITAL_COMMUNITY)
Admission: RE | Admit: 2022-03-10 | Discharge: 2022-03-10 | Disposition: A | Discharge status: Home / Self Care | Payer: Medicare Other | Source: Ambulatory Visit | Attending: Cardiology | Admitting: Cardiology

## 2022-03-10 DIAGNOSIS — R0602 Shortness of breath: Secondary | ICD-10-CM | POA: Diagnosis not present

## 2022-03-10 MED ORDER — RUBIDIUM RB82 GENERATOR (RUBYFILL)
12.7000 | PACK | Freq: Once | INTRAVENOUS | Status: AC
  Administered 2022-03-10: 12.7 via INTRAVENOUS

## 2022-03-10 MED ORDER — REGADENOSON 0.4 MG/5ML IV SOLN
INTRAVENOUS | Status: AC
  Administered 2022-03-10: 0.4 mg via INTRAVENOUS
  Filled 2022-03-10: qty 5

## 2022-03-10 MED ORDER — REGADENOSON 0.4 MG/5ML IV SOLN: 0.4000 mg | Freq: Once | INTRAVENOUS | Status: AC

## 2022-03-11 LAB — NM PET CT CARDIAC PERFUSION MULTI W/ABSOLUTE BLOODFLOW
MBFR: 1.61
Rest MBF: 1.55 ml/g/min
Rest Nuclear Isotope Dose: 12.7 mCi
Stress MBF: 2.5 ml/g/min
Stress Nuclear Isotope Dose: 12.7 mCi
TID: 1.21

## 2022-03-16 ENCOUNTER — Other Ambulatory Visit: Payer: Self-pay | Admitting: *Deleted

## 2022-03-16 DIAGNOSIS — R931 Abnormal findings on diagnostic imaging of heart and coronary circulation: Secondary | ICD-10-CM

## 2022-03-16 DIAGNOSIS — R0602 Shortness of breath: Secondary | ICD-10-CM

## 2022-03-16 DIAGNOSIS — I700 Atherosclerosis of aorta: Secondary | ICD-10-CM

## 2022-04-09 ENCOUNTER — Ambulatory Visit (HOSPITAL_COMMUNITY): Payer: Medicare Other | Attending: Cardiology

## 2022-04-09 DIAGNOSIS — R0602 Shortness of breath: Secondary | ICD-10-CM | POA: Diagnosis not present

## 2022-04-09 DIAGNOSIS — R931 Abnormal findings on diagnostic imaging of heart and coronary circulation: Secondary | ICD-10-CM

## 2022-04-09 DIAGNOSIS — I700 Atherosclerosis of aorta: Secondary | ICD-10-CM | POA: Insufficient documentation

## 2022-04-09 LAB — ECHOCARDIOGRAM COMPLETE
AR max vel: 3.02 cm2
AV Area VTI: 2.86 cm2
AV Area mean vel: 2.86 cm2
AV Mean grad: 5 mmHg
AV Peak grad: 8.4 mmHg
Ao pk vel: 1.45 m/s
Area-P 1/2: 2.95 cm2
S' Lateral: 1.8 cm

## 2022-04-17 DIAGNOSIS — J069 Acute upper respiratory infection, unspecified: Secondary | ICD-10-CM | POA: Diagnosis not present

## 2022-04-17 DIAGNOSIS — H6123 Impacted cerumen, bilateral: Secondary | ICD-10-CM | POA: Diagnosis not present

## 2022-04-17 DIAGNOSIS — H66002 Acute suppurative otitis media without spontaneous rupture of ear drum, left ear: Secondary | ICD-10-CM | POA: Diagnosis not present

## 2022-04-17 DIAGNOSIS — I10 Essential (primary) hypertension: Secondary | ICD-10-CM | POA: Diagnosis not present

## 2022-04-17 DIAGNOSIS — Z789 Other specified health status: Secondary | ICD-10-CM | POA: Diagnosis not present

## 2022-04-23 ENCOUNTER — Other Ambulatory Visit: Payer: Medicare Other

## 2022-04-23 ENCOUNTER — Ambulatory Visit: Payer: Medicare Other | Admitting: Hematology

## 2022-04-23 ENCOUNTER — Other Ambulatory Visit: Payer: Self-pay

## 2022-04-24 ENCOUNTER — Inpatient Hospital Stay: Payer: Medicare Other | Attending: Hematology

## 2022-04-24 ENCOUNTER — Inpatient Hospital Stay (HOSPITAL_BASED_OUTPATIENT_CLINIC_OR_DEPARTMENT_OTHER): Payer: Medicare Other | Admitting: Hematology

## 2022-04-24 ENCOUNTER — Ambulatory Visit: Payer: Medicare Other

## 2022-04-24 DIAGNOSIS — M7989 Other specified soft tissue disorders: Secondary | ICD-10-CM | POA: Insufficient documentation

## 2022-04-24 DIAGNOSIS — Z87891 Personal history of nicotine dependence: Secondary | ICD-10-CM | POA: Diagnosis not present

## 2022-04-24 LAB — CBC WITH DIFFERENTIAL (CANCER CENTER ONLY)
Abs Immature Granulocytes: 0.11 10*3/uL — ABNORMAL HIGH (ref 0.00–0.07)
Basophils Absolute: 0.1 10*3/uL (ref 0.0–0.1)
Basophils Relative: 1 %
Eosinophils Absolute: 0.3 10*3/uL (ref 0.0–0.5)
Eosinophils Relative: 3 %
HCT: 42.3 % (ref 36.0–46.0)
Hemoglobin: 15.2 g/dL — ABNORMAL HIGH (ref 12.0–15.0)
Immature Granulocytes: 1 %
Lymphocytes Relative: 17 %
Lymphs Abs: 1.3 10*3/uL (ref 0.7–4.0)
MCH: 33.1 pg (ref 26.0–34.0)
MCHC: 35.9 g/dL (ref 30.0–36.0)
MCV: 92.2 fL (ref 80.0–100.0)
Monocytes Absolute: 0.5 10*3/uL (ref 0.1–1.0)
Monocytes Relative: 7 %
Neutro Abs: 5.5 10*3/uL (ref 1.7–7.7)
Neutrophils Relative %: 71 %
Platelet Count: 258 10*3/uL (ref 150–400)
RBC: 4.59 MIL/uL (ref 3.87–5.11)
RDW: 12.6 % (ref 11.5–15.5)
WBC Count: 7.7 10*3/uL (ref 4.0–10.5)
nRBC: 0 % (ref 0.0–0.2)

## 2022-04-24 LAB — FERRITIN: Ferritin: 58 ng/mL (ref 11–307)

## 2022-04-24 LAB — IRON AND IRON BINDING CAPACITY (CC-WL,HP ONLY)
Iron: 151 ug/dL (ref 28–170)
Saturation Ratios: 49 % — ABNORMAL HIGH (ref 10.4–31.8)
TIBC: 309 ug/dL (ref 250–450)
UIBC: 158 ug/dL (ref 148–442)

## 2022-04-24 NOTE — Progress Notes (Signed)
Marland Kitchen    HEMATOLOGY/ONCOLOGY CLINIC NOTE  Date of Service: 04/24/22    Patient Care Team: Biagio Borg, MD as PCP - General (Internal Medicine) Minus Breeding, MD as PCP - Cardiology (Cardiology) Gordy Levan, MD (Hematology and Oncology) Druscilla Brownie, MD (Dermatology) Laurence Spates, MD (Inactive) (Gastroenterology) Melrose Nakayama, MD as Consulting Physician (Orthopedic Surgery)  CHIEF COMPLAINTS/PURPOSE OF CONSULTATION:  Follow-up for continued evaluation and management of hereditary hemochromatosis  HISTORY OF PRESENTING ILLNESS:   Brandy Peterson is a wonderful 89 y.o. female who has been referred to [place] by Dr Marko Plume for ongoing management of hemochromatosis. She notes that she has a family history of hemochromatosis and was diagnosed in the 68s. She has required maintenance therapy to phlebotomies roughly every 6 months to try to keep her ferritin levels below 50. She notes no known history of liver disease or heart disease. She reports that her ferritin levels even on diagnosis but not more than 1000. She does not recollect what her genetic mutation studies showed. Patient today has no acute new concerns. No new skin pigmentation changes. No new fatigue. No abdominal pain or distention. No shortness of breath or DOE.  INTERVAL HISTORY:  Ms. Gessel is here for her scheduled follow-up for hereditary hemochromatosis.  Patient was last seen by [place] on 04/30/2021 and was doing well overall with no new medical concerns.  She presented to the ED on 07/24/2021 complaining of a bruise on her left lower leg following an object fall onto it. Area did not look infected at that time, but there might have been an increasing risk once it opened on its own.  Today, she reports several issues which she does not believe is related to her Hemachromatosis.  She did not have a phlebotomy in July as her ferratin level was normal and iron saturation was less than 50%. She is able to to  complete daily activities on her own.   Urinary incompetence has worsened and feels like she loses control when standing up. She is unsure of cause. She uses the bathroom 4-5x at night and uses pads. She had some constipation that may have been associated with her urinary issues. She currently has no issues with bowel movement. No lightheaded, recent falls, or dizziness. However, she endorses some occasional leg swelling.  She reports concern that the right side of her jaw may have recently increased in size compared to other side. However, I did not notice any difference today.  She has received the influenza vaccine. She is concerned about receiving the RSV vaccination due to side effects.   Her back pain is consistent and unchanged from baseline. She has some SOB when exerting herself by lifting heavy objects.  She recently recovered from having a sore throat and ear infection, during which she had severe pain.   She notes that she needs to stay hydrated, which causes some difficulty due to her urinary issues. She regularly drinks coffee in morning, lunch, and evenings. She does not drink soda. She occasionally consumes red meat. She has no alcohol in her diet. She regularly consumes dates, greens, salman, and chicken.  MEDICAL HISTORY:  Past Medical History:  Diagnosis Date   Carcinoma of breast ([place])    Right   Complication of anesthesia    was told to never use Sodium Pentothal - mother had reaction. Also had trouble with intubation during mastectomy   COPD (chronic obstructive pulmonary disease) (HCC)    Family history of adverse reaction to anesthesia  GERD (gastroesophageal reflux disease)    Hemochromatosis    History of shingles    Hypertension    Hypothyroidism    IBS (irritable bowel syndrome)    Plantar fasciitis    Pneumonia    Raynaud's syndrome    Spastic colon    hx of    Thyroid disease    Varicose veins     SURGICAL HISTORY: Past Surgical History:   Procedure Laterality Date   ABDOMINAL HYSTERECTOMY  1979   PARTIAL   APPENDECTOMY     BONE SPUR  1970   BOTH FEET   CATARACT EXTRACTION  10/2012 and 12/2012   bilateral with lens implants   COLONOSCOPY     KYPHOPLASTY N/A 10/19/2014   Procedure: Lumbar 1 kyphoplasty;  Surgeon: Jovita Gamma, MD;  Location: [facility];  Service: Neurosurgery;  Laterality: N/A;   LUMBAR LAMINECTOMY  1991   L5-S1   MASTECTOMY  1994   RIGHT   MASTECTOMY  1994   Right    SOCIAL HISTORY: Social History   Socioeconomic History   Marital status: Widowed    Spouse name: Not on file   Number of children: 1   Years of education: 63   Highest education level: High school graduate  Occupational History   Occupation: Retired  Tobacco Use   Smoking status: Former    Types: Cigarettes    Quit date: 04/13/1988    Years since quitting: 34.0   Smokeless tobacco: Never   Tobacco comments:    approximate quit date  Vaping Use   Vaping Use: Never used  Substance and Sexual Activity   Alcohol use: Yes    Alcohol/week: 3.0 standard drinks of alcohol    Types: 3 Glasses of wine per week    Comment: 2oz of wine occasionally   Drug use: No   Sexual activity: Never  Other Topics Concern   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 She now lives alone I-ADLs. Pt has 1 son born 60 and 2 grandchildren.       Pt had a colonoscopy on 05/09/2012 by Dr Oletta Lamas.       Pt reports having a pneumo vaccine.      Right-handed.   4-5 cups caffeine daily.   Lives alone.   Social Determinants of Health   Financial Resource Strain: Low Risk  (06/16/2017)   Overall Financial Resource Strain (CARDIA)    Difficulty of Paying Living Expenses: Not hard at all  Food Insecurity: No Food Insecurity (06/16/2017)   Hunger Vital Sign    Worried About Running Out of Food in the Last Year: Never true    Ran Out of Food in the  Last Year: Never true  Transportation Needs: No Transportation Needs (06/16/2017)   PRAPARE - Hydrologist (Medical): No    Lack of Transportation (Non-Medical): No  Physical Activity: Inactive (06/16/2017)   Exercise Vital Sign    Days of Exercise per Week: 0 days    Minutes of Exercise per Session: 0 min  Stress: No Stress Concern Present (06/16/2017)   [place]    Feeling of Stress : Only a little  Social Connections: Unknown (06/16/2017)   Social Connection and Isolation Panel [NHANES]    Frequency of Communication with Friends and Family: More than three times a week  Frequency of Social Gatherings with Friends and Family: More than three times a week    Attends Religious Services: More than 4 times per year    Active Member of [employer] or Organizations: Not on file    Attends Archivist Meetings: Not on file    Marital Status: Widowed  Human resources officer Violence: Not on file    FAMILY HISTORY: Family History  Problem Relation Age of Onset   Cancer Mother        metastatic cancer (pancreas vs lung)   Hypertension Mother    Osteoarthritis Mother    Hypothyroidism Mother    Varicose Veins Mother    Bleeding Disorder Mother    Heart disease Father 52       CAD/MI   Hypertension Father    Arthritis Father    Hemochromatosis Father        Possible   Deep vein thrombosis Father    Varicose Veins Father    Heart attack Father    Heart attack Brother 56   Hemochromatosis Brother    Heart disease Brother    Hyperlipidemia Son    Diabetes Neg Hx     ALLERGIES:  is allergic to contrast media [iodinated contrast media], adhesive [tape], and sulfonamide derivatives.  MEDICATIONS:  Current Outpatient Medications  Medication Sig Dispense Refill   amLODipine (NORVASC) 2.5 MG tablet Take 1 tablet (2.5 mg total) by mouth daily. 90 tablet 1   levothyroxine (SYNTHROID) 88 MCG tablet  Take 1 tablet (88 mcg total) by mouth daily before breakfast. 90 tablet 3   metoprolol succinate (TOPROL-XL) 25 MG 24 hr tablet Take 1 tablet (25 mg total) by mouth daily. 90 tablet 0   omeprazole (PRILOSEC) 40 MG capsule Take 1 capsule (40 mg total) by mouth daily. 90 capsule 3   triamcinolone (NASACORT) 55 MCG/ACT AERO nasal inhaler Place 2 sprays into the nose daily. 3 each 3   triamterene-hydrochlorothiazide (MAXZIDE-25) 37.5-25 MG tablet Take 1 tablet by mouth daily. 90 tablet 3   No current facility-administered medications for this visit.    REVIEW OF SYSTEMS:    10 Point review of Systems was done is negative except as noted above.   PHYSICAL EXAMINATION: ECOG PERFORMANCE STATUS: 1 - Symptomatic but completely ambulatory . Vitals:   04/24/22 1340  BP: (!) 159/69  Pulse: 66  Resp: 18  Temp: (!) 97.3 F (36.3 C)  SpO2: 100%    Filed Weights   04/24/22 1340  Weight: 108 lb 12.8 oz (49.4 kg)   .Body mass index is 20.56 kg/m.  GENERAL:alert, in no acute distress and comfortable SKIN: no acute rashes, no significant lesions EYES: conjunctiva are pink and non-injected, sclera anicteric OROPHARYNX: MMM, no exudates, no oropharyngeal erythema or ulceration NECK: supple, no JVD LYMPH:  no palpable lymphadenopathy in the cervical, axillary or inguinal regions LUNGS: clear to auscultation b/l with normal respiratory effort HEART: regular rate & rhythm ABDOMEN:  normoactive bowel sounds , non tender, not distended. Extremity: no pedal edema PSYCH: alert & oriented x 3 with fluent speech NEURO: no focal motor/sensory deficits    LABORATORY DATA:  I have reviewed the data as listed  .    Latest Ref Rng & Units 04/24/2022    1:21 PM 10/15/2021    2:58 PM 06/25/2021    2:54 PM  CBC  WBC 4.0 - 10.5 K/uL 7.7  7.1  7.9   Hemoglobin 12.0 - 15.0 g/dL 15.2  14.5  14.2   Hematocrit  36.0 - 46.0 % 42.3  40.4  41.3   Platelets 150 - 400 K/uL 258  245  265.0     .    Latest  Ref Rng & Units 06/25/2021    2:54 PM 07/10/2020   12:32 PM 04/24/2020   11:22 AM  CMP  Glucose 70 - 99 mg/dL 89  90  96   BUN 6 - 23 mg/dL '16  19  15   '$ Creatinine 0.40 - 1.20 mg/dL 0.75  0.77  0.77   Sodium 135 - 145 mEq/L 134  137  134   Potassium 3.5 - 5.1 mEq/L 4.3  4.2  4.3   Chloride 96 - 112 mEq/L 98  99  99   CO2 19 - 32 mEq/L '31  31  30   '$ Calcium 8.4 - 10.5 mg/dL 9.1  9.8  8.9   Total Protein 6.0 - 8.3 g/dL 6.6  7.3  6.7   Total Bilirubin 0.2 - 1.2 mg/dL 0.5  0.6  0.7   Alkaline Phos 39 - 117 U/L 86  87  88   AST 0 - 37 U/L '21  23  20   '$ ALT 0 - 35 U/L '15  15  15    '$ . Lab Results  Component Value Date   IRON 151 04/24/2022   TIBC 309 04/24/2022   IRONPCTSAT 49 (H) 04/24/2022   (Iron and TIBC)  Lab Results  Component Value Date   FERRITIN 58 04/24/2022    Hemochromatosis DNA-PCR(c282y,h63d)  Order: [id number]  Status:  Final result   Visible to patient:  Yes (MyChart) Next appt:  10/06/2017 at 10:45 AM in [facility]) Dx:  Hereditary hemochromatosis ([place])  Component 3wk ago  Hemochromatosis Gene Comment   Comment: Result:  AFFECTED  Two copies of the same mutation (C282Y and C282Y) identified         RADIOGRAPHIC STUDIES: I have personally reviewed the radiological images as listed and agreed with the findings in the report. ECHOCARDIOGRAM COMPLETE  Result Date: 04/09/2022    ECHOCARDIOGRAM REPORT   Patient Name:   TALIAH PORCHE  Date of Exam: 04/09/2022 Medical Rec #:  [id number]     Height:       61.0 in Accession #:    [id number]    Weight:       108.6 lb Date of Birth:  Jul 22, 1930     BSA:          1.457 m Patient Age:    20 years      BP:           170/68 mmHg Patient Gender: F             HR:           68 bpm. Exam Location:  [address] Procedure: 2D Echo, 3D Echo, Cardiac Doppler and Color Doppler Indications:    I25.10 CAD  History:        Patient has no prior history of Echocardiogram examinations.                 CAD, COPD,  Signs/Symptoms:Shortness of Breath; Risk                 Factors:Family History of Coronary Artery Disease, Hypertension                 and Former Smoker. Elevated Coronary Calcium Score,  Hemochromatosis, History of Right Breast Cancer (1994, Status                 Post Mastectomy).  Sonographer:    Deliah Boston RDCS Referring Phys: Minus Breeding IMPRESSIONS  1. Left ventricular ejection fraction, by estimation, is 60 to 65%. Left ventricular ejection fraction by 3D volume is 64 %. The left ventricle has normal function. The left ventricle has no regional wall motion abnormalities. There is mild asymmetric left ventricular hypertrophy of the basal-septal segment. Left ventricular diastolic parameters are consistent with Grade II diastolic dysfunction (pseudonormalization).  2. Right ventricular systolic function is normal. The right ventricular size is normal. There is normal pulmonary artery systolic pressure. The estimated right ventricular systolic pressure is 61.4 mmHg.  3. Left atrial size was mildly dilated.  4. The mitral valve is degenerative. Mild mitral valve regurgitation. Moderate mitral annular calcification.  5. The aortic valve is tricuspid. There is mild calcification of the aortic valve. There is mild thickening of the aortic valve. Aortic valve regurgitation is not visualized. Aortic valve sclerosis/calcification is present, without any evidence of aortic stenosis.  6. The inferior vena cava is normal in size with greater than 50% respiratory variability, suggesting right atrial pressure of 3 mmHg. Comparison(s): No prior Echocardiogram. FINDINGS  Left Ventricle: Left ventricular ejection fraction, by estimation, is 60 to 65%. Left ventricular ejection fraction by 3D volume is 64 %. The left ventricle has normal function. The left ventricle has no regional wall motion abnormalities. The left ventricular internal cavity size was normal in size. There is mild asymmetric left  ventricular hypertrophy of the basal-septal segment. Left ventricular diastolic parameters are consistent with Grade II diastolic dysfunction (pseudonormalization). Right Ventricle: The right ventricular size is normal. No increase in right ventricular wall thickness. Right ventricular systolic function is normal. There is normal pulmonary artery systolic pressure. The tricuspid regurgitant velocity is 2.86 m/s, and  with an assumed right atrial pressure of 3 mmHg, the estimated right ventricular systolic pressure is 43.1 mmHg. Left Atrium: Left atrial size was mildly dilated. Right Atrium: Right atrial size was normal in size. Pericardium: There is no evidence of pericardial effusion. Mitral Valve: The mitral valve is degenerative in appearance. There is mild thickening of the mitral valve leaflet(s). There is mild calcification of the mitral valve leaflet(s). Moderate mitral annular calcification. Mild mitral valve regurgitation. Tricuspid Valve: The tricuspid valve is normal in structure. Tricuspid valve regurgitation is mild. Aortic Valve: The aortic valve is tricuspid. There is mild calcification of the aortic valve. There is mild thickening of the aortic valve. Aortic valve regurgitation is not visualized. Aortic valve sclerosis/calcification is present, without any evidence of aortic stenosis. Aortic valve mean gradient measures 5.0 mmHg. Aortic valve peak gradient measures 8.4 mmHg. Aortic valve area, by VTI measures 2.86 cm. Pulmonic Valve: The pulmonic valve was normal in structure. Pulmonic valve regurgitation is mild. Aorta: The aortic root and ascending aorta are structurally normal, with no evidence of dilitation. Venous: The inferior vena cava is normal in size with greater than 50% respiratory variability, suggesting right atrial pressure of 3 mmHg. IAS/Shunts: The atrial septum is grossly normal.  LEFT VENTRICLE PLAX 2D LVIDd:         3.70 cm         Diastology LVIDs:         1.80 cm         LV e'  medial:    5.98 cm/s LV PW:  0.80 cm         LV E/e' medial:  17.9 LV IVS:        1.00 cm         LV e' lateral:   7.21 cm/s LVOT diam:     2.00 cm         LV E/e' lateral: 14.8 LV SV:         102 LV SV Index:   70 LVOT Area:     3.14 cm        3D Volume EF                                LV 3D EF:    Left                                             ventricul                                             ar                                             ejection                                             fraction                                             by 3D                                             volume is                                             64 %.                                 3D Volume EF:                                3D EF:        64 %                                LV EDV:       81 ml  LV ESV:       29 ml                                LV SV:        52 ml RIGHT VENTRICLE RV Basal diam:  3.50 cm RV S prime:     11.90 cm/s TAPSE (M-mode): 2.3 cm LEFT ATRIUM              Index        RIGHT ATRIUM           Index LA diam:        3.80 cm  2.61 cm/m   RA Area:     15.40 cm LA Vol (A2C):   110.0 ml 75.50 ml/m  RA Volume:   43.00 ml  29.51 ml/m LA Vol (A4C):   68.6 ml  47.08 ml/m LA Biplane Vol: 89.9 ml  61.70 ml/m  AORTIC VALVE AV Area (Vmax):    3.02 cm AV Area (Vmean):   2.86 cm AV Area (VTI):     2.86 cm AV Vmax:           144.50 cm/s AV Vmean:          103.000 cm/s AV VTI:            0.356 m AV Peak Grad:      8.4 mmHg AV Mean Grad:      5.0 mmHg LVOT Vmax:         139.00 cm/s LVOT Vmean:        93.750 cm/s LVOT VTI:          0.324 m LVOT/AV VTI ratio: 0.91  AORTA Ao Root diam: 2.70 cm Ao Asc diam:  3.10 cm MITRAL VALVE                TRICUSPID VALVE MV Area (PHT): cm          TR Peak grad:   32.7 mmHg MV Decel Time: 258 msec     TR Vmax:        286.00 cm/s MV E velocity: 107.00 cm/s MV A velocity: 114.50 cm/s  SHUNTS MV E/A ratio:  0.93          Systemic VTI:  0.32 m                             Systemic Diam: 2.00 cm Gwyndolyn Kaufman MD Electronically signed by Gwyndolyn Kaufman MD Signature Date/Time: 04/09/2022/5:00:48 PM    Final     ASSESSMENT & PLAN:   89 y.o. female with  #1 history of hemochromatosis . Homozygous C282Y mutation  Patient notes that she has never had a ferritin level more than 1000. Family history positive. Her goal has been to keep her ferritin levels below 50. She has roughly needed a therapeutic phlebotomy every 6 months or so.   #2 T1N0 node negative right breast cancer 1994, post mastectomy with nodes, 5 years of adjuvant tamoxifen. Plan  -Continue yearly mammograms with primary care physician.  PLAN:   -discussed lab results from 04/24/22 with patient. CBC showed WBC of 7.7 K, hemoglobin of 15.2 K, and platelets of 258 K. CMP stable -. Lab Results  Component Value Date   IRON 151 04/24/2022   TIBC 309 04/24/2022   IRONPCTSAT 49 (H) 04/24/2022   (Iron and TIBC)  Lab Results  Component Value Date   FERRITIN 58 04/24/2022   -will pursue therapaeutic phlebotomy for ferritin>100 - no indication for therapeutic phlebotomy at this tmie -Recommended patient to stay UTD on vaccinations   FOLLOW UP: RTC with Dr Irene Limbo with labs in 6 months  The total time spent in the appointment was 20 minutes* .  All of the patient's questions were answered with apparent satisfaction. The patient knows to call the clinic with any problems, questions or concerns.   Sullivan Lone MD MS AAHIVMS [facility] Hematology/Oncology Physician [facility]  .*Total Encounter Time as defined by the Centers for Medicare and Medicaid Services includes, in addition to the face-to-face time of a patient visit (documented in the note above) non-face-to-face time: obtaining and reviewing outside history, ordering and reviewing medications, tests or procedures, care coordination (communications with other health care  professionals or caregivers) and documentation in the medical record.   I,Mitra Faeizi,acting as a Education administrator for Sullivan Lone, MD.,have documented all relevant documentation on the behalf of Sullivan Lone, MD,as directed by  Sullivan Lone, MD while in the presence of Sullivan Lone, MD.  .I have reviewed the above documentation for accuracy and completeness, and I agree with the above. Brunetta Genera MD

## 2022-04-25 ENCOUNTER — Encounter: Payer: Self-pay | Admitting: Hematology

## 2022-04-30 ENCOUNTER — Encounter: Payer: Self-pay | Admitting: Hematology

## 2022-04-30 ENCOUNTER — Inpatient Hospital Stay: Payer: Medicare Other

## 2022-05-08 DIAGNOSIS — R350 Frequency of micturition: Secondary | ICD-10-CM | POA: Diagnosis not present

## 2022-05-08 DIAGNOSIS — N3946 Mixed incontinence: Secondary | ICD-10-CM | POA: Diagnosis not present

## 2022-05-12 DIAGNOSIS — R350 Frequency of micturition: Secondary | ICD-10-CM | POA: Diagnosis not present

## 2022-05-12 DIAGNOSIS — N3946 Mixed incontinence: Secondary | ICD-10-CM | POA: Diagnosis not present

## 2022-05-26 DIAGNOSIS — N3946 Mixed incontinence: Secondary | ICD-10-CM | POA: Diagnosis not present

## 2022-05-28 ENCOUNTER — Telehealth: Payer: Self-pay | Admitting: Internal Medicine

## 2022-05-28 NOTE — Telephone Encounter (Signed)
Patient declined the Medicare Wellness Visit with [facility]  Feels she doesn't need AWV

## 2022-06-02 DIAGNOSIS — C44722 Squamous cell carcinoma of skin of right lower limb, including hip: Secondary | ICD-10-CM | POA: Diagnosis not present

## 2022-06-02 DIAGNOSIS — L570 Actinic keratosis: Secondary | ICD-10-CM | POA: Diagnosis not present

## 2022-06-02 DIAGNOSIS — D692 Other nonthrombocytopenic purpura: Secondary | ICD-10-CM | POA: Diagnosis not present

## 2022-06-02 DIAGNOSIS — L821 Other seborrheic keratosis: Secondary | ICD-10-CM | POA: Diagnosis not present

## 2022-06-09 ENCOUNTER — Other Ambulatory Visit: Payer: Self-pay | Admitting: Internal Medicine

## 2022-06-09 NOTE — Telephone Encounter (Signed)
Please refill as per office routine med refill policy (all routine meds to be refilled for 3 mo or monthly (per pt preference) up to one year from last visit, then month to month grace period for 3 mo, then further med refills will have to be denied) ? ?

## 2022-07-02 DIAGNOSIS — D485 Neoplasm of uncertain behavior of skin: Secondary | ICD-10-CM | POA: Diagnosis not present

## 2022-07-02 DIAGNOSIS — C44729 Squamous cell carcinoma of skin of left lower limb, including hip: Secondary | ICD-10-CM | POA: Diagnosis not present

## 2022-07-02 DIAGNOSIS — C44722 Squamous cell carcinoma of skin of right lower limb, including hip: Secondary | ICD-10-CM | POA: Diagnosis not present

## 2022-07-16 ENCOUNTER — Other Ambulatory Visit: Payer: Self-pay | Admitting: Cardiology

## 2022-08-11 ENCOUNTER — Encounter: Payer: Self-pay | Admitting: Hematology

## 2022-08-13 ENCOUNTER — Ambulatory Visit (INDEPENDENT_AMBULATORY_CARE_PROVIDER_SITE_OTHER): Payer: Medicare Other | Admitting: Podiatry

## 2022-08-13 DIAGNOSIS — L03031 Cellulitis of right toe: Secondary | ICD-10-CM | POA: Diagnosis not present

## 2022-08-13 DIAGNOSIS — B351 Tinea unguium: Secondary | ICD-10-CM | POA: Diagnosis not present

## 2022-08-13 MED ORDER — DOXYCYCLINE HYCLATE 100 MG PO TABS: 100.0000 mg | ORAL_TABLET | Freq: Two times a day (BID) | ORAL | 0 refills | Status: DC

## 2022-08-13 NOTE — Progress Notes (Signed)
Subjective:  Patient ID: Brandy Peterson, female    DOB: 1930-08-05,  MRN: [id number]  Chief Complaint  Patient presents with   Nail Problem    Right hallux nail     89 y.o. female presents with the above complaint.  Patient presents with mild redness around the hallux nail.  Patient states that this came out of nowhere is progressive gotten worse.  She also has nail fungus to both of the hallux.  She wanted to discuss treatment options for this.  She is currently not taking any antibiotics she has not done anything for the nail fungus she is tried some over-the-counter option which has not helped.   Review of Systems: Negative except as noted in the HPI. Denies N/V/F/Ch.  Past Medical History:  Diagnosis Date   Carcinoma of breast (HCC)    Right   Complication of anesthesia    was told to never use Sodium Pentothal - mother had reaction. Also had trouble with intubation during mastectomy   COPD (chronic obstructive pulmonary disease) (HCC)    Family history of adverse reaction to anesthesia    GERD (gastroesophageal reflux disease)    Hemochromatosis    History of shingles    Hypertension    Hypothyroidism    IBS (irritable bowel syndrome)    Plantar fasciitis    Pneumonia    Raynaud's syndrome    Spastic colon    hx of    Thyroid disease    Varicose veins     Current Outpatient Medications:    doxycycline (VIBRA-TABS) 100 MG tablet, Take 1 tablet (100 mg total) by mouth 2 (two) times daily., Disp: 20 tablet, Rfl: 0   amLODipine (NORVASC) 2.5 MG tablet, Take 1 tablet by mouth once daily, Disp: 90 tablet, Rfl: 0   levothyroxine (SYNTHROID) 88 MCG tablet, Take 1 tablet (88 mcg total) by mouth daily before breakfast., Disp: 90 tablet, Rfl: 3   metoprolol succinate (TOPROL-XL) 25 MG 24 hr tablet, Take 1 tablet (25 mg total) by mouth daily., Disp: 90 tablet, Rfl: 3   omeprazole (PRILOSEC) 40 MG capsule, Take 1 capsule (40 mg total) by mouth daily., Disp: 90 capsule, Rfl: 3    triamterene-hydrochlorothiazide (MAXZIDE-25) 37.5-25 MG tablet, Take 1 tablet by mouth daily., Disp: 90 tablet, Rfl: 3  Social History   Tobacco Use  Smoking Status Former   Types: Cigarettes   Quit date: 04/13/1988   Years since quitting: 34.3  Smokeless Tobacco Never  Tobacco Comments   approximate quit date    Allergies  Allergen Reactions   Contrast Media [Iodinated Contrast Media] Hives, Itching and Other (See Comments)    Felt like she was going to faint    Adhesive [Tape] Other (See Comments)    Prefers paper tape   Sulfonamide Derivatives Other (See Comments)    Reaction: unknown    Objective:  There were no vitals filed for this visit. There is no height or weight on file to calculate BMI. Constitutional Well developed. Well nourished.  Vascular Dorsalis pedis pulses palpable bilaterally. Posterior tibial pulses palpable bilaterally. Capillary refill normal to all digits.  No cyanosis or clubbing noted. Pedal hair growth normal.  Neurologic Normal speech. Oriented to person, place, and time. Epicritic sensation to light touch grossly present bilaterally.  Dermatologic Nails bilateral hallux thickened elongated dystrophic mycotic nails x 2.  Mild erythema on the right hallux.  Consistent with paronychia Skin within normal limits  Orthopedic: Normal joint ROM without pain or crepitus bilaterally.  No visible deformities. No bony tenderness.   Radiographs: None Assessment:   1. Paronychia of great toe of right foot   2. Nail fungus   3. Onychomycosis due to dermatophyte    Plan:  Patient was evaluated and treated and all questions answered.  Right hallux paronychia -All questions and concerns were discussed with the patient in extensive detail -Given the amount of paronychia that is present patient will benefit from doxycycline doxycycline was dispensed I encouraged her to complete the course she states understanding will do so  Bilateral hallux  onychomycosis -I explained to patient the etiology of onychomycosis and where treatment options were discussed.  She will benefit from laser therapy.  She will schedule laser tech for laser therapy  No follow-ups on file.  Right hallux paronychia doxycycline  Bilateral hallux onychomycosis laser tech

## 2022-08-20 ENCOUNTER — Encounter: Payer: Self-pay | Admitting: Internal Medicine

## 2022-08-20 ENCOUNTER — Ambulatory Visit (INDEPENDENT_AMBULATORY_CARE_PROVIDER_SITE_OTHER): Payer: Medicare Other | Admitting: Internal Medicine

## 2022-08-20 VITALS — BP 132/80 | HR 57 | Temp 97.8°F | Ht 61.0 in | Wt 111.0 lb

## 2022-08-20 DIAGNOSIS — I2583 Coronary atherosclerosis due to lipid rich plaque: Secondary | ICD-10-CM | POA: Diagnosis not present

## 2022-08-20 DIAGNOSIS — D1801 Hemangioma of skin and subcutaneous tissue: Secondary | ICD-10-CM | POA: Insufficient documentation

## 2022-08-20 DIAGNOSIS — I10 Essential (primary) hypertension: Secondary | ICD-10-CM | POA: Diagnosis not present

## 2022-08-20 DIAGNOSIS — L814 Other melanin hyperpigmentation: Secondary | ICD-10-CM | POA: Insufficient documentation

## 2022-08-20 DIAGNOSIS — E538 Deficiency of other specified B group vitamins: Secondary | ICD-10-CM

## 2022-08-20 DIAGNOSIS — E559 Vitamin D deficiency, unspecified: Secondary | ICD-10-CM | POA: Diagnosis not present

## 2022-08-20 DIAGNOSIS — E039 Hypothyroidism, unspecified: Secondary | ICD-10-CM

## 2022-08-20 DIAGNOSIS — R739 Hyperglycemia, unspecified: Secondary | ICD-10-CM | POA: Diagnosis not present

## 2022-08-20 DIAGNOSIS — L570 Actinic keratosis: Secondary | ICD-10-CM | POA: Insufficient documentation

## 2022-08-20 DIAGNOSIS — I2510 Atherosclerotic heart disease of native coronary artery without angina pectoris: Secondary | ICD-10-CM

## 2022-08-20 DIAGNOSIS — M79674 Pain in right toe(s): Secondary | ICD-10-CM | POA: Diagnosis not present

## 2022-08-20 DIAGNOSIS — R1310 Dysphagia, unspecified: Secondary | ICD-10-CM | POA: Insufficient documentation

## 2022-08-20 DIAGNOSIS — C44629 Squamous cell carcinoma of skin of left upper limb, including shoulder: Secondary | ICD-10-CM | POA: Insufficient documentation

## 2022-08-20 DIAGNOSIS — L821 Other seborrheic keratosis: Secondary | ICD-10-CM | POA: Insufficient documentation

## 2022-08-20 DIAGNOSIS — D225 Melanocytic nevi of trunk: Secondary | ICD-10-CM | POA: Insufficient documentation

## 2022-08-20 LAB — CBC WITH DIFFERENTIAL/PLATELET
Basophils Absolute: 0 10*3/uL (ref 0.0–0.1)
Basophils Relative: 0.5 % (ref 0.0–3.0)
Eosinophils Absolute: 0.2 10*3/uL (ref 0.0–0.7)
Eosinophils Relative: 2.5 % (ref 0.0–5.0)
HCT: 45.4 % (ref 36.0–46.0)
Hemoglobin: 15.9 g/dL — ABNORMAL HIGH (ref 12.0–15.0)
Lymphocytes Relative: 20.7 % (ref 12.0–46.0)
Lymphs Abs: 1.7 10*3/uL (ref 0.7–4.0)
MCHC: 35.1 g/dL (ref 30.0–36.0)
MCV: 93.9 fl (ref 78.0–100.0)
Monocytes Absolute: 0.9 10*3/uL (ref 0.1–1.0)
Monocytes Relative: 10.2 % (ref 3.0–12.0)
Neutro Abs: 5.5 10*3/uL (ref 1.4–7.7)
Neutrophils Relative %: 66.1 % (ref 43.0–77.0)
Platelets: 288 10*3/uL (ref 150.0–400.0)
RBC: 4.83 Mil/uL (ref 3.87–5.11)
RDW: 12.8 % (ref 11.5–15.5)
WBC: 8.3 10*3/uL (ref 4.0–10.5)

## 2022-08-20 LAB — T4, FREE: Free T4: 1.23 ng/dL (ref 0.60–1.60)

## 2022-08-20 LAB — URINALYSIS, ROUTINE W REFLEX MICROSCOPIC
Bilirubin Urine: NEGATIVE
Hgb urine dipstick: NEGATIVE
Ketones, ur: NEGATIVE
Leukocytes,Ua: NEGATIVE
Nitrite: NEGATIVE
RBC / HPF: NONE SEEN (ref 0–?)
Specific Gravity, Urine: 1.01 (ref 1.000–1.030)
Total Protein, Urine: NEGATIVE
Urine Glucose: NEGATIVE
Urobilinogen, UA: 0.2 (ref 0.0–1.0)
WBC, UA: NONE SEEN (ref 0–?)
pH: 7.5 (ref 5.0–8.0)

## 2022-08-20 LAB — BASIC METABOLIC PANEL
BUN: 18 mg/dL (ref 6–23)
CO2: 29 mEq/L (ref 19–32)
Calcium: 9.7 mg/dL (ref 8.4–10.5)
Chloride: 94 mEq/L — ABNORMAL LOW (ref 96–112)
Creatinine, Ser: 0.86 mg/dL (ref 0.40–1.20)
GFR: 59.81 mL/min — ABNORMAL LOW (ref >60.00)
Glucose, Bld: 93 mg/dL (ref 70–99)
Potassium: 4.4 mEq/L (ref 3.5–5.1)
Sodium: 132 mEq/L — ABNORMAL LOW (ref 135–145)

## 2022-08-20 LAB — URIC ACID: Uric Acid, Serum: 5.4 mg/dL (ref 2.4–7.0)

## 2022-08-20 LAB — TSH: TSH: 5.49 u[IU]/mL (ref 0.35–5.50)

## 2022-08-20 LAB — LIPID PANEL
Cholesterol: 234 mg/dL — ABNORMAL HIGH (ref 0–200)
HDL: 89.6 mg/dL (ref >39.00)
LDL Cholesterol: 127 mg/dL — ABNORMAL HIGH (ref 0–99)
NonHDL: 144.19
Total CHOL/HDL Ratio: 3
Triglycerides: 86 mg/dL (ref 0.0–149.0)
VLDL: 17.2 mg/dL (ref 0.0–40.0)

## 2022-08-20 LAB — HEPATIC FUNCTION PANEL
ALT: 18 U/L (ref 0–35)
AST: 25 U/L (ref 0–37)
Albumin: 4.5 g/dL (ref 3.5–5.2)
Alkaline Phosphatase: 102 U/L (ref 39–117)
Bilirubin, Direct: 0.1 mg/dL (ref 0.0–0.3)
Total Bilirubin: 0.8 mg/dL (ref 0.2–1.2)
Total Protein: 7.6 g/dL (ref 6.0–8.3)

## 2022-08-20 LAB — HEMOGLOBIN A1C: Hgb A1c MFr Bld: 5.2 % (ref 4.6–6.5)

## 2022-08-20 LAB — VITAMIN D 25 HYDROXY (VIT D DEFICIENCY, FRACTURES): VITD: 41.73 ng/mL (ref 30.00–100.00)

## 2022-08-20 LAB — VITAMIN B12: Vitamin B-12: 500 pg/mL (ref 211–911)

## 2022-08-20 MED ORDER — TRIAMTERENE-HCTZ 37.5-25 MG PO TABS: 1.0000 | ORAL_TABLET | Freq: Every day | ORAL | 3 refills | Status: DC

## 2022-08-20 MED ORDER — OMEPRAZOLE 40 MG PO CPDR: 40.0000 mg | DELAYED_RELEASE_CAPSULE | Freq: Every day | ORAL | 3 refills | Status: DC

## 2022-08-20 MED ORDER — METOPROLOL SUCCINATE ER 25 MG PO TB24: 25.0000 mg | ORAL_TABLET | Freq: Every day | ORAL | 3 refills | Status: DC

## 2022-08-20 MED ORDER — LEVOTHYROXINE SODIUM 88 MCG PO TABS: 88.0000 ug | ORAL_TABLET | Freq: Every day | ORAL | 3 refills | Status: DC

## 2022-08-20 NOTE — Progress Notes (Signed)
The test results show that your current treatment is OK, as the tests are stable.  Please continue the same plan.  There is no other need for change of treatment or further evaluation based on these results, at this time.  thanks 

## 2022-08-20 NOTE — Patient Instructions (Signed)
Please continue all other medications as before, and refills have been done as requested, except for the amlodipine  Please have the pharmacy call with any other refills you may need.  Please have your Shingrix (shingles) shots done at your local pharmacy.  Please continue your efforts at being more active, low cholesterol diet, and weight control.  You are otherwise up to date with prevention measures today.  Please keep your appointments with your specialists as you may have planned  You will be contacted regarding the referral for: Dr Antoine Poche  Please go to the LAB at the blood drawing area for the tests to be done, including the uric acid  You will be contacted by phone if any changes need to be made immediately.  Otherwise, you will receive a letter about your results with an explanation, but please check with MyChart first.  Please remember to sign up for MyChart if you have not done so, as this will be important to you in the future with finding out test results, communicating by private email, and scheduling acute appointments online when needed.  Please make an Appointment to return in 6 months, or sooner if needed

## 2022-08-20 NOTE — Progress Notes (Signed)
Patient ID: Brandy Peterson, female   DOB: 1930-04-22, 89 y.o.   MRN: [id number]        Chief Complaint: follow up right great toe pain, low thyroid, htn, low vit d       HPI:  Brandy Peterson is a 89 y.o. female here with c/o midl intermittent right great toe pain with walking for 2 wks, concerned about gout but no sweling, andn ot worse at night under the sheets.  No recent trauma, fever.  Denies hyper or hypo thyroid symptoms such as voice, skin or hair change., Pt denies chest pain, increased sob or doe, wheezing, orthopnea, PND, increased LE swelling, palpitations, dizziness or syncope.   Did have cardiac PET and Echo after last cardiology appt nov 2023 but has not f/u as recommended after that.  Willing to call for f/u appt. Wt Readings from Last 3 Encounters:  08/20/22 111 lb (50.3 kg)  04/24/22 108 lb 12.8 oz (49.4 kg)  02/13/22 108 lb 9.6 oz (49.3 kg)   BP Readings from Last 3 Encounters:  08/20/22 132/80  04/24/22 (!) 159/69  03/10/22 (!) 118/42         Past Medical History:  Diagnosis Date   Carcinoma of breast (HCC)    Right   Complication of anesthesia    was told to never use Sodium Pentothal - mother had reaction. Also had trouble with intubation during mastectomy   COPD (chronic obstructive pulmonary disease) (HCC)    Family history of adverse reaction to anesthesia    GERD (gastroesophageal reflux disease)    Hemochromatosis    History of shingles    Hypertension    Hypothyroidism    IBS (irritable bowel syndrome)    Plantar fasciitis    Pneumonia    Raynaud's syndrome    Spastic colon    hx of    Thyroid disease    Varicose veins    Past Surgical History:  Procedure Laterality Date   ABDOMINAL HYSTERECTOMY  1979   PARTIAL   APPENDECTOMY     BONE SPUR  1970   BOTH FEET   CATARACT EXTRACTION  10/2012 and 12/2012   bilateral with lens implants   COLONOSCOPY     KYPHOPLASTY N/A 10/19/2014   Procedure: Lumbar 1 kyphoplasty;  Surgeon: Shirlean Kelly, MD;  Location:  [facility];  Service: Neurosurgery;  Laterality: N/A;   LUMBAR LAMINECTOMY  1991   L5-S1   MASTECTOMY  1994   RIGHT   MASTECTOMY  1994   Right    reports that she quit smoking about 34 years ago. Her smoking use included cigarettes. She has never used smokeless tobacco. She reports current alcohol use of about 3.0 standard drinks of alcohol per week. She reports that she does not use drugs. family history includes Arthritis in her father; Bleeding Disorder in her mother; Cancer in her mother; Deep vein thrombosis in her father; Heart attack in her father; Heart attack (age of onset: 63) in her brother; Heart disease in her brother; Heart disease (age of onset: 80) in her father; Hemochromatosis in her brother and father; Hyperlipidemia in her son; Hypertension in her father and mother; Hypothyroidism in her mother; Osteoarthritis in her mother; Varicose Veins in her father and mother. Allergies  Allergen Reactions   Contrast Media [Iodinated Contrast Media] Hives, Itching and Other (See Comments)    Felt like she was going to faint    Adhesive [Tape] Other (See Comments)    Prefers paper  tape   Sulfonamide Derivatives Other (See Comments)    Reaction: unknown    Current Outpatient Medications on File Prior to Visit  Medication Sig Dispense Refill   amLODipine (NORVASC) 2.5 MG tablet Take 1 tablet by mouth once daily 90 tablet 0   doxycycline (VIBRA-TABS) 100 MG tablet Take 1 tablet (100 mg total) by mouth 2 (two) times daily. 20 tablet 0   No current facility-administered medications on file prior to visit.        ROS:  All others reviewed and negative.  Objective        PE:  BP 132/80 (BP Location: Left Arm, Patient Position: Sitting, Cuff Size: Normal)   Pulse (!) 57   Temp 97.8 F (36.6 C) (Oral)   Ht 5\' 1"  (1.549 m)   Wt 111 lb (50.3 kg)   SpO2 97%   BMI 20.97 kg/m                 Constitutional: Pt appears in NAD               HENT: Head: NCAT.                Right  Ear: External ear normal.                 Left Ear: External ear normal.                Eyes: . Pupils are equal, round, and reactive to light. Conjunctivae and EOM are normal               Nose: without d/c or deformity               Neck: Neck supple. Gross normal ROM               Cardiovascular: Normal rate and regular rhythm.                 Pulmonary/Chest: Effort normal and breath sounds without rales or wheezing.                Abd:  Soft, NT, ND, + BS, no organomegaly               Neurological: Pt is alert. At baseline orientation, motor grossly intact               Skin: Skin is warm. No rashes, no other new lesions, LE edema - none, right great toe with degenerative change               Psychiatric: Pt behavior is normal without agitation   Micro: none  Cardiac tracings I have personally interpreted today:  none  Pertinent Radiological findings (summarize): none   Lab Results  Component Value Date   WBC 8.3 08/20/2022   HGB 15.9 (H) 08/20/2022   HCT 45.4 08/20/2022   PLT 288.0 08/20/2022   GLUCOSE 93 08/20/2022   CHOL 234 (H) 08/20/2022   TRIG 86.0 08/20/2022   HDL 89.60 08/20/2022   LDLDIRECT 93.6 12/26/2010   LDLCALC 127 (H) 08/20/2022   ALT 18 08/20/2022   AST 25 08/20/2022   NA 132 (L) 08/20/2022   K 4.4 08/20/2022   CL 94 (L) 08/20/2022   CREATININE 0.86 08/20/2022   BUN 18 08/20/2022   CO2 29 08/20/2022   TSH 5.49 08/20/2022   HGBA1C 5.2 08/20/2022   Assessment/Plan:  Brandy Peterson is a 89 y.o. White or Caucasian [1] female with  has a past medical history of Carcinoma of breast (HCC), Complication of anesthesia, COPD (chronic obstructive pulmonary disease) (HCC), Family history of adverse reaction to anesthesia, GERD (gastroesophageal reflux disease), Hemochromatosis, History of shingles, Hypertension, Hypothyroidism, IBS (irritable bowel syndrome), Plantar fasciitis, Pneumonia, Raynaud's syndrome, Spastic colon, Thyroid disease, and Varicose  veins.  Hypothyroidism Lab Results  Component Value Date   TSH 5.49 08/20/2022   Stable, pt to continue levothyroxine 88 mcg qd  Essential hypertension BP Readings from Last 3 Encounters:  08/20/22 132/80  04/24/22 (!) 159/69  03/10/22 (!) 118/42   Stable, pt to continue medical treatment norvasc 2.5 qd, toprol xl 25 qd, maxide 25 - 1 qd   Vitamin D deficiency Last vitamin D Lab Results  Component Value Date   VD25OH 41.73 08/20/2022   Stable, cont oral replacement   Hyperglycemia Lab Results  Component Value Date   HGBA1C 5.2 08/20/2022   Stable, pt to continue current medical treatment  - diet, wt control   Pain of toe of right foot Exam with likely OA changes, ok for uric acid but does not appear to be c/w acute gout; overall mild - for tylenol prn  Coronary artery disease due to lipid rich plaque Pt to call for f/u appt with cardiology   Followup: Return in about 6 months (around 02/20/2023).  Oliver Barre, MD 08/23/2022 8:56 PM [facility]

## 2022-08-23 ENCOUNTER — Encounter: Payer: Self-pay | Admitting: Internal Medicine

## 2022-08-23 DIAGNOSIS — I2510 Atherosclerotic heart disease of native coronary artery without angina pectoris: Secondary | ICD-10-CM | POA: Insufficient documentation

## 2022-08-23 DIAGNOSIS — M79674 Pain in right toe(s): Secondary | ICD-10-CM | POA: Insufficient documentation

## 2022-08-23 NOTE — Assessment & Plan Note (Signed)
Last vitamin D Lab Results  Component Value Date   VD25OH 41.73 08/20/2022   Stable, cont oral replacement

## 2022-08-23 NOTE — Assessment & Plan Note (Signed)
BP Readings from Last 3 Encounters:  08/20/22 132/80  04/24/22 (!) 159/69  03/10/22 (!) 118/42   Stable, pt to continue medical treatment norvasc 2.5 qd, toprol xl 25 qd, maxide 25 - 1 qd

## 2022-08-23 NOTE — Assessment & Plan Note (Signed)
Pt to call for f/u appt with cardiology

## 2022-08-23 NOTE — Assessment & Plan Note (Signed)
Lab Results  Component Value Date   HGBA1C 5.2 08/20/2022   Stable, pt to continue current medical treatment  - diet, wt control

## 2022-08-23 NOTE — Assessment & Plan Note (Addendum)
Exam with likely OA changes, ok for uric acid but does not appear to be c/w acute gout; overall mild - for tylenol prn

## 2022-08-23 NOTE — Assessment & Plan Note (Signed)
Lab Results  Component Value Date   TSH 5.49 08/20/2022   Stable, pt to continue levothyroxine 88 mcg qd

## 2022-08-31 ENCOUNTER — Encounter: Payer: Self-pay | Admitting: Podiatry

## 2022-08-31 ENCOUNTER — Ambulatory Visit (INDEPENDENT_AMBULATORY_CARE_PROVIDER_SITE_OTHER): Payer: Medicare Other | Admitting: Podiatry

## 2022-08-31 DIAGNOSIS — B351 Tinea unguium: Secondary | ICD-10-CM

## 2022-08-31 DIAGNOSIS — R931 Abnormal findings on diagnostic imaging of heart and coronary circulation: Secondary | ICD-10-CM | POA: Insufficient documentation

## 2022-08-31 DIAGNOSIS — I700 Atherosclerosis of aorta: Secondary | ICD-10-CM | POA: Insufficient documentation

## 2022-08-31 MED ORDER — CICLOPIROX 8 % EX SOLN: Freq: Every day | CUTANEOUS | 0 refills | Status: DC

## 2022-08-31 NOTE — Progress Notes (Signed)
  Subjective:  Patient ID: Brandy Peterson, female    DOB: [birth year],   MRN: [id number]  Chief Complaint  Patient presents with   Nail Problem     Fungus f/u    89 y.o. female presents for concern of right great toe fungus. Was treated for paronychia and on doxycycline by Dr. Allena Katz and the toe cleared up. Relates she never started laser treatments because she was not clear on what to do and no happy with how her nail looked. Here for follow-up . Denies any other pedal complaints. Denies n/v/f/c.   Past Medical History:  Diagnosis Date   Carcinoma of breast (HCC)    Right   Complication of anesthesia    was told to never use Sodium Pentothal - mother had reaction. Also had trouble with intubation during mastectomy   COPD (chronic obstructive pulmonary disease) (HCC)    Family history of adverse reaction to anesthesia    GERD (gastroesophageal reflux disease)    Hemochromatosis    History of shingles    Hypertension    Hypothyroidism    IBS (irritable bowel syndrome)    Plantar fasciitis    Pneumonia    Raynaud's syndrome    Spastic colon    hx of    Thyroid disease    Varicose veins     Objective:  Physical Exam: Vascular: DP/PT pulses 2/4 bilateral. CFT <3 seconds. Normal hair growth on digits. No edema.  Skin. No lacerations or abrasions bilateral feet. Right hallux nail is thickened and dystrophic with subungual debris. Some thicknesss noted to left hallux nail as well.  Musculoskeletal: MMT 5/5 bilateral lower extremities in DF, PF, Inversion and Eversion. Deceased ROM in DF of ankle joint.  Neurological: Sensation intact to light touch.   Assessment:   1. Onychomycosis due to dermatophyte      Plan:  Patient was evaluated and treated and all questions answered. -Examined patient -Discussed treatment options for painful dystrophic nails  -Discussed fungal nail treatment options including oral, topical, and laser treatments.  -Penlac sent to pharmacy and will also  try laser fungal nail treatments. Patient to be scheduled.  -Patient to return in 3 months.   Louann Sjogren, DPM

## 2022-08-31 NOTE — Progress Notes (Unsigned)
  Cardiology Office Note:   Date:  09/01/2022  ID:  Brandy Peterson, DOB [birth year], MRN [id number]  History of Present Illness:   Brandy Peterson is a 89 y.o. female who presents for evaluation of elevated coronary calcium.  CT in 2022 demonstrated coronary , mitral annular and aortic calcium and atherosclerosis.  She did have stress testing in 2018 that I see and that was negative.   After my last visit with her I sent her for a PET which was negative for ischemia or infarct.  BNP was mildly elevated.   Echo had a normal EF.     She returns and she is doing relatively well.  She does her household chores though she is not pushing the lawnmower right now.  She only gets mildly short of breath.  She is not having any new PND or orthopnea.  She denies any chest pressure, neck or arm discomfort.  She has no weight gain or edema.  ROS: As stated in the HPI and negative for all other systems.  Studies Reviewed:    EKG: Sinus rhythm, rate 65, right bundle branch block incomplete, left axis deviation, left anterior fascicular block   Risk Assessment/Calculations:           Physical Exam:   VS:  BP (!) 174/82   Pulse 65   Ht 5\' 1"  (1.549 m)   Wt 110 lb 6.4 oz (50.1 kg)   SpO2 97%   BMI 20.86 kg/m    Wt Readings from Last 3 Encounters:  09/01/22 110 lb 6.4 oz (50.1 kg)  08/20/22 111 lb (50.3 kg)  04/24/22 108 lb 12.8 oz (49.4 kg)     GEN: Well nourished, well developed in no acute distress NECK: No JVD; No carotid bruits CARDIAC: RRR, no murmurs, rubs, gallops RESPIRATORY:  Clear to auscultation without rales, wheezing or rhonchi  ABDOMEN: Soft, non-tender, non-distended EXTREMITIES:  No edema; No deformity   ASSESSMENT AND PLAN:   Elevated coronary calcium/shortness of breath:   She had a negative perfusion study.  Her shortness of breath may be related to some mild COPD that has been diagnosed.  She thinks it is mild and no change in therapy is planned.   Aortic atherosclerosis:   Given  the scan above no further ischemia workup is suggested and she will continue with primary risk reduction.   HTN:    At the last visit I switched her from Cozaar to amlodipine.  This was done because he wanted to come off of Cozaar with her potassium sparing diuretic and this was reasonable.  I started amlodipine 2 and half milligrams daily.  Her blood pressure is not controlled and so I will increase this to 5 mg daily and she will keep a diary.    Dizziness:   She is no longer complaining of this.  No change in therapy.  No further workup.  Abnormal EKG: She has no symptoms related to her conduction disturbance.  No change in therapy.      Signed, Rollene Rotunda, MD

## 2022-09-01 ENCOUNTER — Encounter: Payer: Self-pay | Admitting: Cardiology

## 2022-09-01 ENCOUNTER — Ambulatory Visit: Payer: Medicare Other | Attending: Cardiology | Admitting: Cardiology

## 2022-09-01 VITALS — BP 174/82 | HR 65 | Ht 61.0 in | Wt 110.4 lb

## 2022-09-01 DIAGNOSIS — I700 Atherosclerosis of aorta: Secondary | ICD-10-CM

## 2022-09-01 DIAGNOSIS — R931 Abnormal findings on diagnostic imaging of heart and coronary circulation: Secondary | ICD-10-CM | POA: Diagnosis not present

## 2022-09-01 DIAGNOSIS — I10 Essential (primary) hypertension: Secondary | ICD-10-CM | POA: Insufficient documentation

## 2022-09-01 MED ORDER — AMLODIPINE BESYLATE 5 MG PO TABS: 5.0000 mg | ORAL_TABLET | Freq: Every day | ORAL | 3 refills | Status: DC

## 2022-09-01 NOTE — Patient Instructions (Signed)
Medication Instructions:  INCREASE AMLODIPINE TO 5mg  ONCE DAILY   PLEASE KEEP BLOOD PRESSURE LOG- SEND [place] THIS OR MAIL IT IN 1 MONTHS *If you need a refill on your cardiac medications before your next appointment, please call your pharmacy*  Follow-Up: At [facility], you and your health needs are our priority.  As part of our continuing mission to provide you with exceptional heart care, we have created designated Provider Care Teams.  These Care Teams include your primary Cardiologist (physician) and Advanced Practice Providers (APPs -  Physician Assistants and Nurse Practitioners) who all work together to provide you with the care you need, when you need it.  Your next appointment:   4 month(s)  Provider:   ANY APP

## 2022-09-02 ENCOUNTER — Encounter: Payer: Self-pay | Admitting: Cardiology

## 2022-09-09 ENCOUNTER — Ambulatory Visit: Payer: Medicare Other | Admitting: Podiatry

## 2022-10-01 DIAGNOSIS — R350 Frequency of micturition: Secondary | ICD-10-CM | POA: Diagnosis not present

## 2022-10-01 DIAGNOSIS — N130 Hydronephrosis with ureteropelvic junction obstruction: Secondary | ICD-10-CM | POA: Diagnosis not present

## 2022-10-06 ENCOUNTER — Ambulatory Visit: Payer: Medicare Other | Admitting: Cardiology

## 2022-10-09 ENCOUNTER — Telehealth: Payer: Self-pay | Admitting: Hematology

## 2022-10-12 DIAGNOSIS — D485 Neoplasm of uncertain behavior of skin: Secondary | ICD-10-CM | POA: Diagnosis not present

## 2022-10-12 DIAGNOSIS — L570 Actinic keratosis: Secondary | ICD-10-CM | POA: Diagnosis not present

## 2022-10-12 DIAGNOSIS — C44722 Squamous cell carcinoma of skin of right lower limb, including hip: Secondary | ICD-10-CM | POA: Diagnosis not present

## 2022-10-13 ENCOUNTER — Encounter: Payer: Self-pay | Admitting: *Deleted

## 2022-10-14 ENCOUNTER — Telehealth: Payer: Self-pay | Admitting: *Deleted

## 2022-10-14 MED ORDER — HYDRALAZINE HCL 10 MG PO TABS: 10.0000 mg | ORAL_TABLET | Freq: Two times a day (BID) | ORAL | 3 refills | Status: DC

## 2022-10-14 MED ORDER — AMLODIPINE BESYLATE 2.5 MG PO TABS: 2.5000 mg | ORAL_TABLET | Freq: Every day | ORAL | 3 refills | Status: DC

## 2022-10-14 NOTE — Telephone Encounter (Signed)
Spoke with pt, she had mailed her blood pressure log to [place] and she had written a note reporting swelling in both legs and feet after increasing the amlodipine.Dr Antoine Poche reviewed and patient was instructed to reduce the amlodipine back to 2.5 mg once daily and we added hydralazine 10 mg one tablet twice daily. New script sent to the pharmacy. She will let us know if the swelling does not improve and she will watch her blood pressure. She does report she had some sores removed from her ankle so she is not real sure amlodipine was the problem.

## 2022-10-21 DIAGNOSIS — N130 Hydronephrosis with ureteropelvic junction obstruction: Secondary | ICD-10-CM | POA: Diagnosis not present

## 2022-10-30 ENCOUNTER — Ambulatory Visit (INDEPENDENT_AMBULATORY_CARE_PROVIDER_SITE_OTHER): Payer: Medicare Other | Admitting: Podiatry

## 2022-10-30 DIAGNOSIS — B351 Tinea unguium: Secondary | ICD-10-CM

## 2022-10-30 NOTE — Progress Notes (Signed)
Patient presents for laser nail treatment #1.  She was referred for laser by Dr. Ralene Cork.  States that the fungus is in bilateral hallux nails only.  Notes the ciclopirox made her left hallux nail look worse.  The bilateral hallux nails were debrided with sterile nail nippers and power debriding burr.  The [facility] system was used to treat the bilateral hallux nails via two passes with the handpiece.  She tolerated this well.  F/u 4 weeks for laser #2

## 2022-11-10 ENCOUNTER — Other Ambulatory Visit: Payer: Medicare Other

## 2022-11-10 ENCOUNTER — Ambulatory Visit: Payer: Medicare Other | Admitting: Hematology

## 2022-11-27 ENCOUNTER — Other Ambulatory Visit: Payer: Self-pay

## 2022-11-27 ENCOUNTER — Other Ambulatory Visit: Payer: Medicare Other

## 2022-11-30 ENCOUNTER — Inpatient Hospital Stay: Payer: Medicare Other

## 2022-11-30 ENCOUNTER — Encounter: Payer: Self-pay | Admitting: Hematology

## 2022-11-30 ENCOUNTER — Inpatient Hospital Stay: Payer: Medicare Other | Attending: Hematology | Admitting: Hematology

## 2022-11-30 DIAGNOSIS — Z9011 Acquired absence of right breast and nipple: Secondary | ICD-10-CM | POA: Diagnosis not present

## 2022-11-30 DIAGNOSIS — Z87891 Personal history of nicotine dependence: Secondary | ICD-10-CM | POA: Insufficient documentation

## 2022-11-30 DIAGNOSIS — Z853 Personal history of malignant neoplasm of breast: Secondary | ICD-10-CM | POA: Insufficient documentation

## 2022-11-30 LAB — CMP (CANCER CENTER ONLY)
ALT: 14 U/L (ref 0–44)
AST: 22 U/L (ref 15–41)
Albumin: 4 g/dL (ref 3.5–5.0)
Alkaline Phosphatase: 81 U/L (ref 38–126)
Anion gap: 7 (ref 5–15)
BUN: 15 mg/dL (ref 8–23)
CO2: 29 mmol/L (ref 22–32)
Calcium: 9.1 mg/dL (ref 8.9–10.3)
Chloride: 96 mmol/L — ABNORMAL LOW (ref 98–111)
Creatinine: 0.73 mg/dL (ref 0.44–1.00)
GFR, Estimated: >60 mL/min (ref >60)
Glucose, Bld: 95 mg/dL (ref 70–99)
Potassium: 4.3 mmol/L (ref 3.5–5.1)
Sodium: 132 mmol/L — ABNORMAL LOW (ref 135–145)
Total Bilirubin: 0.9 mg/dL (ref 0.3–1.2)
Total Protein: 6.8 g/dL (ref 6.5–8.1)

## 2022-11-30 LAB — CBC WITH DIFFERENTIAL (CANCER CENTER ONLY)
Abs Immature Granulocytes: 0.04 10*3/uL (ref 0.00–0.07)
Basophils Absolute: 0 10*3/uL (ref 0.0–0.1)
Basophils Relative: 1 %
Eosinophils Absolute: 0.2 10*3/uL (ref 0.0–0.5)
Eosinophils Relative: 4 %
HCT: 39.8 % (ref 36.0–46.0)
Hemoglobin: 14 g/dL (ref 12.0–15.0)
Immature Granulocytes: 1 %
Lymphocytes Relative: 19 %
Lymphs Abs: 1.2 10*3/uL (ref 0.7–4.0)
MCH: 32.4 pg (ref 26.0–34.0)
MCHC: 35.2 g/dL (ref 30.0–36.0)
MCV: 92.1 fL (ref 80.0–100.0)
Monocytes Absolute: 0.6 10*3/uL (ref 0.1–1.0)
Monocytes Relative: 9 %
Neutro Abs: 4.4 10*3/uL (ref 1.7–7.7)
Neutrophils Relative %: 66 %
Platelet Count: 233 10*3/uL (ref 150–400)
RBC: 4.32 MIL/uL (ref 3.87–5.11)
RDW: 12.3 % (ref 11.5–15.5)
WBC Count: 6.6 10*3/uL (ref 4.0–10.5)
nRBC: 0 % (ref 0.0–0.2)

## 2022-11-30 LAB — FERRITIN: Ferritin: 97 ng/mL (ref 11–307)

## 2022-11-30 LAB — IRON AND IRON BINDING CAPACITY (CC-WL,HP ONLY)
Iron: 259 ug/dL — ABNORMAL HIGH (ref 28–170)
Saturation Ratios: 97 % — ABNORMAL HIGH (ref 10.4–31.8)
TIBC: 267 ug/dL (ref 250–450)
UIBC: 8 ug/dL — ABNORMAL LOW (ref 148–442)

## 2022-11-30 NOTE — Progress Notes (Signed)
Marland Kitchen    HEMATOLOGY/ONCOLOGY CLINIC NOTE  Date of Service: 11/30/22    Patient Care Team: Corwin Levins, MD as PCP - General (Internal Medicine) Rollene Rotunda, MD as PCP - Cardiology (Cardiology) Cherlyn Roberts, MD (Dermatology) Carman Ching, MD (Inactive) (Gastroenterology) Marcene Corning, MD as Consulting Physician (Orthopedic Surgery) Johney Maine, MD as Consulting Physician (Hematology)  CHIEF COMPLAINTS/PURPOSE OF CONSULTATION:  Follow-up for continued evaluation and management of hereditary hemochromatosis  HISTORY OF PRESENTING ILLNESS:   Brandy Peterson is a wonderful 89 y.o. female who has been referred to [place] by Dr Darrold Span for ongoing management of hemochromatosis. She notes that she has a family history of hemochromatosis and was diagnosed in the 81s. She has required maintenance therapy to phlebotomies roughly every 6 months to try to keep her ferritin levels below 50. She notes no known history of liver disease or heart disease. She reports that her ferritin levels even on diagnosis but not more than 1000. She does not recollect what her genetic mutation studies showed. Patient today has no acute new concerns. No new skin pigmentation changes. No new fatigue. No abdominal pain or distention. No shortness of breath or DOE.  INTERVAL HISTORY:  Brandy Peterson is here for her scheduled follow-up for hereditary hemochromatosis.  Patient was last seen by me on 04/24/2022 and she complained of worsened urinary incompetence, mild constipation, occasional leg swelling, chronic back pain, mild SOB when exerting by lifting heavy objects.   Patient notes she has been doing fairly well since our last visit. Today, she reports hypertension and other several issues which she does not believe are related to her Hemachromatosis.    Patient complains of right leg edema during this visit. She regularly takes Amlodipine 2.5 mg. Her last phlebotomy was around one year go. She denies  taking iron supplement.   She denies any recent infection issues, fever, chills, night sweats, abdominal pain, back pain, chest pain, or abnormal bowel movement.   MEDICAL HISTORY:  Past Medical History:  Diagnosis Date   Carcinoma of breast (HCC)    Right   Complication of anesthesia    was told to never use Sodium Pentothal - mother had reaction. Also had trouble with intubation during mastectomy   COPD (chronic obstructive pulmonary disease) (HCC)    Family history of adverse reaction to anesthesia    GERD (gastroesophageal reflux disease)    Hemochromatosis    History of shingles    Hypertension    Hypothyroidism    IBS (irritable bowel syndrome)    Plantar fasciitis    Pneumonia    Raynaud's syndrome    Spastic colon    hx of    Thyroid disease    Varicose veins     SURGICAL HISTORY: Past Surgical History:  Procedure Laterality Date   ABDOMINAL HYSTERECTOMY  1979   PARTIAL   APPENDECTOMY     BONE SPUR  1970   BOTH FEET   CATARACT EXTRACTION  10/2012 and 12/2012   bilateral with lens implants   COLONOSCOPY     KYPHOPLASTY N/A 10/19/2014   Procedure: Lumbar 1 kyphoplasty;  Surgeon: Shirlean Kelly, MD;  Location: [facility];  Service: Neurosurgery;  Laterality: N/A;   LUMBAR LAMINECTOMY  1991   L5-S1   MASTECTOMY  1994   RIGHT   MASTECTOMY  1994   Right    SOCIAL HISTORY: Social History   Socioeconomic History   Marital status: Widowed    Spouse name: Not on file  Number of children: 1   Years of education: 12   Highest education level: High school graduate  Occupational History   Occupation: Retired  Tobacco Use   Smoking status: Former    Current packs/day: 0.00    Types: Cigarettes    Quit date: 04/13/1988    Years since quitting: 34.6   Smokeless tobacco: Never   Tobacco comments:    approximate quit date  Vaping Use   Vaping status: Never Used  Substance and Sexual Activity   Alcohol use: Yes    Alcohol/week: 3.0 standard drinks of  alcohol    Types: 3 Glasses of wine per week    Comment: 2oz of wine occasionally   Drug use: No   Sexual activity: Never  Other Topics Concern   Not on file  Social History Narrative   Engineer, agricultural, 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. Pt married in 1953 and was widowed in 2002. She now lives alone I-ADLs. Pt has 1 son born 51 and 2 grandchildren.       Pt had a colonoscopy on 05/09/2012 by Dr Randa Evens.       Pt reports having a pneumo vaccine.      Right-handed.   4-5 cups caffeine daily.   Lives alone.   Social Determinants of Health   Financial Resource Strain: Low Risk  (06/16/2017)   Overall Financial Resource Strain (CARDIA)    Difficulty of Paying Living Expenses: Not hard at all  Food Insecurity: No Food Insecurity (06/16/2017)   Hunger Vital Sign    Worried About Running Out of Food in the Last Year: Never true    Ran Out of Food in the Last Year: Never true  Transportation Needs: No Transportation Needs (06/16/2017)   PRAPARE - Administrator, Civil Service (Medical): No    Lack of Transportation (Non-Medical): No  Physical Activity: Inactive (06/16/2017)   Exercise Vital Sign    Days of Exercise per Week: 0 days    Minutes of Exercise per Session: 0 min  Stress: No Stress Concern Present (06/16/2017)   [employer] of Occupational Health - Occupational Stress Questionnaire    Feeling of Stress : Only a little  Social Connections: Unknown (06/16/2017)   Social Connection and Isolation Panel [NHANES]    Frequency of Communication with Friends and Family: More than three times a week    Frequency of Social Gatherings with Friends and Family: More than three times a week    Attends Religious Services: More than 4 times per year    Active Member of [employer] or Organizations: Not on file    Attends Banker Meetings: Not on file    Marital Status: Widowed  Catering manager Violence: Not on file    FAMILY HISTORY: Family History  Problem  Relation Age of Onset   Cancer Mother        metastatic cancer (pancreas vs lung)   Hypertension Mother    Osteoarthritis Mother    Hypothyroidism Mother    Varicose Veins Mother    Bleeding Disorder Mother    Heart disease Father 74       CAD/MI   Hypertension Father    Arthritis Father    Hemochromatosis Father        Possible   Deep vein thrombosis Father    Varicose Veins Father    Heart attack Father    Heart attack Brother 14   Hemochromatosis Brother    Heart disease Brother  Hyperlipidemia Son    Diabetes Neg Hx     ALLERGIES:  is allergic to contrast media [iodinated contrast media], adhesive [tape], and sulfonamide derivatives.  MEDICATIONS:  Current Outpatient Medications  Medication Sig Dispense Refill   amLODipine (NORVASC) 2.5 MG tablet Take 1 tablet (2.5 mg total) by mouth daily. 90 tablet 3   ciclopirox (PENLAC) 8 % solution Apply topically at bedtime. Apply over nail and surrounding skin. Apply daily over previous coat. After seven (7) days, may remove with alcohol and continue cycle. 6.6 mL 0   doxycycline (VIBRA-TABS) 100 MG tablet Take 1 tablet (100 mg total) by mouth 2 (two) times daily. 20 tablet 0   hydrALAZINE (APRESOLINE) 10 MG tablet Take 1 tablet (10 mg total) by mouth 2 (two) times daily. 180 tablet 3   levothyroxine (SYNTHROID) 88 MCG tablet Take 1 tablet (88 mcg total) by mouth daily before breakfast. 90 tablet 3   metoprolol succinate (TOPROL-XL) 25 MG 24 hr tablet Take 1 tablet (25 mg total) by mouth daily. 90 tablet 3   omeprazole (PRILOSEC) 40 MG capsule Take 1 capsule (40 mg total) by mouth daily. 90 capsule 3   triamterene-hydrochlorothiazide (MAXZIDE-25) 37.5-25 MG tablet Take 1 tablet by mouth daily. 90 tablet 3   No current facility-administered medications for this visit.    REVIEW OF SYSTEMS:    10 Point review of Systems was done is negative except as noted above.   PHYSICAL EXAMINATION: ECOG PERFORMANCE STATUS: 1 - Symptomatic  but completely ambulatory . Vitals:   11/30/22 1345  BP: (!) 159/68  Pulse: 66  Resp: 18  Temp: (!) 97.5 F (36.4 C)  SpO2: 98%     Filed Weights   11/30/22 1345  Weight: 110 lb 9.6 oz (50.2 kg)    .Body mass index is 20.9 kg/m.  GENERAL:alert, in no acute distress and comfortable SKIN: no acute rashes, no significant lesions EYES: conjunctiva are pink and non-injected, sclera anicteric OROPHARYNX: MMM, no exudates, no oropharyngeal erythema or ulceration NECK: supple, no JVD LYMPH:  no palpable lymphadenopathy in the cervical, axillary or inguinal regions LUNGS: clear to auscultation b/l with normal respiratory effort HEART: regular rate & rhythm ABDOMEN:  normoactive bowel sounds , non tender, not distended. Extremity: no pedal edema PSYCH: alert & oriented x 3 with fluent speech NEURO: no focal motor/sensory deficits    LABORATORY DATA:  I have reviewed the data as listed  .    Latest Ref Rng & Units 11/30/2022   12:27 PM 08/20/2022   10:34 AM 04/24/2022    1:21 PM  CBC  WBC 4.0 - 10.5 K/uL 6.6  8.3  7.7   Hemoglobin 12.0 - 15.0 g/dL 16.1  09.6  04.5   Hematocrit 36.0 - 46.0 % 39.8  45.4  42.3   Platelets 150 - 400 K/uL 233  288.0  258     .    Latest Ref Rng & Units 11/30/2022   12:27 PM 08/20/2022   10:34 AM 06/25/2021    2:54 PM  CMP  Glucose 70 - 99 mg/dL 95  93  89   BUN 8 - 23 mg/dL 15  18  16    Creatinine 0.44 - 1.00 mg/dL 4.09  8.11  9.14   Sodium 135 - 145 mmol/L 132  132  134   Potassium 3.5 - 5.1 mmol/L 4.3  4.4  4.3   Chloride 98 - 111 mmol/L 96  94  98   CO2 22 - 32 mmol/L  29  29  31    Calcium 8.9 - 10.3 mg/dL 9.1  9.7  9.1   Total Protein 6.5 - 8.1 g/dL 6.8  7.6  6.6   Total Bilirubin 0.3 - 1.2 mg/dL 0.9  0.8  0.5   Alkaline Phos 38 - 126 U/L 81  102  86   AST 15 - 41 U/L 22  25  21    ALT 0 - 44 U/L 14  18  15     . Lab Results  Component Value Date   IRON 259 (H) 11/30/2022   TIBC 267 11/30/2022   IRONPCTSAT 97 (H) 11/30/2022    (Iron and TIBC)  Lab Results  Component Value Date   FERRITIN 97 11/30/2022    Hemochromatosis DNA-PCR(c282y,h63d)  Order: [id number]  Status:  Final result   Visible to patient:  Yes (MyChart) Next appt:  10/06/2017 at 10:45 AM in Oncology [facility]) Dx:  Hereditary hemochromatosis (HCC)  Component 3wk ago  Hemochromatosis Gene Comment   Comment: Result:  AFFECTED  Two copies of the same mutation (C282Y and C282Y) identified         RADIOGRAPHIC STUDIES: I have personally reviewed the radiological images as listed and agreed with the findings in the report. No results found.  ASSESSMENT & PLAN:   89 y.o. female with  #1 history of hemochromatosis . Homozygous C282Y mutation  Patient notes that she has never had a ferritin level more than 1000. Family history positive. Her goal has been to keep her ferritin levels below 50. She has roughly needed a therapeutic phlebotomy every 6 months or so.   #2 T1N0 node negative right breast cancer 1994, post mastectomy with nodes, 5 years of adjuvant tamoxifen. Plan  -Continue yearly mammograms with primary care physician.  PLAN:  -Discussed lab results from today, 11/30/2022, with the patient. CBC and CMP stable, except slightly decreased sodium level. Elevated iron saturation ratio at 97%.  Ferritin level is 97 -will pursue therapaeutic phlebotomy for ferritin>100 - will offer patient therapeutic phlebotomy at this tmie -Recommended patient to stay UTD on vaccinations   FOLLOW UP:  RTC with Dr Candise Che with labs in 6 months Setup for therapeutic phlebotomy in 1 week  The total time spent in the appointment was 20 minutes* .  All of the patient's questions were answered with apparent satisfaction. The patient knows to call the clinic with any problems, questions or concerns.   Wyvonnia Lora MD MS AAHIVMS [facility] Hematology/Oncology Physician [facility]  .*Total Encounter Time as defined by the  Centers for Medicare and Medicaid Services includes, in addition to the face-to-face time of a patient visit (documented in the note above) non-face-to-face time: obtaining and reviewing outside history, ordering and reviewing medications, tests or procedures, care coordination (communications with other health care professionals or caregivers) and documentation in the medical record.   I,Param Shah,acting as a Neurosurgeon for Wyvonnia Lora, MD.,have documented all relevant documentation on the behalf of Wyvonnia Lora, MD,as directed by  Wyvonnia Lora, MD while in the presence of Wyvonnia Lora, MD.   .I have reviewed the above documentation for accuracy and completeness, and I agree with the above. Johney Maine MD

## 2022-12-01 ENCOUNTER — Encounter: Payer: Self-pay | Admitting: Hematology

## 2022-12-01 ENCOUNTER — Ambulatory Visit: Payer: Medicare Other | Admitting: Podiatry

## 2022-12-01 DIAGNOSIS — L820 Inflamed seborrheic keratosis: Secondary | ICD-10-CM | POA: Diagnosis not present

## 2022-12-01 DIAGNOSIS — Z85828 Personal history of other malignant neoplasm of skin: Secondary | ICD-10-CM | POA: Diagnosis not present

## 2022-12-01 DIAGNOSIS — L821 Other seborrheic keratosis: Secondary | ICD-10-CM | POA: Diagnosis not present

## 2022-12-01 DIAGNOSIS — L570 Actinic keratosis: Secondary | ICD-10-CM | POA: Diagnosis not present

## 2022-12-04 ENCOUNTER — Other Ambulatory Visit: Payer: No Typology Code available for payment source

## 2022-12-06 NOTE — Progress Notes (Incomplete)
Brandy Peterson    HEMATOLOGY/ONCOLOGY CLINIC NOTE  Date of Service: 11/30/22    Patient Care Team: Corwin Levins, MD as PCP - General (Internal Medicine) Rollene Rotunda, MD as PCP - Cardiology (Cardiology) Cherlyn Roberts, MD (Dermatology) Carman Ching, MD (Inactive) (Gastroenterology) Marcene Corning, MD as Consulting Physician (Orthopedic Surgery) Johney Maine, MD as Consulting Physician (Hematology)  CHIEF COMPLAINTS/PURPOSE OF CONSULTATION:  Follow-up for continued evaluation and management of hereditary hemochromatosis  HISTORY OF PRESENTING ILLNESS:   Brandy Peterson is a wonderful 89 y.o. female who has been referred to [place] by Dr Darrold Span for ongoing management of hemochromatosis. She notes that she has a family history of hemochromatosis and was diagnosed in the 97s. She has required maintenance therapy to phlebotomies roughly every 6 months to try to keep her ferritin levels below 50. She notes no known history of liver disease or heart disease. She reports that her ferritin levels even on diagnosis but not more than 1000. She does not recollect what her genetic mutation studies showed. Patient today has no acute new concerns. No new skin pigmentation changes. No new fatigue. No abdominal pain or distention. No shortness of breath or DOE.  INTERVAL HISTORY:  Brandy Peterson is here for her scheduled follow-up for hereditary hemochromatosis.  Patient was last seen by me on 04/24/2022 and she complained of worsened urinary incompetence, mild constipation, occasional leg swelling, chronic back pain, mild SOB when exerting by lifting heavy objects.   Patient notes she has been doing fairly well since our last visit. Today, she reports hypertension and other several issues which she does not believe are related to her Hemachromatosis.    Patient complains of right leg edema during this visit. She regularly takes Amlodipine 2.5 mg. Her last phlebotomy was around one year go. She denies  taking iron supplement.   She denies any recent infection issues, fever, chills, night sweats, abdominal pain, back pain, chest pain, or abnormal bowel movement.   MEDICAL HISTORY:  Past Medical History:  Diagnosis Date  . Carcinoma of breast (HCC)    Right  . Complication of anesthesia    was told to never use Sodium Pentothal - mother had reaction. Also had trouble with intubation during mastectomy  . COPD (chronic obstructive pulmonary disease) (HCC)   . Family history of adverse reaction to anesthesia   . GERD (gastroesophageal reflux disease)   . Hemochromatosis   . History of shingles   . Hypertension   . Hypothyroidism   . IBS (irritable bowel syndrome)   . Plantar fasciitis   . Pneumonia   . Raynaud's syndrome   . Spastic colon    hx of   . Thyroid disease   . Varicose veins     SURGICAL HISTORY: Past Surgical History:  Procedure Laterality Date  . ABDOMINAL HYSTERECTOMY  1979   PARTIAL  . APPENDECTOMY    . BONE SPUR  1970   BOTH FEET  . CATARACT EXTRACTION  10/2012 and 12/2012   bilateral with lens implants  . COLONOSCOPY    . KYPHOPLASTY N/A 10/19/2014   Procedure: Lumbar 1 kyphoplasty;  Surgeon: Shirlean Kelly, MD;  Location: [facility];  Service: Neurosurgery;  Laterality: N/A;  . LUMBAR LAMINECTOMY  1991   L5-S1  . MASTECTOMY  1994   RIGHT  . MASTECTOMY  1994   Right    SOCIAL HISTORY: Social History   Socioeconomic History  . Marital status: Widowed    Spouse name: Not on file  .  Number of children: 1  . Years of education: 62  . Highest education level: High school graduate  Occupational History  . Occupation: Retired  Tobacco Use  . Smoking status: Former    Current packs/day: 0.00    Types: Cigarettes    Quit date: 04/13/1988    Years since quitting: 34.6  . Smokeless tobacco: Never  . Tobacco comments:    approximate quit date  Vaping Use  . Vaping status: Never Used  Substance and Sexual Activity  . Alcohol use: Yes     Alcohol/week: 3.0 standard drinks of alcohol    Types: 3 Glasses of wine per week    Comment: 2oz of wine occasionally  . Drug use: No  . Sexual activity: Never  Other Topics Concern  . 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. Pt married in 1953 and was widowed in 2002. She now lives alone I-ADLs. Pt has 1 son born 56 and 2 grandchildren.       Pt had a colonoscopy on 05/09/2012 by Dr Randa Evens.       Pt reports having a pneumo vaccine.      Right-handed.   4-5 cups caffeine daily.   Lives alone.   Social Determinants of Health   Financial Resource Strain: Low Risk  (06/16/2017)   Overall Financial Resource Strain (CARDIA)   . Difficulty of Paying Living Expenses: Not hard at all  Food Insecurity: No Food Insecurity (06/16/2017)   Hunger Vital Sign   . Worried About Programme researcher, broadcasting/film/video in the Last Year: Never true   . Ran Out of Food in the Last Year: Never true  Transportation Needs: No Transportation Needs (06/16/2017)   PRAPARE - Transportation   . Lack of Transportation (Medical): No   . Lack of Transportation (Non-Medical): No  Physical Activity: Inactive (06/16/2017)   Exercise Vital Sign   . Days of Exercise per Week: 0 days   . Minutes of Exercise per Session: 0 min  Stress: No Stress Concern Present (06/16/2017)   [employer] of Occupational Health - Occupational Stress Questionnaire   . Feeling of Stress : Only a little  Social Connections: Unknown (06/16/2017)   Social Connection and Isolation Panel [NHANES]   . Frequency of Communication with Friends and Family: More than three times a week   . Frequency of Social Gatherings with Friends and Family: More than three times a week   . Attends Religious Services: More than 4 times per year   . Active Member of Clubs or Organizations: Not on file   . Attends Banker Meetings: Not on file   . Marital Status: Widowed  Intimate Partner Violence: Not on  file    FAMILY HISTORY: Family History  Problem Relation Age of Onset  . Cancer Mother        metastatic cancer (pancreas vs lung)  . Hypertension Mother   . Osteoarthritis Mother   . Hypothyroidism Mother   . Varicose Veins Mother   . Bleeding Disorder Mother   . Heart disease Father 46       CAD/MI  . Hypertension Father   . Arthritis Father   . Hemochromatosis Father        Possible  . Deep vein thrombosis Father   . Varicose Veins Father   . Heart attack Father   . Heart attack Brother 60  . Hemochromatosis Brother   . Heart disease Brother   .  Hyperlipidemia Son   . Diabetes Neg Hx     ALLERGIES:  is allergic to contrast media [iodinated contrast media], adhesive [tape], and sulfonamide derivatives.  MEDICATIONS:  Current Outpatient Medications  Medication Sig Dispense Refill  . amLODipine (NORVASC) 2.5 MG tablet Take 1 tablet (2.5 mg total) by mouth daily. 90 tablet 3  . ciclopirox (PENLAC) 8 % solution Apply topically at bedtime. Apply over nail and surrounding skin. Apply daily over previous coat. After seven (7) days, may remove with alcohol and continue cycle. 6.6 mL 0  . doxycycline (VIBRA-TABS) 100 MG tablet Take 1 tablet (100 mg total) by mouth 2 (two) times daily. 20 tablet 0  . hydrALAZINE (APRESOLINE) 10 MG tablet Take 1 tablet (10 mg total) by mouth 2 (two) times daily. 180 tablet 3  . levothyroxine (SYNTHROID) 88 MCG tablet Take 1 tablet (88 mcg total) by mouth daily before breakfast. 90 tablet 3  . metoprolol succinate (TOPROL-XL) 25 MG 24 hr tablet Take 1 tablet (25 mg total) by mouth daily. 90 tablet 3  . omeprazole (PRILOSEC) 40 MG capsule Take 1 capsule (40 mg total) by mouth daily. 90 capsule 3  . triamterene-hydrochlorothiazide (MAXZIDE-25) 37.5-25 MG tablet Take 1 tablet by mouth daily. 90 tablet 3   No current facility-administered medications for this visit.    REVIEW OF SYSTEMS:    10 Point review of Systems was done is negative except as  noted above.   PHYSICAL EXAMINATION: ECOG PERFORMANCE STATUS: 1 - Symptomatic but completely ambulatory . Vitals:   11/30/22 1345  BP: (!) 159/68  Pulse: 66  Resp: 18  Temp: (!) 97.5 F (36.4 C)  SpO2: 98%     Filed Weights   11/30/22 1345  Weight: 110 lb 9.6 oz (50.2 kg)    .Body mass index is 20.9 kg/m.  GENERAL:alert, in no acute distress and comfortable SKIN: no acute rashes, no significant lesions EYES: conjunctiva are pink and non-injected, sclera anicteric OROPHARYNX: MMM, no exudates, no oropharyngeal erythema or ulceration NECK: supple, no JVD LYMPH:  no palpable lymphadenopathy in the cervical, axillary or inguinal regions LUNGS: clear to auscultation b/l with normal respiratory effort HEART: regular rate & rhythm ABDOMEN:  normoactive bowel sounds , non tender, not distended. Extremity: no pedal edema PSYCH: alert & oriented x 3 with fluent speech NEURO: no focal motor/sensory deficits    LABORATORY DATA:  I have reviewed the data as listed  .    Latest Ref Rng & Units 11/30/2022   12:27 PM 08/20/2022   10:34 AM 04/24/2022    1:21 PM  CBC  WBC 4.0 - 10.5 K/uL 6.6  8.3  7.7   Hemoglobin 12.0 - 15.0 g/dL 16.1  09.6  04.5   Hematocrit 36.0 - 46.0 % 39.8  45.4  42.3   Platelets 150 - 400 K/uL 233  288.0  258     .    Latest Ref Rng & Units 11/30/2022   12:27 PM 08/20/2022   10:34 AM 06/25/2021    2:54 PM  CMP  Glucose 70 - 99 mg/dL 95  93  89   BUN 8 - 23 mg/dL 15  18  16    Creatinine 0.44 - 1.00 mg/dL 4.09  8.11  9.14   Sodium 135 - 145 mmol/L 132  132  134   Potassium 3.5 - 5.1 mmol/L 4.3  4.4  4.3   Chloride 98 - 111 mmol/L 96  94  98   CO2 22 - 32 mmol/L  29  29  31    Calcium 8.9 - 10.3 mg/dL 9.1  9.7  9.1   Total Protein 6.5 - 8.1 g/dL 6.8  7.6  6.6   Total Bilirubin 0.3 - 1.2 mg/dL 0.9  0.8  0.5   Alkaline Phos 38 - 126 U/L 81  102  86   AST 15 - 41 U/L 22  25  21    ALT 0 - 44 U/L 14  18  15     . Lab Results  Component Value Date    IRON 259 (H) 11/30/2022   TIBC 267 11/30/2022   IRONPCTSAT 97 (H) 11/30/2022   (Iron and TIBC)  Lab Results  Component Value Date   FERRITIN 97 11/30/2022    Hemochromatosis DNA-PCR(c282y,h63d)  Order: [id number]  Status:  Final result   Visible to patient:  Yes (MyChart) Next appt:  10/06/2017 at 10:45 AM in Oncology [facility] 1) Dx:  Hereditary hemochromatosis (HCC)  Component 3wk ago  Hemochromatosis Gene Comment   Comment: Result:  AFFECTED  Two copies of the same mutation (C282Y and C282Y) identified         RADIOGRAPHIC STUDIES: I have personally reviewed the radiological images as listed and agreed with the findings in the report. No results found.  ASSESSMENT & PLAN:   89 y.o. female with  #1 history of hemochromatosis . Homozygous C282Y mutation  Patient notes that she has never had a ferritin level more than 1000. Family history positive. Her goal has been to keep her ferritin levels below 50. She has roughly needed a therapeutic phlebotomy every 6 months or so.   #2 T1N0 node negative right breast cancer 1994, post mastectomy with nodes, 5 years of adjuvant tamoxifen. Plan  -Continue yearly mammograms with primary care physician.  PLAN:  -Discussed lab results from today, 11/30/2022, with the patient. CBC and CMP stable, except slightly decreased sodium level. Elevated iron saturation ratio at 97%. Ferritin level pending.  -will pursue therapaeutic phlebotomy for ferritin>100 - no indication for therapeutic phlebotomy at this tmie -Recommended patient to stay UTD on vaccinations   FOLLOW UP:  ***  The total time spent in the appointment was *** minutes* .  All of the patient's questions were answered with apparent satisfaction. The patient knows to call the clinic with any problems, questions or concerns.   Wyvonnia Lora MD MS AAHIVMS [facility] Hematology/Oncology Physician [facility]  .*Total Encounter Time as defined by the  Centers for Medicare and Medicaid Services includes, in addition to the face-to-face time of a patient visit (documented in the note above) non-face-to-face time: obtaining and reviewing outside history, ordering and reviewing medications, tests or procedures, care coordination (communications with other health care professionals or caregivers) and documentation in the medical record.   I,Param Shah,acting as a Neurosurgeon for Wyvonnia Lora, MD.,have documented all relevant documentation on the behalf of Wyvonnia Lora, MD,as directed by  Wyvonnia Lora, MD while in the presence of Wyvonnia Lora, MD.

## 2022-12-07 ENCOUNTER — Other Ambulatory Visit: Payer: Medicare Other

## 2022-12-07 ENCOUNTER — Encounter: Payer: Self-pay | Admitting: Hematology

## 2022-12-07 ENCOUNTER — Ambulatory Visit: Payer: Medicare Other | Admitting: Hematology

## 2022-12-15 NOTE — Progress Notes (Deleted)
Cardiology Office Note:    Date:  12/15/2022   ID:  Brandy Peterson, DOB 1930-12-02, MRN [id number]  PCP:  Corwin Levins, MD  Cardiologist:  Rollene Rotunda, MD  Electrophysiologist:  None   Referring MD: Corwin Levins, MD   Chief Complaint: follow-up of dyspnea on exertion  History of Present Illness:    Brandy Peterson is a 89 y.o. female with a history of coronary artery calcifications noted on prior CT scan but negative cardiac PET in 02/2022, COPD, hypertension, hyperlipidemia, varicose veins, hypothyroidism, GERD, IBS, and hemochromatosis who is followed by Dr. Antoine Poche and presents today for routine follow-up.  Patient was referred to Dr Antoine Poche in 02/2022 for further evaluation of coronary calcifications noted on a CT scan. Prior stress test in 2018 was negative. At that initial visit, she reported dyspnea with exertion and some associated "dull aching" chest discomfort when she gets particularly short of breath. She also described occasionally feelings of "almost fainting" a few times a month but no overt syncope. BNP was minimally elevated in the 130s. Cardiac PET was ordered and was low risk with no evidence of ischemia. Echo showed LVEF of 60-65% with normal wall motion, mild asymmetric LVH of the basal-septal segment, and grade 2 diastolic dysfunction as well as mild MR and aortic calcifications/ sclerosis but no AS.  She was last seen by Dr. Antoine Poche in 08/2022 at which time she continued to report some mild dyspnea on exertion but was otherwise doing well. She was no longer complaining of any near syncope.  Dyspnea was felt to possible be due to COPD. No additional cardiac work-up was felt to be necessary.   Patient presents today for follow-up. ***  Coronary Calcifications Aortic Atherosclerosis Coronary and aortic atherosclerosis have been noted on chest CTs dating back to at least 2020. Cardiac PET in 02/2022 was low risk with no evidence of ischemia. - No chest pain.  - She  does not appear to be on aspirin or a statin. *** LDL  Dyspnea on Exertion Cardiac work-up has been unremarkable. Cardiac PET in 02/2022 was low risk with no evidence of ischemia. Echo in 03/2022 showed 60-65% with normal wall motion, mild asymmetric LVH of the basal-septal segment, and grade 2 diastolic dysfunction as well as mild MR and aortic calcifications/ sclerosis but no AS. Felt to possible be related to COPD. - Stable  - No additional cardiac work-up necessary at this time.  Hypertension BP *** - Continue current medications: Amlodipine 2.5mg  twice daily, Toprol-XL 25mg  daily, and Hydralazine 10mg  twice daily. *** Triamterene-HCTZ*** - Of note, higher doses of Amlodipine have caused worsening lower extremity edema in the past. ***  Hyperlipidemia Lipid panel in 08/2022: Total Cholesterol 234, Triglycerides 86, HDL 89, LDL 127.  - ***  EKGs/Labs/Other Studies Reviewed:    The following studies were reviewed:  Cardiac PET 03/10/2022:   Subtle baseline ST depressions worsened during stress   LV perfusion is normal. There is no evidence of ischemia. There is no evidence of infarction.   Rest left ventricular function is normal. Stress left ventricular function is normal. End diastolic cavity size is normal. End systolic cavity size is normal.   Myocardial blood flow was computed to be 1.71ml/g/min at rest and 2.80ml/g/min at stress. Global myocardial blood flow reserve was 1.61 and was mildly abnormal.   Coronary calcium was present on the attenuation correction CT images. Moderate coronary calcifications were present. Coronary calcifications were present in the left anterior descending artery  and right coronary artery distribution(s).   The study is normal. The study is low risk.   Moderate coronary calcifications on CT, and TID is present, which can be seen in severe multivessel CAD.  However, LV perfusion is normal.  In addition, absolute stress myocardial blood flows are normal  globally and in each coronary distribution (flow reserves are decreased, however this is due to high resting flows).  Also, there is appropriate increase in LVEF with stress, which argues against multivessel CAD.  Overall, would classify study as low risk. _______________  Echocardiogram 04/09/2022: Impressions: 1. Left ventricular ejection fraction, by estimation, is 60 to 65%. Left  ventricular ejection fraction by 3D volume is 64 %. The left ventricle has  normal function. The left ventricle has no regional wall motion  abnormalities. There is mild asymmetric  left ventricular hypertrophy of the basal-septal segment. Left ventricular  diastolic parameters are consistent with Grade II diastolic dysfunction  (pseudonormalization).   2. Right ventricular systolic function is normal. The right ventricular  size is normal. There is normal pulmonary artery systolic pressure. The  estimated right ventricular systolic pressure is 35.7 mmHg.   3. Left atrial size was mildly dilated.   4. The mitral valve is degenerative. Mild mitral valve regurgitation.  Moderate mitral annular calcification.   5. The aortic valve is tricuspid. There is mild calcification of the  aortic valve. There is mild thickening of the aortic valve. Aortic valve  regurgitation is not visualized. Aortic valve sclerosis/calcification is  present, without any evidence of  aortic stenosis.   6. The inferior vena cava is normal in size with greater than 50%  respiratory variability, suggesting right atrial pressure of 3 mmHg.    EKG:  EKG not ordered today.   Recent Labs: 02/13/2022: BNP 138.6 08/20/2022: TSH 5.49 11/30/2022: ALT 14; BUN 15; Creatinine 0.73; Hemoglobin 14.0; Platelet Count 233; Potassium 4.3; Sodium 132  Recent Lipid Panel    Component Value Date/Time   CHOL 234 (H) 08/20/2022 1034   TRIG 86.0 08/20/2022 1034   HDL 89.60 08/20/2022 1034   CHOLHDL 3 08/20/2022 1034   VLDL 17.2 08/20/2022 1034   LDLCALC  127 (H) 08/20/2022 1034   LDLDIRECT 93.6 12/26/2010 1709    Physical Exam:    Vital Signs: There were no vitals taken for this visit.    Wt Readings from Last 3 Encounters:  11/30/22 110 lb 9.6 oz (50.2 kg)  09/01/22 110 lb 6.4 oz (50.1 kg)  08/20/22 111 lb (50.3 kg)     General: 89 y.o. female in no acute distress. HEENT: Normocephalic and atraumatic. Sclera clear. EOMs intact. Neck: Supple. No carotid bruits. No JVD. Heart: *** RRR. Distinct S1 and S2. No murmurs, gallops, or rubs. Radial and distal pedal pulses 2+ and equal bilaterally. Lungs: No increased work of breathing. Clear to ausculation bilaterally. No wheezes, rhonchi, or rales.  Abdomen: Soft, non-distended, and non-tender to palpation. Bowel sounds present in all 4 quadrants.  MSK: Normal strength and tone for age. *** Extremities: No lower extremity edema.    Skin: Warm and dry. Neuro: Alert and oriented x3. No focal deficits. Psych: Normal affect. Responds appropriately.   Assessment:    No diagnosis found.  Plan:     Disposition: Follow up in ***   Medication Adjustments/Labs and Tests Ordered: Current medicines are reviewed at length with the patient today.  Concerns regarding medicines are outlined above.  No orders of the defined types were placed in this encounter.  No orders of the defined types were placed in this encounter.   There are no Patient Instructions on file for this visit.   Signed, Corrin Parker, PA-C  12/15/2022 4:45 PM    [facility]

## 2022-12-22 ENCOUNTER — Ambulatory Visit: Payer: Medicare Other | Admitting: Student

## 2022-12-23 ENCOUNTER — Other Ambulatory Visit: Payer: Self-pay

## 2022-12-24 ENCOUNTER — Inpatient Hospital Stay: Payer: Medicare Other | Attending: Hematology

## 2022-12-24 LAB — IRON AND IRON BINDING CAPACITY (CC-WL,HP ONLY)
Iron: 232 ug/dL — ABNORMAL HIGH (ref 28–170)
Saturation Ratios: 77 % — ABNORMAL HIGH (ref 10.4–31.8)
TIBC: 301 ug/dL (ref 250–450)
UIBC: 69 ug/dL — ABNORMAL LOW (ref 148–442)

## 2022-12-24 LAB — FERRITIN: Ferritin: 89 ng/mL (ref 11–307)

## 2022-12-28 ENCOUNTER — Other Ambulatory Visit: Payer: Medicare Other

## 2022-12-29 ENCOUNTER — Other Ambulatory Visit: Payer: Self-pay | Admitting: Hematology

## 2022-12-30 ENCOUNTER — Inpatient Hospital Stay: Payer: Medicare Other

## 2022-12-30 NOTE — Progress Notes (Signed)
Per Candise Che, MD, okay to proceed with Phlebotomy with Ferritin less than 100. Goal Ferritin less than 50.   Brandy Peterson presents today for phlebotomy per MD orders. Phlebotomy procedure started at 1430 and ended at 1443. 325 grams removed before clotting off. Pt declined to be stuck x2. Patient observed for 30 minutes after procedure without any incident. Patient tolerated procedure well. IV needle removed intact.

## 2023-01-01 ENCOUNTER — Ambulatory Visit (INDEPENDENT_AMBULATORY_CARE_PROVIDER_SITE_OTHER): Payer: Medicare Other

## 2023-01-01 DIAGNOSIS — B351 Tinea unguium: Secondary | ICD-10-CM

## 2023-01-01 NOTE — Progress Notes (Unsigned)
Patient presents today for the 2nd laser treatment. Diagnosed with mycotic nail infection by Dr. Ralene Cork.   Toenail most affected bilateral 1st toenails.  All other systems are negative.  Nails were trimmed and filed thin. Laser therapy was administered to the first toenails bilaterally and patient tolerated the treatment well. All safety precautions were in place.   Single laser pass was done on non-affected nails.   Follow up in 4 weeks for laser # 3.

## 2023-01-01 NOTE — Patient Instructions (Signed)

## 2023-01-09 DIAGNOSIS — Z23 Encounter for immunization: Secondary | ICD-10-CM | POA: Diagnosis not present

## 2023-01-12 DIAGNOSIS — R102 Pelvic and perineal pain: Secondary | ICD-10-CM | POA: Diagnosis not present

## 2023-01-12 DIAGNOSIS — N3941 Urge incontinence: Secondary | ICD-10-CM | POA: Diagnosis not present

## 2023-02-05 DIAGNOSIS — Z23 Encounter for immunization: Secondary | ICD-10-CM | POA: Diagnosis not present

## 2023-02-22 ENCOUNTER — Ambulatory Visit (INDEPENDENT_AMBULATORY_CARE_PROVIDER_SITE_OTHER): Payer: Medicare Other | Admitting: Internal Medicine

## 2023-02-22 ENCOUNTER — Encounter: Payer: Self-pay | Admitting: Internal Medicine

## 2023-02-22 VITALS — BP 160/78 | HR 62 | Temp 97.8°F | Ht 61.0 in | Wt 110.4 lb

## 2023-02-22 DIAGNOSIS — R739 Hyperglycemia, unspecified: Secondary | ICD-10-CM | POA: Diagnosis not present

## 2023-02-22 DIAGNOSIS — I10 Essential (primary) hypertension: Secondary | ICD-10-CM

## 2023-02-22 DIAGNOSIS — E559 Vitamin D deficiency, unspecified: Secondary | ICD-10-CM | POA: Diagnosis not present

## 2023-02-22 DIAGNOSIS — R109 Unspecified abdominal pain: Secondary | ICD-10-CM

## 2023-02-22 DIAGNOSIS — E7800 Pure hypercholesterolemia, unspecified: Secondary | ICD-10-CM | POA: Diagnosis not present

## 2023-02-22 DIAGNOSIS — R10A2 Flank pain, left side: Secondary | ICD-10-CM

## 2023-02-22 MED ORDER — HYDROCODONE-ACETAMINOPHEN 5-325 MG PO TABS: ORAL_TABLET | ORAL | 0 refills | Status: DC

## 2023-02-22 NOTE — Progress Notes (Signed)
Patient ID: Brandy Peterson, female   DOB: 09-07-1930, 89 y.o.   MRN: [id number]        Chief Complaint: follow up HTN, chronic pain left flank unknown etiology, hld, hyperglycemia, low vit d       HPI:  Brandy Peterson is a 89 y.o. female here not taking the amlodipine due to leg swelling even at the 2.,5 mg dose, also not taking hydralazine as believes it makes her have blurry vision, though still takes once in the am only.  Takes dyazide same for many years.  Also takes the toprol xl 25 mg with good compliance though she has read it is on the "bad list" she read about.  Meds have been per cardiology, and "getting tired of going to doctors" and not sure she is going back.  No significant memory changes, just getting tired of the "system" with mult doctors and therapists. Pt denies chest pain, increased sob or doe, wheezing, orthopnea, PND, increased LE swelling, palpitations, dizziness or syncope.   Pt denies polydipsia, polyuria, or new focal neuro s/s.    Pt denies fever, wt loss, night sweats, loss of appetite, or other constitutional symptoms        Wt Readings from Last 3 Encounters:  02/22/23 110 lb 6.4 oz (50.1 kg)  11/30/22 110 lb 9.6 oz (50.2 kg)  09/01/22 110 lb 6.4 oz (50.1 kg)   BP Readings from Last 3 Encounters:  02/22/23 (!) 160/78  12/30/22 (!) 155/57  11/30/22 (!) 159/68         Past Medical History:  Diagnosis Date   Carcinoma of breast (HCC)    Right   Complication of anesthesia    was told to never use Sodium Pentothal - mother had reaction. Also had trouble with intubation during mastectomy   COPD (chronic obstructive pulmonary disease) (HCC)    Family history of adverse reaction to anesthesia    GERD (gastroesophageal reflux disease)    Hemochromatosis    History of shingles    Hypertension    Hypothyroidism    IBS (irritable bowel syndrome)    Plantar fasciitis    Pneumonia    Raynaud's syndrome    Spastic colon    hx of    Thyroid disease    Varicose veins     Past Surgical History:  Procedure Laterality Date   ABDOMINAL HYSTERECTOMY  1979   PARTIAL   APPENDECTOMY     BONE SPUR  1970   BOTH FEET   CATARACT EXTRACTION  10/2012 and 12/2012   bilateral with lens implants   COLONOSCOPY     KYPHOPLASTY N/A 10/19/2014   Procedure: Lumbar 1 kyphoplasty;  Surgeon: Shirlean Kelly, MD;  Location: [facility];  Service: Neurosurgery;  Laterality: N/A;   LUMBAR LAMINECTOMY  1991   L5-S1   MASTECTOMY  1994   RIGHT   MASTECTOMY  1994   Right    reports that she quit smoking about 34 years ago. Her smoking use included cigarettes. She has never used smokeless tobacco. She reports current alcohol use of about 3.0 standard drinks of alcohol per week. She reports that she does not use drugs. family history includes Arthritis in her father; Bleeding Disorder in her mother; Cancer in her mother; Deep vein thrombosis in her father; Heart attack in her father; Heart attack (age of onset: 31) in her brother; Heart disease in her brother; Heart disease (age of onset: 12) in her father; Hemochromatosis in her brother and  father; Hyperlipidemia in her son; Hypertension in her father and mother; Hypothyroidism in her mother; Osteoarthritis in her mother; Varicose Veins in her father and mother. Allergies  Allergen Reactions   Contrast Media [Iodinated Contrast Media] Hives, Itching and Other (See Comments)    Felt like she was going to faint    Adhesive [Tape] Other (See Comments)    Prefers paper tape   Sulfonamide Derivatives Other (See Comments)    Reaction: unknown    Current Outpatient Medications on File Prior to Visit  Medication Sig Dispense Refill   hydrALAZINE (APRESOLINE) 10 MG tablet Take 1 tablet (10 mg total) by mouth 2 (two) times daily. 180 tablet 3   levothyroxine (SYNTHROID) 88 MCG tablet Take 1 tablet (88 mcg total) by mouth daily before breakfast. 90 tablet 3   metoprolol succinate (TOPROL-XL) 25 MG 24 hr tablet Take 1 tablet (25 mg total) by  mouth daily. 90 tablet 3   omeprazole (PRILOSEC) 40 MG capsule Take 1 capsule (40 mg total) by mouth daily. 90 capsule 3   triamterene-hydrochlorothiazide (MAXZIDE-25) 37.5-25 MG tablet Take 1 tablet by mouth daily. 90 tablet 3   No current facility-administered medications on file prior to visit.        ROS:  All others reviewed and negative.  Objective        PE:  BP (!) 160/78   Pulse 62   Temp 97.8 F (36.6 C) (Oral)   Ht 5\' 1"  (1.549 m)   Wt 110 lb 6.4 oz (50.1 kg)   SpO2 99%   BMI 20.86 kg/m                 Constitutional: Pt appears in NAD               HENT: Head: NCAT.                Right Ear: External ear normal.                 Left Ear: External ear normal.                Eyes: . Pupils are equal, round, and reactive to light. Conjunctivae and EOM are normal               Nose: without d/c or deformity               Neck: Neck supple. Gross normal ROM               Cardiovascular: Normal rate and regular rhythm.                 Pulmonary/Chest: Effort normal and breath sounds without rales or wheezing.                Abd:  Soft, NT, ND, + BS, no organomegaly               Neurological: Pt is alert. At baseline orientation, motor grossly intact               Skin: Skin is warm. No rashes, no other new lesions, LE edema - none               Psychiatric: Pt behavior is normal without agitation   Micro: none  Cardiac tracings I have personally interpreted today:  none  Pertinent Radiological findings (summarize): none   Lab Results  Component Value Date   WBC 6.6 11/30/2022   HGB 14.0  11/30/2022   HCT 39.8 11/30/2022   PLT 233 11/30/2022   GLUCOSE 95 11/30/2022   CHOL 234 (H) 08/20/2022   TRIG 86.0 08/20/2022   HDL 89.60 08/20/2022   LDLDIRECT 93.6 12/26/2010   LDLCALC 127 (H) 08/20/2022   ALT 14 11/30/2022   AST 22 11/30/2022   NA 132 (L) 11/30/2022   K 4.3 11/30/2022   CL 96 (L) 11/30/2022   CREATININE 0.73 11/30/2022   BUN 15 11/30/2022   CO2 29  11/30/2022   TSH 5.49 08/20/2022   HGBA1C 5.2 08/20/2022   Assessment/Plan:  Brandy Peterson is a 89 y.o. White or Caucasian [1] female with  has a past medical history of Carcinoma of breast (HCC), Complication of anesthesia, COPD (chronic obstructive pulmonary disease) (HCC), Family history of adverse reaction to anesthesia, GERD (gastroesophageal reflux disease), Hemochromatosis, History of shingles, Hypertension, Hypothyroidism, IBS (irritable bowel syndrome), Plantar fasciitis, Pneumonia, Raynaud's syndrome, Spastic colon, Thyroid disease, and Varicose veins.  Vitamin D deficiency Last vitamin D Lab Results  Component Value Date   VD25OH 41.73 08/20/2022   Stable, cont oral replacement   Left flank pain Chronic stable but severe and causes her tears at times, has had extensive eval and not better with tramadol or gabapentin - ok for limited hydrocodone prn  Hyperglycemia Lab Results  Component Value Date   HGBA1C 5.2 08/20/2022   Stable, pt to continue current medical treatment  - diet, wt control   HLD (hyperlipidemia) Lab Results  Component Value Date   LDLCALC 127 (H) 08/20/2022   uncontrolled, pt for lower chol diet, declines statin or other for now   Essential hypertension BP Readings from Last 3 Encounters:  02/22/23 (!) 160/78  12/30/22 (!) 155/57  11/30/22 (!) 159/68   Uncontrolled, pt has perceived side effects and not taking amlodipine, and only hydralazine 10 qam, pt to continue dyazide 1 every day and toprol xl 25 every day, declines other change today   Followup: Return in about 6 months (around 08/22/2023).  Oliver Barre, MD 02/22/2023 1:16 PM [facility]

## 2023-02-22 NOTE — Assessment & Plan Note (Signed)
Last vitamin D Lab Results  Component Value Date   VD25OH 41.73 08/20/2022   Stable, cont oral replacement

## 2023-02-22 NOTE — Patient Instructions (Signed)
Please take all new medication as prescribed - the pain medication if needed  Please continue all other medications as before, and refills have been done if requested.  Please have the pharmacy call with any other refills you may need.  Please continue your efforts at being more active, low cholesterol diet, and weight control.  Please keep your appointments with your specialists as you may have planned  Please make an Appointment to return in 6 months, or sooner if needed

## 2023-02-22 NOTE — Assessment & Plan Note (Signed)
Chronic stable but severe and causes her tears at times, has had extensive eval and not better with tramadol or gabapentin - ok for limited hydrocodone prn

## 2023-02-22 NOTE — Assessment & Plan Note (Signed)
BP Readings from Last 3 Encounters:  02/22/23 (!) 160/78  12/30/22 (!) 155/57  11/30/22 (!) 159/68   Uncontrolled, pt has perceived side effects and not taking amlodipine, and only hydralazine 10 qam, pt to continue dyazide 1 every day and toprol xl 25 every day, declines other change today

## 2023-02-22 NOTE — Assessment & Plan Note (Signed)
Lab Results  Component Value Date   LDLCALC 127 (H) 08/20/2022   uncontrolled, pt for lower chol diet, declines statin or other for now

## 2023-02-22 NOTE — Assessment & Plan Note (Signed)
Lab Results  Component Value Date   HGBA1C 5.2 08/20/2022   Stable, pt to continue current medical treatment  - diet, wt control

## 2023-02-26 ENCOUNTER — Ambulatory Visit (INDEPENDENT_AMBULATORY_CARE_PROVIDER_SITE_OTHER): Payer: Medicare Other | Admitting: *Deleted

## 2023-02-26 DIAGNOSIS — B351 Tinea unguium: Secondary | ICD-10-CM

## 2023-02-26 NOTE — Progress Notes (Signed)
Patient presents today for the 3rd laser treatment. Diagnosed with mycotic nail infection by Dr. Ralene Cork.   Toenail most affected bilateral 1st toenails. The left one is completely grown out and clear, the right is more that 3/4 improved.  All other systems are negative.  Nails were trimmed and filed thin. Laser therapy was administered to the 1st toenails bilaterally and patient tolerated the treatment well. All safety precautions were in place.    Follow up in 6 weeks for laser # 4

## 2023-03-24 ENCOUNTER — Encounter: Payer: Self-pay | Admitting: Internal Medicine

## 2023-03-25 MED ORDER — FLUTICASONE PROPIONATE 50 MCG/ACT NA SUSP: 2.0000 | Freq: Every day | NASAL | 6 refills | Status: DC

## 2023-04-22 ENCOUNTER — Telehealth: Payer: Self-pay | Admitting: Hematology

## 2023-04-22 NOTE — Telephone Encounter (Signed)
 Rescheduled appointments per provider on call. Patient was made aware of changes via voicemail and will be mailed an appointment reminder.

## 2023-04-28 ENCOUNTER — Ambulatory Visit: Payer: Medicare Other | Admitting: Emergency Medicine

## 2023-04-30 ENCOUNTER — Other Ambulatory Visit: Payer: Medicare Other

## 2023-05-24 ENCOUNTER — Ambulatory Visit: Payer: Medicare Other | Admitting: Hematology

## 2023-05-24 ENCOUNTER — Other Ambulatory Visit: Payer: Medicare Other

## 2023-06-08 ENCOUNTER — Other Ambulatory Visit: Payer: Self-pay

## 2023-06-08 ENCOUNTER — Inpatient Hospital Stay (HOSPITAL_BASED_OUTPATIENT_CLINIC_OR_DEPARTMENT_OTHER): Payer: Medicare Other | Admitting: Hematology

## 2023-06-08 ENCOUNTER — Inpatient Hospital Stay: Payer: Medicare Other | Attending: Hematology

## 2023-06-08 DIAGNOSIS — Z87891 Personal history of nicotine dependence: Secondary | ICD-10-CM | POA: Diagnosis not present

## 2023-06-08 DIAGNOSIS — Z853 Personal history of malignant neoplasm of breast: Secondary | ICD-10-CM | POA: Insufficient documentation

## 2023-06-08 DIAGNOSIS — Z9011 Acquired absence of right breast and nipple: Secondary | ICD-10-CM | POA: Insufficient documentation

## 2023-06-08 LAB — CBC WITH DIFFERENTIAL (CANCER CENTER ONLY)
Abs Immature Granulocytes: 0.07 10*3/uL (ref 0.00–0.07)
Basophils Absolute: 0 10*3/uL (ref 0.0–0.1)
Basophils Relative: 0 %
Eosinophils Absolute: 0.2 10*3/uL (ref 0.0–0.5)
Eosinophils Relative: 2 %
HCT: 42.7 % (ref 36.0–46.0)
Hemoglobin: 14.8 g/dL (ref 12.0–15.0)
Immature Granulocytes: 1 %
Lymphocytes Relative: 14 %
Lymphs Abs: 1.3 10*3/uL (ref 0.7–4.0)
MCH: 32.7 pg (ref 26.0–34.0)
MCHC: 34.7 g/dL (ref 30.0–36.0)
MCV: 94.3 fL (ref 80.0–100.0)
Monocytes Absolute: 0.8 10*3/uL (ref 0.1–1.0)
Monocytes Relative: 8 %
Neutro Abs: 7 10*3/uL (ref 1.7–7.7)
Neutrophils Relative %: 75 %
Platelet Count: 226 10*3/uL (ref 150–400)
RBC: 4.53 MIL/uL (ref 3.87–5.11)
RDW: 13.2 % (ref 11.5–15.5)
WBC Count: 9.3 10*3/uL (ref 4.0–10.5)
nRBC: 0 % (ref 0.0–0.2)

## 2023-06-08 LAB — CMP (CANCER CENTER ONLY)
ALT: 20 U/L (ref 0–44)
AST: 27 U/L (ref 15–41)
Albumin: 4.1 g/dL (ref 3.5–5.0)
Alkaline Phosphatase: 94 U/L (ref 38–126)
Anion gap: 6 (ref 5–15)
BUN: 16 mg/dL (ref 8–23)
CO2: 31 mmol/L (ref 22–32)
Calcium: 9.5 mg/dL (ref 8.9–10.3)
Chloride: 98 mmol/L (ref 98–111)
Creatinine: 0.74 mg/dL (ref 0.44–1.00)
GFR, Estimated: >60 mL/min (ref >60)
Glucose, Bld: 73 mg/dL (ref 70–99)
Potassium: 4.4 mmol/L (ref 3.5–5.1)
Sodium: 135 mmol/L (ref 135–145)
Total Bilirubin: 0.7 mg/dL (ref 0.0–1.2)
Total Protein: 7 g/dL (ref 6.5–8.1)

## 2023-06-08 LAB — IRON AND IRON BINDING CAPACITY (CC-WL,HP ONLY)
Iron: 225 ug/dL — ABNORMAL HIGH (ref 28–170)
Saturation Ratios: 70 % — ABNORMAL HIGH (ref 10.4–31.8)
TIBC: 322 ug/dL (ref 250–450)
UIBC: 97 ug/dL — ABNORMAL LOW (ref 148–442)

## 2023-06-08 LAB — FERRITIN: Ferritin: 91 ng/mL (ref 11–307)

## 2023-06-08 NOTE — Progress Notes (Signed)
 [facility]    HEMATOLOGY/ONCOLOGY CLINIC NOTE  Date of Service: 06/08/23    Patient Care Team: Corwin Levins, MD as PCP - General (Internal Medicine) Rollene Rotunda, MD as PCP - Cardiology (Cardiology) Cherlyn Roberts, MD (Dermatology) Carman Ching, MD (Inactive) (Gastroenterology) Marcene Corning, MD as Consulting Physician (Orthopedic Surgery) Johney Maine, MD as Consulting Physician (Hematology)  CHIEF COMPLAINTS/PURPOSE OF CONSULTATION:  Follow-up for continued evaluation and management of hereditary hemochromatosis  HISTORY OF PRESENTING ILLNESS:   Brandy Peterson is a wonderful 89 y.o. female who has been referred to [place] by Dr Darrold Span for ongoing management of hemochromatosis. She notes that she has a family history of hemochromatosis and was diagnosed in the 76s. She has required maintenance therapy to phlebotomies roughly every 6 months to try to keep her ferritin levels below 50. She notes no known history of liver disease or heart disease. She reports that her ferritin levels even on diagnosis but not more than 1000. She does not recollect what her genetic mutation studies showed. Patient today has no acute new concerns. No new skin pigmentation changes. No new fatigue. No abdominal pain or distention. No shortness of breath or DOE.  INTERVAL HISTORY:  Brandy Peterson is here for her scheduled follow-up for hereditary hemochromatosis.  Patient was last seen by me on 11/30/2022 and she complained of right leg edema, but was doing well overall.   Patient is unaccompanied during this visit. Patient notes she has been doing fairly well since our last visit. She complains of urinary inconsistency and chronic back pain, mostly left lower back pain. She denies dysuria, urinary infection, or hematuria.   Patient notes that her blood pressure stays elevated at home. Her blood pressure during this visit is 170/56 with pulse rate of 61.   She notes that her last phlebotomies went  well without any new or severe toxicities. She does report that she passed out at one of her last phlebotomy.   She denies any new infection issues, fever, chills, night sweats, unexpected weight loss, chest pain, abdominal pain, or leg swelling.   MEDICAL HISTORY:  Past Medical History:  Diagnosis Date   Carcinoma of breast (HCC)    Right   Complication of anesthesia    was told to never use Sodium Pentothal - mother had reaction. Also had trouble with intubation during mastectomy   COPD (chronic obstructive pulmonary disease) (HCC)    Family history of adverse reaction to anesthesia    GERD (gastroesophageal reflux disease)    Hemochromatosis    History of shingles    Hypertension    Hypothyroidism    IBS (irritable bowel syndrome)    Plantar fasciitis    Pneumonia    Raynaud's syndrome    Spastic colon    hx of    Thyroid disease    Varicose veins     SURGICAL HISTORY: Past Surgical History:  Procedure Laterality Date   ABDOMINAL HYSTERECTOMY  1979   PARTIAL   APPENDECTOMY     BONE SPUR  1970   BOTH FEET   CATARACT EXTRACTION  10/2012 and 12/2012   bilateral with lens implants   COLONOSCOPY     KYPHOPLASTY N/A 10/19/2014   Procedure: Lumbar 1 kyphoplasty;  Surgeon: Shirlean Kelly, MD;  Location: [facility];  Service: Neurosurgery;  Laterality: N/A;   LUMBAR LAMINECTOMY  1991   L5-S1   MASTECTOMY  1994   RIGHT   MASTECTOMY  1994   Right    SOCIAL  HISTORY: Social History   Socioeconomic History   Marital status: Widowed    Spouse name: Not on file   Number of children: 1   Years of education: 58   Highest education level: 12th grade  Occupational History   Occupation: Retired  Tobacco Use   Smoking status: Former    Current packs/day: 0.00    Types: Cigarettes    Quit date: 04/13/1988    Years since quitting: 35.1   Smokeless tobacco: Never   Tobacco comments:    approximate quit date  Vaping Use   Vaping status: Never Used  Substance and Sexual  Activity   Alcohol use: Yes    Alcohol/week: 3.0 standard drinks of alcohol    Types: 3 Glasses of wine per week    Comment: 2oz of wine occasionally   Drug use: No   Sexual activity: Never  Other Topics Concern   Not on file  Social History Narrative   Engineer, agricultural, 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. Pt married in 1953 and was widowed in 2002. She now lives alone I-ADLs. Pt has 1 son born 10 and 2 grandchildren.       Pt had a colonoscopy on 05/09/2012 by Dr Randa Evens.       Pt reports having a pneumo vaccine.      Right-handed.   4-5 cups caffeine daily.   Lives alone.   Social Drivers of Corporate investment banker Strain: Low Risk  (02/18/2023)   Overall Financial Resource Strain (CARDIA)    Difficulty of Paying Living Expenses: Not hard at all  Food Insecurity: No Food Insecurity (02/18/2023)   Hunger Vital Sign    Worried About Running Out of Food in the Last Year: Never true    Ran Out of Food in the Last Year: Never true  Transportation Needs: No Transportation Needs (02/18/2023)   PRAPARE - Administrator, Civil Service (Medical): No    Lack of Transportation (Non-Medical): No  Physical Activity: Insufficiently Active (02/18/2023)   Exercise Vital Sign    Days of Exercise per Week: 2 days    Minutes of Exercise per Session: 20 min  Stress: No Stress Concern Present (02/18/2023)   [employer] of Occupational Health - Occupational Stress Questionnaire    Feeling of Stress : Not at all  Social Connections: Moderately Integrated (02/18/2023)   Social Connection and Isolation Panel [NHANES]    Frequency of Communication with Friends and Family: More than three times a week    Frequency of Social Gatherings with Friends and Family: Once a week    Attends Religious Services: More than 4 times per year    Active Member of [employer] or Organizations: Yes    Attends Banker Meetings: More than 4 times per year    Marital Status: Widowed   Catering manager Violence: Not on file    FAMILY HISTORY: Family History  Problem Relation Age of Onset   Cancer Mother        metastatic cancer (pancreas vs lung)   Hypertension Mother    Osteoarthritis Mother    Hypothyroidism Mother    Varicose Veins Mother    Bleeding Disorder Mother    Heart disease Father 63       CAD/MI   Hypertension Father    Arthritis Father    Hemochromatosis Father        Possible   Deep vein thrombosis Father    Varicose Veins Father    Heart  attack Father    Heart attack Brother 64   Hemochromatosis Brother    Heart disease Brother    Hyperlipidemia Son    Diabetes Neg Hx     ALLERGIES:  is allergic to contrast media [iodinated contrast media], adhesive [tape], and sulfonamide derivatives.  MEDICATIONS:  Current Outpatient Medications  Medication Sig Dispense Refill   fluticasone (FLONASE) 50 MCG/ACT nasal spray Place 2 sprays into both nostrils daily. 16 g 6   hydrALAZINE (APRESOLINE) 10 MG tablet Take 1 tablet (10 mg total) by mouth 2 (two) times daily. 180 tablet 3   HYDROcodone-acetaminophen (NORCO/VICODIN) 5-325 MG tablet 1 tab by mouth once per day as needed for severe pain 10 tablet 0   levothyroxine (SYNTHROID) 88 MCG tablet Take 1 tablet (88 mcg total) by mouth daily before breakfast. 90 tablet 3   metoprolol succinate (TOPROL-XL) 25 MG 24 hr tablet Take 1 tablet (25 mg total) by mouth daily. 90 tablet 3   omeprazole (PRILOSEC) 40 MG capsule Take 1 capsule (40 mg total) by mouth daily. 90 capsule 3   triamterene-hydrochlorothiazide (MAXZIDE-25) 37.5-25 MG tablet Take 1 tablet by mouth daily. 90 tablet 3   No current facility-administered medications for this visit.    REVIEW OF SYSTEMS:    10 Point review of Systems was done is negative except as noted above.   PHYSICAL EXAMINATION: ECOG PERFORMANCE STATUS: 1 - Symptomatic but completely ambulatory . Vitals:   06/08/23 1016 06/08/23 1017  BP: (!) 189/68 (!) 170/56   Pulse: 61   Resp: 18   Temp: (!) 97.3 F (36.3 C)   SpO2: 98%     Filed Weights   06/08/23 1016  Weight: 113 lb 12.8 oz (51.6 kg)   .Body mass index is 21.5 kg/m.  GENERAL:alert, in no acute distress and comfortable SKIN: no acute rashes, no significant lesions EYES: conjunctiva are pink and non-injected, sclera anicteric OROPHARYNX: MMM, no exudates, no oropharyngeal erythema or ulceration NECK: supple, no JVD LYMPH:  no palpable lymphadenopathy in the cervical, axillary or inguinal regions LUNGS: clear to auscultation b/l with normal respiratory effort HEART: regular rate & rhythm ABDOMEN:  normoactive bowel sounds , non tender, not distended. Extremity: no pedal edema PSYCH: alert & oriented x 3 with fluent speech NEURO: no focal motor/sensory deficits    LABORATORY DATA:  I have reviewed the data as listed  .    Latest Ref Rng & Units 06/08/2023    9:43 AM 11/30/2022   12:27 PM 08/20/2022   10:34 AM  CBC  WBC 4.0 - 10.5 K/uL 9.3  6.6  8.3   Hemoglobin 12.0 - 15.0 g/dL 45.4  09.8  11.9   Hematocrit 36.0 - 46.0 % 42.7  39.8  45.4   Platelets 150 - 400 K/uL 226  233  288.0     .    Latest Ref Rng & Units 06/08/2023    9:43 AM 11/30/2022   12:27 PM 08/20/2022   10:34 AM  CMP  Glucose 70 - 99 mg/dL 73  95  93   BUN 8 - 23 mg/dL 16  15  18    Creatinine 0.44 - 1.00 mg/dL 1.47  8.29  5.62   Sodium 135 - 145 mmol/L 135  132  132   Potassium 3.5 - 5.1 mmol/L 4.4  4.3  4.4   Chloride 98 - 111 mmol/L 98  96  94   CO2 22 - 32 mmol/L 31  29  29    Calcium 8.9 -  10.3 mg/dL 9.5  9.1  9.7   Total Protein 6.5 - 8.1 g/dL 7.0  6.8  7.6   Total Bilirubin 0.0 - 1.2 mg/dL 0.7  0.9  0.8   Alkaline Phos 38 - 126 U/L 94  81  102   AST 15 - 41 U/L 27  22  25    ALT 0 - 44 U/L 20  14  18     . Lab Results  Component Value Date   IRON 225 (H) 06/08/2023   TIBC 322 06/08/2023   IRONPCTSAT 70 (H) 06/08/2023   (Iron and TIBC)  Lab Results  Component Value Date   FERRITIN 91  06/08/2023    Hemochromatosis DNA-PCR(c282y,h63d)  Order: [id number]  Status:  Final result   Visible to patient:  Yes (MyChart) Next appt:  10/06/2017 at 10:45 AM in Oncology [facility]) Dx:  Hereditary hemochromatosis (HCC)  Component 3wk ago  Hemochromatosis Gene Comment   Comment: Result:  AFFECTED  Two copies of the same mutation (C282Y and C282Y) identified         RADIOGRAPHIC STUDIES: I have personally reviewed the radiological images as listed and agreed with the findings in the report. No results found.  ASSESSMENT & PLAN:   89 y.o. female with  #1 history of hemochromatosis . Homozygous C282Y mutation  Patient notes that she has never had a ferritin level more than 1000. Family history positive. Her goal has been to keep her ferritin levels below 50. She has roughly needed a therapeutic phlebotomy every 6 months or so.   #2 T1N0 node negative right breast cancer 1994, post mastectomy with nodes, 5 years of adjuvant tamoxifen. Plan  -Continue yearly mammograms with primary care physician.  PLAN:  -Discussed lab results from today, 06/08/2023, in detail with the patient. CBC stable. Cmp pending. Iron labs pending.  -Recommend patient to follow-up with PCP regarding hypertension.  -will pursue therapaeutic phlebotomy for ferritin>100. Patient prefers phlebotomy next week.  -Answered all of patient's questions.  -Recommended patient to stay UTD on vaccinations   FOLLOW UP:  Therapeutic phlebotomy in 1 week . Patient prefers appointment in the afternoon Labs and therapeutic phlebotomy every 6 months x 3 MD visit in 12 months  The total time spent in the appointment was 20 minutes* .  All of the patient's questions were answered with apparent satisfaction. The patient knows to call the clinic with any problems, questions or concerns.   Brandy Lora MD MS AAHIVMS [facility] Hematology/Oncology Physician [facility]  .*Total Encounter Time as  defined by the Centers for Medicare and Medicaid Services includes, in addition to the face-to-face time of a patient visit (documented in the note above) non-face-to-face time: obtaining and reviewing outside history, ordering and reviewing medications, tests or procedures, care coordination (communications with other health care professionals or caregivers) and documentation in the medical record.   I,Param Shah,acting as a Neurosurgeon for Brandy Lora, MD.,have documented all relevant documentation on the behalf of Brandy Lora, MD,as directed by  Brandy Lora, MD while in the presence of Brandy Lora, MD.  .I have reviewed the above documentation for accuracy and completeness, and I agree with the above. Johney Maine MD

## 2023-06-14 ENCOUNTER — Encounter: Payer: Self-pay | Admitting: Hematology

## 2023-06-15 ENCOUNTER — Telehealth: Payer: Self-pay | Admitting: Hematology

## 2023-06-15 ENCOUNTER — Encounter: Payer: Self-pay | Admitting: Hematology

## 2023-06-16 ENCOUNTER — Other Ambulatory Visit: Payer: Self-pay | Admitting: Hematology

## 2023-06-17 ENCOUNTER — Other Ambulatory Visit

## 2023-06-17 ENCOUNTER — Telehealth: Payer: Self-pay | Admitting: Hematology

## 2023-06-17 ENCOUNTER — Inpatient Hospital Stay

## 2023-06-17 NOTE — Telephone Encounter (Signed)
 Canceled appointment per patients request via incoming call. Patient states she is not feeling well this morning and states she will call back to reschedule.

## 2023-06-21 ENCOUNTER — Telehealth: Payer: Self-pay | Admitting: Hematology

## 2023-06-23 ENCOUNTER — Encounter: Payer: Self-pay | Admitting: Emergency Medicine

## 2023-06-23 ENCOUNTER — Ambulatory Visit: Payer: Medicare Other | Admitting: Emergency Medicine

## 2023-06-23 ENCOUNTER — Ambulatory Visit

## 2023-06-23 VITALS — BP 140/78 | HR 64 | Temp 97.5°F | Ht 61.0 in | Wt 114.4 lb

## 2023-06-23 DIAGNOSIS — Z87891 Personal history of nicotine dependence: Secondary | ICD-10-CM

## 2023-06-23 DIAGNOSIS — J189 Pneumonia, unspecified organism: Secondary | ICD-10-CM

## 2023-06-23 DIAGNOSIS — J40 Bronchitis, not specified as acute or chronic: Secondary | ICD-10-CM | POA: Diagnosis not present

## 2023-06-23 DIAGNOSIS — J439 Emphysema, unspecified: Secondary | ICD-10-CM | POA: Diagnosis not present

## 2023-06-23 MED ORDER — DOXYCYCLINE HYCLATE 100 MG PO TABS: 100.0000 mg | ORAL_TABLET | Freq: Two times a day (BID) | ORAL | 0 refills | Status: DC

## 2023-06-23 NOTE — Patient Instructions (Signed)
 We will perform a chest x-ray today. Please take doxycycline as directed until completely gone. Continue your fluticasone nasal spray as you have been taking it. Please call our office or seek care if you worsen in any way. We will plan to see you in office next week to ensure that you are improving.

## 2023-06-23 NOTE — Assessment & Plan Note (Signed)
 She needs a chest x-ray to confirm but clinically she sounds like she has developed a pneumonia post URI.  She has left-sided rhonchi on exam.  I will check a chest x-ray now.  Treat her with doxycycline.  She looks well enough to be treated as an outpatient but at her age I have cautioned her that if she worsens in any way that she needs to call us, seek care in the UC or ED.  We will see her back in 1 week to ensure that she is improving or at least stable

## 2023-06-23 NOTE — Progress Notes (Signed)
 Subjective:    Patient ID: Brandy Peterson, female    DOB: 1930/06/14, 89 y.o.   MRN: [id number]  HPI   ROV 01/21/22 -- 89 yo woman, former smoker, suspected mild COPD. Have followed her for globus sensation / cough, RLL pulm nodule and bronchiectasis on serial imaging. She is doing well, no cough currently. She does have some exertional SOB when she is lifting something heavy, resolves w rest. Happens with chores, lifting garbage etc. She still does yard work. No wheeze.   LDCT 12/26/21 reviewed by me shows no suspicious nodules, no change minor fissure small nodule.  She did have aortic and coronary calcifications.   Acute office visit 06/23/23 --89 year old woman with mild COPD, former tobacco use, hemochromatosis, bronchiectasis and history of nodular disease (nodule resolved on lung cancer screening CT 12/2021).  She has a chronic globus sensation, chronic cough. She is on nasal steroid, sometimes uses nasal saline.  She began to have a scratchy throat last week. Then developed some URI sx, head congestion. She does not believe this is her allergy. No fever.  Now cough coughing up clear to white mucous, rarely colored. She is coughing at night, hears noise in her chest    Review of Systems As per HPi  Past Medical History:  Diagnosis Date   Carcinoma of breast (HCC)    Right   Complication of anesthesia    was told to never use Sodium Pentothal - mother had reaction. Also had trouble with intubation during mastectomy   COPD (chronic obstructive pulmonary disease) (HCC)    Family history of adverse reaction to anesthesia    GERD (gastroesophageal reflux disease)    Hemochromatosis    History of shingles    Hypertension    Hypothyroidism    IBS (irritable bowel syndrome)    Plantar fasciitis    Pneumonia    Raynaud's syndrome    Spastic colon    hx of    Thyroid disease    Varicose veins      Family History  Problem Relation Age of Onset   Cancer Mother        metastatic  cancer (pancreas vs lung)   Hypertension Mother    Osteoarthritis Mother    Hypothyroidism Mother    Varicose Veins Mother    Bleeding Disorder Mother    Heart disease Father 15       CAD/MI   Hypertension Father    Arthritis Father    Hemochromatosis Father        Possible   Deep vein thrombosis Father    Varicose Veins Father    Heart attack Father    Heart attack Brother 48   Hemochromatosis Brother    Heart disease Brother    Hyperlipidemia Son    Diabetes Neg Hx      Social History   Socioeconomic History   Marital status: Widowed    Spouse name: Not on file   Number of children: 1   Years of education: 79   Highest education level: 12th grade  Occupational History   Occupation: Retired  Tobacco Use   Smoking status: Former    Current packs/day: 0.00    Types: Cigarettes    Quit date: 04/13/1988    Years since quitting: 35.2   Smokeless tobacco: Never   Tobacco comments:    approximate quit date  Vaping Use   Vaping status: Never Used  Substance and Sexual Activity   Alcohol use: Yes  Alcohol/week: 3.0 standard drinks of alcohol    Types: 3 Glasses of wine per week    Comment: 2oz of wine occasionally   Drug use: No   Sexual activity: Never  Other Topics Concern   Not on file  Social History Narrative   Engineer, agricultural, 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. Pt married in 1953 and was widowed in 2002. She now lives alone I-ADLs. Pt has 1 son born 29 and 2 grandchildren.       Pt had a colonoscopy on 05/09/2012 by Dr Randa Evens.       Pt reports having a pneumo vaccine.      Right-handed.   4-5 cups caffeine daily.   Lives alone.   Social Drivers of Corporate investment banker Strain: Low Risk  (02/18/2023)   Overall Financial Resource Strain (CARDIA)    Difficulty of Paying Living Expenses: Not hard at all  Food Insecurity: No Food Insecurity (02/18/2023)   Hunger Vital Sign    Worried About Running Out of Food in the Last Year: Never true     Ran Out of Food in the Last Year: Never true  Transportation Needs: No Transportation Needs (02/18/2023)   PRAPARE - Administrator, Civil Service (Medical): No    Lack of Transportation (Non-Medical): No  Physical Activity: Insufficiently Active (02/18/2023)   Exercise Vital Sign    Days of Exercise per Week: 2 days    Minutes of Exercise per Session: 20 min  Stress: No Stress Concern Present (02/18/2023)   [employer] of Occupational Health - Occupational Stress Questionnaire    Feeling of Stress : Not at all  Social Connections: Moderately Integrated (02/18/2023)   Social Connection and Isolation Panel [NHANES]    Frequency of Communication with Friends and Family: More than three times a week    Frequency of Social Gatherings with Friends and Family: Once a week    Attends Religious Services: More than 4 times per year    Active Member of [employer] or Organizations: Yes    Attends Banker Meetings: More than 4 times per year    Marital Status: Widowed  Intimate Partner Violence: Not on file    Has always lived in [place] 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   Allergies  Allergen Reactions   Contrast Media [Iodinated Contrast Media] Hives, Itching and Other (See Comments)    Felt like she was going to faint    Adhesive [Tape] Other (See Comments)    Prefers paper tape   Sulfonamide Derivatives Other (See Comments)    Reaction: unknown      Outpatient Medications Prior to Visit  Medication Sig Dispense Refill   fluticasone (FLONASE) 50 MCG/ACT nasal spray Place 2 sprays into both nostrils daily. 16 g 6   hydrALAZINE (APRESOLINE) 10 MG tablet Take 1 tablet (10 mg total) by mouth 2 (two) times daily. 180 tablet 3   levothyroxine (SYNTHROID) 88 MCG tablet Take 1 tablet (88 mcg total) by mouth daily before breakfast. 90 tablet 3   metoprolol succinate (TOPROL-XL) 25 MG 24 hr tablet Take 1 tablet (25 mg total) by mouth daily. 90 tablet 3   omeprazole  (PRILOSEC) 40 MG capsule Take 1 capsule (40 mg total) by mouth daily. 90 capsule 3   triamterene-hydrochlorothiazide (MAXZIDE-25) 37.5-25 MG tablet Take 1 tablet by mouth daily. 90 tablet 3   HYDROcodone-acetaminophen (NORCO/VICODIN) 5-325 MG tablet 1 tab by mouth once per day as needed for severe  pain 10 tablet 0   No facility-administered medications prior to visit.         Objective:   Physical Exam Vitals:   06/23/23 1447 06/23/23 1451  BP: (!) 180/69 (!) 140/78  Pulse: 64   Temp: (!) 97.5 F (36.4 C)   SpO2: 98%   Weight: 114 lb 6.4 oz (51.9 kg)   Height: 5\' 1"  (1.549 m)    Gen: Pleasant, thin elderly woman, in no distress,  normal affect  ENT: No lesions,  mouth clear,  oropharynx clear, no postnasal drip  Neck: No JVD, no stridor  Lungs: No use of accessory muscles, she has focal rhonchi on insp and exp L mid lung field.   Cardiovascular: RRR, heart sounds normal, no murmur or gallops, no peripheral edema  Musculoskeletal: No deformities, no cyanosis or clubbing  Neuro: alert, awake, non focal  Skin: Warm, no lesions or rash     Assessment & Plan:  Community acquired pneumonia She needs a chest x-ray to confirm but clinically she sounds like she has developed a pneumonia post URI.  She has left-sided rhonchi on exam.  I will check a chest x-ray now.  Treat her with doxycycline.  She looks well enough to be treated as an outpatient but at her age I have cautioned her that if she worsens in any way that she needs to call us, seek care in the UC or ED.  We will see her back in 1 week to ensure that she is improving or at least stable     Levy Pupa, MD, PhD 06/23/2023, 3:22 PM [facility] [phone] or if no answer [phone]

## 2023-06-25 ENCOUNTER — Encounter: Payer: Self-pay | Admitting: Emergency Medicine

## 2023-06-30 ENCOUNTER — Encounter: Payer: Self-pay | Admitting: Pulmonary Disease

## 2023-06-30 ENCOUNTER — Ambulatory Visit: Admitting: Pulmonary Disease

## 2023-06-30 VITALS — BP 157/60 | HR 65 | Temp 97.4°F | Ht 61.0 in | Wt 111.8 lb

## 2023-06-30 DIAGNOSIS — J471 Bronchiectasis with (acute) exacerbation: Secondary | ICD-10-CM

## 2023-06-30 DIAGNOSIS — R911 Solitary pulmonary nodule: Secondary | ICD-10-CM

## 2023-06-30 MED ORDER — DOXYCYCLINE HYCLATE 100 MG PO TABS: 100.0000 mg | ORAL_TABLET | Freq: Two times a day (BID) | ORAL | 0 refills | Status: DC

## 2023-06-30 NOTE — Patient Instructions (Addendum)
 Nice to meet you  I am going to extend the doxycycline for 1 more week, 1 tablet twice a day  Your chest x-ray last week was clear  I checked and  back in 2023 the nodule has been stable for a long enough that they felt like it did not need to be followed anymore  Return to clinic in 6 months or sooner as needed with Dr. Delton Coombes

## 2023-07-08 NOTE — Progress Notes (Signed)
 @Patient  ID: Brandy Peterson, female    DOB: 26-Dec-1930, 89 y.o.   MRN: [id number]  Chief Complaint  Patient presents with   Follow-up    Referring provider: Corwin Levins, MD  HPI:   89 y.o. former smoker bronchiectasis on CT scan who presents for follow-up of recent illness with worsening cough.  Most recent pulmonary note last week reviewed.  Reviewed chest x-ray.  Clear on my review and interpretation.  Suspect bronchiectasis exacerbation.  Has completed about 7 days of antibiotics.  Cough is much improved.  But still present.  We discussed extending antibiotic course given bronchiectasis to 2 weeks.  She expressed understanding and agreement.  She asked about the lung nodule to follow in the past.  This was last surveilled in 2023 and stable over a period of time.  We discussed no further follow-up is needed.   Questionaires / Pulmonary Flowsheets:   ACT:      No data to display          MMRC:     No data to display          Epworth:      No data to display          Tests:   FENO:  No results found for: "NITRICOXIDE"  PFT:     No data to display          WALK:      No data to display          Imaging: Personally reviewed and as per EMR No results found.  Lab Results: Personally reviewed CBC    Component Value Date/Time   WBC 9.3 06/08/2023 0943   WBC 8.3 08/20/2022 1034   RBC 4.53 06/08/2023 0943   HGB 14.8 06/08/2023 0943   HGB 14.4 03/23/2017 1409   HCT 42.7 06/08/2023 0943   HCT 41.4 03/23/2017 1409   PLT 226 06/08/2023 0943   PLT 232 03/23/2017 1409   MCV 94.3 06/08/2023 0943   MCV 93.7 03/23/2017 1409   MCH 32.7 06/08/2023 0943   MCHC 34.7 06/08/2023 0943   RDW 13.2 06/08/2023 0943   RDW 12.6 03/23/2017 1409   LYMPHSABS 1.3 06/08/2023 0943   LYMPHSABS 1.1 03/23/2017 1409   MONOABS 0.8 06/08/2023 0943   MONOABS 0.6 03/23/2017 1409   EOSABS 0.2 06/08/2023 0943   EOSABS 0.3 03/23/2017 1409   BASOSABS 0.0 06/08/2023  0943   BASOSABS 0.0 03/23/2017 1409    BMET    Component Value Date/Time   NA 135 06/08/2023 0943   NA 139 03/23/2017 1415   K 4.4 06/08/2023 0943   K 3.4 (L) 03/23/2017 1415   CL 98 06/08/2023 0943   CL 100 05/13/2012 1037   CO2 31 06/08/2023 0943   CO2 27 03/23/2017 1415   GLUCOSE 73 06/08/2023 0943   GLUCOSE 83 03/23/2017 1415   GLUCOSE 88 05/13/2012 1037   BUN 16 06/08/2023 0943   BUN 11.2 03/23/2017 1415   CREATININE 0.74 06/08/2023 0943   CREATININE 0.8 03/23/2017 1415   CALCIUM 9.5 06/08/2023 0943   CALCIUM 9.1 03/23/2017 1415   GFRNONAA >60 06/08/2023 0943   GFRAA >60 10/17/2019 1049    BNP    Component Value Date/Time   BNP 138.6 (H) 02/13/2022 1410    ProBNP No results found for: "PROBNP"  Specialty Problems       Pulmonary Problems   Allergic rhinitis   Has a h/o allergy testing and was on immunotherapy  for several years.       COPD (chronic obstructive pulmonary disease) (HCC)   Acute upper respiratory infection   Acute non-recurrent frontal sinusitis   Bronchiectasis without acute exacerbation (HCC)   Pulmonary nodule   Community acquired pneumonia    Allergies  Allergen Reactions   Contrast Media [Iodinated Contrast Media] Hives, Itching and Other (See Comments)    Felt like she was going to faint    Adhesive [Tape] Other (See Comments)    Prefers paper tape   Sulfonamide Derivatives Other (See Comments)    Reaction: unknown     Immunization History  Administered Date(s) Administered   Fluad Quad(high Dose 65+) 12/14/2018, 12/12/2019   H1N1 03/21/2008   Influenza Split 12/29/2011   Influenza Whole 12/24/2009   Influenza, High Dose Seasonal PF 01/21/2016, 01/21/2017, 01/07/2018   Influenza,inj,Quad PF,6+ Mos 01/16/2014   Influenza-Unspecified 01/02/2013, 03/11/2015, 12/14/2018, 01/19/2022, 02/05/2023   Moderna Covid-19 Vaccine Bivalent Booster 93yrs & up 02/14/2021   Moderna Sars-Covid-2 Vaccination 05/04/2019, 06/09/2019,  02/05/2020, 10/09/2020   Pneumococcal Conjugate-13 05/02/2013   Pneumococcal Polysaccharide-23 01/25/2002   Tdap 04/24/2005, 04/05/2015   Unspecified SARS-COV-2 Vaccination 01/09/2023    Past Medical History:  Diagnosis Date   Carcinoma of breast (HCC)    Right   Complication of anesthesia    was told to never use Sodium Pentothal - mother had reaction. Also had trouble with intubation during mastectomy   COPD (chronic obstructive pulmonary disease) (HCC)    Family history of adverse reaction to anesthesia    GERD (gastroesophageal reflux disease)    Hemochromatosis    History of shingles    Hypertension    Hypothyroidism    IBS (irritable bowel syndrome)    Plantar fasciitis    Pneumonia    Raynaud's syndrome    Spastic colon    hx of    Thyroid disease    Varicose veins     Tobacco History: Social History   Tobacco Use  Smoking Status Former   Current packs/day: 0.00   Types: Cigarettes   Quit date: 04/13/1988   Years since quitting: 35.2  Smokeless Tobacco Never  Tobacco Comments   approximate quit date   Counseling given: Not Answered Tobacco comments: approximate quit date   Continue to not smoke  Outpatient Encounter Medications as of 06/30/2023  Medication Sig   doxycycline (VIBRA-TABS) 100 MG tablet Take 1 tablet (100 mg total) by mouth 2 (two) times daily.   fluticasone (FLONASE) 50 MCG/ACT nasal spray Place 2 sprays into both nostrils daily.   hydrALAZINE (APRESOLINE) 10 MG tablet Take 1 tablet (10 mg total) by mouth 2 (two) times daily.   HYDROcodone-acetaminophen (NORCO/VICODIN) 5-325 MG tablet 1 tab by mouth once per day as needed for severe pain   levothyroxine (SYNTHROID) 88 MCG tablet Take 1 tablet (88 mcg total) by mouth daily before breakfast.   metoprolol succinate (TOPROL-XL) 25 MG 24 hr tablet Take 1 tablet (25 mg total) by mouth daily.   omeprazole (PRILOSEC) 40 MG capsule Take 1 capsule (40 mg total) by mouth daily.    triamterene-hydrochlorothiazide (MAXZIDE-25) 37.5-25 MG tablet Take 1 tablet by mouth daily.   [DISCONTINUED] doxycycline (VIBRA-TABS) 100 MG tablet Take 1 tablet (100 mg total) by mouth 2 (two) times daily.   No facility-administered encounter medications on file as of 06/30/2023.     Review of Systems  Review of Systems  No chest pain with exertion.  No orthopnea or PND.  Comprehensive review of systems otherwise negative.  Physical Exam  BP (!) 157/60 (BP Location: Left Arm, Patient Position: Sitting, Cuff Size: Normal)   Pulse 65   Temp (!) 97.4 F (36.3 C) (Temporal)   Ht 5\' 1"  (1.549 m)   Wt 111 lb 12.8 oz (50.7 kg)   SpO2 97%   BMI 21.12 kg/m   Wt Readings from Last 5 Encounters:  06/30/23 111 lb 12.8 oz (50.7 kg)  06/23/23 114 lb 6.4 oz (51.9 kg)  06/08/23 113 lb 12.8 oz (51.6 kg)  02/22/23 110 lb 6.4 oz (50.1 kg)  11/30/22 110 lb 9.6 oz (50.2 kg)    BMI Readings from Last 5 Encounters:  06/30/23 21.12 kg/m  06/23/23 21.62 kg/m  06/08/23 21.50 kg/m  02/22/23 20.86 kg/m  11/30/22 20.90 kg/m     Physical Exam General: Sitting in chair, no acute distress Eyes: EOMI, no icterus Neck: Supple, no JVP Pulmonary: Clear, normal breathing Abdomen: Nondistended, sounds present MSK: No synovitis, no joint effusion Neuro: Normal gait, no weakness Psych: Normal mood, full affect   Assessment & Plan:   Bronchiectasis with acute exacerbation: Improving but ongoing.  Completed 7 days of antibiotics.  Extend doxycycline for 7 more days, 14 days total in setting of bronchiectasis.  At baseline she has minimal symptoms.  Consider institution of airway clearance in the future.  Lung nodule: We discussed this at length today.  Lasting in 2023.  Reviewed several images with prior surveillance of demonstrate stability.  Per radiology and Dr. Delton Coombes no further follow-up was needed.  She expressed understanding.   Return in about 6 months (around 12/31/2023) for f/u Dr.  Delton Coombes.   Karren Burly, MD 07/08/2023

## 2023-08-19 ENCOUNTER — Encounter: Payer: Self-pay | Admitting: Internal Medicine

## 2023-08-19 ENCOUNTER — Ambulatory Visit (INDEPENDENT_AMBULATORY_CARE_PROVIDER_SITE_OTHER): Payer: Medicare Other | Admitting: Internal Medicine

## 2023-08-19 VITALS — BP 120/72 | HR 50 | Temp 97.6°F | Ht 61.0 in | Wt 113.0 lb

## 2023-08-19 DIAGNOSIS — E7800 Pure hypercholesterolemia, unspecified: Secondary | ICD-10-CM | POA: Diagnosis not present

## 2023-08-19 DIAGNOSIS — E538 Deficiency of other specified B group vitamins: Secondary | ICD-10-CM

## 2023-08-19 DIAGNOSIS — R739 Hyperglycemia, unspecified: Secondary | ICD-10-CM

## 2023-08-19 DIAGNOSIS — I10 Essential (primary) hypertension: Secondary | ICD-10-CM | POA: Diagnosis not present

## 2023-08-19 DIAGNOSIS — E039 Hypothyroidism, unspecified: Secondary | ICD-10-CM

## 2023-08-19 DIAGNOSIS — E559 Vitamin D deficiency, unspecified: Secondary | ICD-10-CM

## 2023-08-19 DIAGNOSIS — J449 Chronic obstructive pulmonary disease, unspecified: Secondary | ICD-10-CM

## 2023-08-19 LAB — CBC WITH DIFFERENTIAL/PLATELET
Basophils Absolute: 0 10*3/uL (ref 0.0–0.1)
Basophils Relative: 0.5 % (ref 0.0–3.0)
Eosinophils Absolute: 0.2 10*3/uL (ref 0.0–0.7)
Eosinophils Relative: 3.6 % (ref 0.0–5.0)
HCT: 42 % (ref 36.0–46.0)
Hemoglobin: 14.5 g/dL (ref 12.0–15.0)
Lymphocytes Relative: 16.8 % (ref 12.0–46.0)
Lymphs Abs: 1.1 10*3/uL (ref 0.7–4.0)
MCHC: 34.6 g/dL (ref 30.0–36.0)
MCV: 96.8 fl (ref 78.0–100.0)
Monocytes Absolute: 0.7 10*3/uL (ref 0.1–1.0)
Monocytes Relative: 10.5 % (ref 3.0–12.0)
Neutro Abs: 4.4 10*3/uL (ref 1.4–7.7)
Neutrophils Relative %: 68.6 % (ref 43.0–77.0)
Platelets: 246 10*3/uL (ref 150.0–400.0)
RBC: 4.33 Mil/uL (ref 3.87–5.11)
RDW: 12.5 % (ref 11.5–15.5)
WBC: 6.5 10*3/uL (ref 4.0–10.5)

## 2023-08-19 LAB — URINALYSIS, ROUTINE W REFLEX MICROSCOPIC
Bilirubin Urine: NEGATIVE
Hgb urine dipstick: NEGATIVE
Ketones, ur: NEGATIVE
Leukocytes,Ua: NEGATIVE
Nitrite: NEGATIVE
RBC / HPF: NONE SEEN (ref 0–?)
Specific Gravity, Urine: 1.01 (ref 1.000–1.030)
Total Protein, Urine: NEGATIVE
Urine Glucose: NEGATIVE
Urobilinogen, UA: 0.2 (ref 0.0–1.0)
WBC, UA: NONE SEEN (ref 0–?)
pH: 7 (ref 5.0–8.0)

## 2023-08-19 LAB — LIPID PANEL
Cholesterol: 210 mg/dL — ABNORMAL HIGH (ref 0–200)
HDL: 71.5 mg/dL (ref >39.00)
LDL Cholesterol: 124 mg/dL — ABNORMAL HIGH (ref 0–99)
NonHDL: 138.33
Total CHOL/HDL Ratio: 3
Triglycerides: 73 mg/dL (ref 0.0–149.0)
VLDL: 14.6 mg/dL (ref 0.0–40.0)

## 2023-08-19 LAB — BASIC METABOLIC PANEL WITH GFR
BUN: 20 mg/dL (ref 6–23)
CO2: 29 meq/L (ref 19–32)
Calcium: 9.4 mg/dL (ref 8.4–10.5)
Chloride: 98 meq/L (ref 96–112)
Creatinine, Ser: 0.71 mg/dL (ref 0.40–1.20)
GFR: 74.75 mL/min (ref >60.00)
Glucose, Bld: 83 mg/dL (ref 70–99)
Potassium: 4.4 meq/L (ref 3.5–5.1)
Sodium: 133 meq/L — ABNORMAL LOW (ref 135–145)

## 2023-08-19 LAB — HEPATIC FUNCTION PANEL
ALT: 14 U/L (ref 0–35)
AST: 23 U/L (ref 0–37)
Albumin: 4.2 g/dL (ref 3.5–5.2)
Alkaline Phosphatase: 106 U/L (ref 39–117)
Bilirubin, Direct: 0.1 mg/dL (ref 0.0–0.3)
Total Bilirubin: 0.6 mg/dL (ref 0.2–1.2)
Total Protein: 7.1 g/dL (ref 6.0–8.3)

## 2023-08-19 LAB — VITAMIN B12: Vitamin B-12: 478 pg/mL (ref 211–911)

## 2023-08-19 LAB — VITAMIN D 25 HYDROXY (VIT D DEFICIENCY, FRACTURES): VITD: 37.02 ng/mL (ref 30.00–100.00)

## 2023-08-19 LAB — TSH: TSH: 2.84 u[IU]/mL (ref 0.35–5.50)

## 2023-08-19 LAB — HEMOGLOBIN A1C: Hgb A1c MFr Bld: 5 % (ref 4.6–6.5)

## 2023-08-19 MED ORDER — FLUTICASONE PROPIONATE 50 MCG/ACT NA SUSP: 2.0000 | Freq: Every day | NASAL | 6 refills | Status: AC

## 2023-08-19 MED ORDER — TRIAMTERENE-HCTZ 37.5-25 MG PO TABS: 1.0000 | ORAL_TABLET | Freq: Every day | ORAL | 3 refills | Status: AC

## 2023-08-19 MED ORDER — OMEPRAZOLE 40 MG PO CPDR: 40.0000 mg | DELAYED_RELEASE_CAPSULE | Freq: Every day | ORAL | 3 refills | Status: AC

## 2023-08-19 MED ORDER — LEVOTHYROXINE SODIUM 88 MCG PO TABS: 88.0000 ug | ORAL_TABLET | Freq: Every day | ORAL | 3 refills | Status: AC

## 2023-08-19 MED ORDER — HYDROCODONE-ACETAMINOPHEN 5-325 MG PO TABS: ORAL_TABLET | ORAL | 0 refills | Status: DC

## 2023-08-19 MED ORDER — METOPROLOL SUCCINATE ER 25 MG PO TB24: 25.0000 mg | ORAL_TABLET | Freq: Every day | ORAL | 3 refills | Status: AC

## 2023-08-19 NOTE — Patient Instructions (Addendum)
 Please consider to have your Shingrix (shingles) shots done at your local pharmacy.  Please continue all other medications as before, and refills have been done if requested.  Please have the pharmacy call with any other refills you may need.  Please continue your efforts at being more active, low cholesterol diet, and weight control.  You are otherwise up to date with prevention measures today.  Please keep your appointments with your specialists as you may have planned  Please go to the LAB at the blood drawing area for the tests to be done  You will be contacted by phone if any changes need to be made immediately.  Otherwise, you will receive a letter about your results with an explanation, but please check with MyChart first.  Please make an Appointment to return in 6 months, or sooner if needed

## 2023-08-19 NOTE — Progress Notes (Addendum)
 Patient ID: Brandy Peterson, female   DOB: Aug 12, 1930, 89 y.o.   MRN: [id number]        Chief Complaint: follow up HTN, HLD and hyperglycemia , low vit d , copd, low thryoid       HPI:  Brandy Peterson is a 89 y.o. female here overall doing ok, Pt denies chest pain, increased sob or doe, wheezing, orthopnea, PND, palpitations, dizziness or syncope, but does mild bipedal edema without pain or trauma..   Pt denies polydipsia, polyuria, or new focal neuro s/s.  Pt denies fever, wt loss, night sweats, loss of appetite, or other constitutional symptoms        Wt Readings from Last 3 Encounters:  08/19/23 113 lb (51.3 kg)  06/30/23 111 lb 12.8 oz (50.7 kg)  06/23/23 114 lb 6.4 oz (51.9 kg)   BP Readings from Last 3 Encounters:  08/19/23 120/72  06/30/23 (!) 157/60  06/23/23 (!) 140/78         Past Medical History:  Diagnosis Date   Carcinoma of breast (HCC)    Right   Complication of anesthesia    was told to never use Sodium Pentothal - mother had reaction. Also had trouble with intubation during mastectomy   COPD (chronic obstructive pulmonary disease) (HCC)    Family history of adverse reaction to anesthesia    GERD (gastroesophageal reflux disease)    Hemochromatosis    History of shingles    Hypertension    Hypothyroidism    IBS (irritable bowel syndrome)    Plantar fasciitis    Pneumonia    Raynaud's syndrome    Spastic colon    hx of    Thyroid  disease    Varicose veins    Past Surgical History:  Procedure Laterality Date   ABDOMINAL HYSTERECTOMY  1979   PARTIAL   APPENDECTOMY     BONE SPUR  1970   BOTH FEET   CATARACT EXTRACTION  10/2012 and 12/2012   bilateral with lens implants   COLONOSCOPY     KYPHOPLASTY N/A 10/19/2014   Procedure: Lumbar 1 kyphoplasty;  Surgeon: Yvonna Herder, MD;  Location: [facility];  Service: Neurosurgery;  Laterality: N/A;   LUMBAR LAMINECTOMY  1991   L5-S1   MASTECTOMY  1994   RIGHT   MASTECTOMY  1994   Right    reports that she quit  smoking about 35 years ago. Her smoking use included cigarettes. She has never used smokeless tobacco. She reports current alcohol use of about 3.0 standard drinks of alcohol per week. She reports that she does not use drugs. family history includes Arthritis in her father; Bleeding Disorder in her mother; Cancer in her mother; Deep vein thrombosis in her father; Heart attack in her father; Heart attack (age of onset: 12) in her brother; Heart disease in her brother; Heart disease (age of onset: 42) in her father; Hemochromatosis in her brother and father; Hyperlipidemia in her son; Hypertension in her father and mother; Hypothyroidism in her mother; Osteoarthritis in her mother; Varicose Veins in her father and mother. Allergies  Allergen Reactions   Contrast Media [Iodinated Contrast Media] Hives, Itching and Other (See Comments)    Felt like she was going to faint    Adhesive [Tape] Other (See Comments)    Prefers paper tape   Sulfonamide Derivatives Other (See Comments)    Reaction: unknown    Current Outpatient Medications on File Prior to Visit  Medication Sig Dispense Refill  hydrALAZINE  (APRESOLINE ) 10 MG tablet Take 1 tablet (10 mg total) by mouth 2 (two) times daily. (Patient not taking: Reported on 08/19/2023) 180 tablet 3   No current facility-administered medications on file prior to visit.        ROS:  All others reviewed and negative.  Objective        PE:  BP 120/72 (BP Location: Right Arm, Patient Position: Sitting, Cuff Size: Normal)   Pulse (!) 50   Temp 97.6 F (36.4 C) (Oral)   Ht 5\' 1"  (1.549 m)   Wt 113 lb (51.3 kg)   SpO2 98%   BMI 21.35 kg/m                 Constitutional: Pt appears in NAD               HENT: Head: NCAT.                Right Ear: External ear normal.                 Left Ear: External ear normal.                Eyes: . Pupils are equal, round, and reactive to light. Conjunctivae and EOM are normal               Nose: without d/c or  deformity               Neck: Neck supple. Gross normal ROM               Cardiovascular: Normal rate and regular rhythm.                 Pulmonary/Chest: Effort normal and breath sounds without rales or wheezing.                Abd:  Soft, NT, ND, + BS, no organomegaly               Neurological: Pt is alert. At baseline orientation, motor grossly intact               Skin: Skin is warm. No rashes, no other new lesions, LE edema - bipedal edema               Psychiatric: Pt behavior is normal without agitation   Micro: none  Cardiac tracings I have personally interpreted today:  none  Pertinent Radiological findings (summarize): none   Lab Results  Component Value Date   WBC 6.5 08/19/2023   HGB 14.5 08/19/2023   HCT 42.0 08/19/2023   PLT 246.0 08/19/2023   GLUCOSE 83 08/19/2023   CHOL 210 (H) 08/19/2023   TRIG 73.0 08/19/2023   HDL 71.50 08/19/2023   LDLDIRECT 93.6 12/26/2010   LDLCALC 124 (H) 08/19/2023   ALT 14 08/19/2023   AST 23 08/19/2023   NA 133 (L) 08/19/2023   K 4.4 08/19/2023   CL 98 08/19/2023   CREATININE 0.71 08/19/2023   BUN 20 08/19/2023   CO2 29 08/19/2023   TSH 2.84 08/19/2023   HGBA1C 5.0 08/19/2023   Assessment/Plan:  Brandy Peterson is a 89 y.o. White or Caucasian [1] female with  has a past medical history of Carcinoma of breast (HCC), Complication of anesthesia, COPD (chronic obstructive pulmonary disease) (HCC), Family history of adverse reaction to anesthesia, GERD (gastroesophageal reflux disease), Hemochromatosis, History of shingles, Hypertension, Hypothyroidism, IBS (irritable bowel syndrome), Plantar fasciitis, Pneumonia, Raynaud's syndrome, Spastic colon, Thyroid   disease, and Varicose veins.  COPD (chronic obstructive pulmonary disease) (HCC) Stable, cont inhaler prn  Hypothyroidism Lab Results  Component Value Date   TSH 2.84 08/19/2023   Stable, pt to continue levothyroxine  88 mcg qd   Essential hypertension BP Readings from Last 3  Encounters:  08/19/23 120/72  06/30/23 (!) 157/60  06/23/23 (!) 140/78   Stable, pt to continue medical treatment hydralazine  10 mg bid, toprolxl 25 mg every day, maxide 25 qd   Vitamin D  deficiency Last vitamin D  Lab Results  Component Value Date   VD25OH 37.02 08/19/2023   Low, to start oral replacement   Hyperglycemia Lab Results  Component Value Date   HGBA1C 5.0 08/19/2023   Stable, pt to continue current medical treatment  - diet, wt control   HLD (hyperlipidemia) Lab Results  Component Value Date   LDLCALC 124 (H) 08/19/2023   Uncontrolled, , pt for lower chol diet, declines statin  Followup: Return in about 6 months (around 02/19/2024).  Rosalia Colonel, MD 08/21/2023 8:58 PM [facility]

## 2023-08-19 NOTE — Progress Notes (Signed)
 The test results show that your current treatment is OK, as the tests are stable.  Please continue the same plan.  There is no other need for change of treatment or further evaluation based on these results, at this time.  thanks

## 2023-08-21 ENCOUNTER — Encounter: Payer: Self-pay | Admitting: Internal Medicine

## 2023-08-21 NOTE — Assessment & Plan Note (Signed)
 Last vitamin D  Lab Results  Component Value Date   VD25OH 37.02 08/19/2023   Low, to start oral replacement

## 2023-08-21 NOTE — Assessment & Plan Note (Signed)
 BP Readings from Last 3 Encounters:  08/19/23 120/72  06/30/23 (!) 157/60  06/23/23 (!) 140/78   Stable, pt to continue medical treatment hydralazine  10 mg bid, toprolxl 25 mg every day, maxide 25 qd

## 2023-08-21 NOTE — Assessment & Plan Note (Signed)
 Lab Results  Component Value Date   TSH 2.84 08/19/2023   Stable, pt to continue levothyroxine  88 mcg qd

## 2023-08-21 NOTE — Assessment & Plan Note (Signed)
 Stable , cont inhaler prn

## 2023-08-21 NOTE — Assessment & Plan Note (Signed)
 Lab Results  Component Value Date   LDLCALC 124 (H) 08/19/2023   Uncontrolled, , pt for lower chol diet, declines statin

## 2023-08-21 NOTE — Assessment & Plan Note (Signed)
 Lab Results  Component Value Date   HGBA1C 5.0 08/19/2023   Stable, pt to continue current medical treatment  - diet, wt control

## 2023-12-16 ENCOUNTER — Other Ambulatory Visit: Payer: Self-pay

## 2023-12-17 ENCOUNTER — Inpatient Hospital Stay: Attending: Hematology

## 2023-12-17 LAB — CBC WITH DIFFERENTIAL (CANCER CENTER ONLY)
Abs Immature Granulocytes: 0.05 K/uL (ref 0.00–0.07)
Basophils Absolute: 0 K/uL (ref 0.0–0.1)
Basophils Relative: 0 %
Eosinophils Absolute: 0.2 K/uL (ref 0.0–0.5)
Eosinophils Relative: 3 %
HCT: 42.1 % (ref 36.0–46.0)
Hemoglobin: 14.8 g/dL (ref 12.0–15.0)
Immature Granulocytes: 1 %
Lymphocytes Relative: 19 %
Lymphs Abs: 1.2 K/uL (ref 0.7–4.0)
MCH: 32 pg (ref 26.0–34.0)
MCHC: 35.2 g/dL (ref 30.0–36.0)
MCV: 90.9 fL (ref 80.0–100.0)
Monocytes Absolute: 0.5 K/uL (ref 0.1–1.0)
Monocytes Relative: 9 %
Neutro Abs: 4.3 K/uL (ref 1.7–7.7)
Neutrophils Relative %: 68 %
Platelet Count: 241 K/uL (ref 150–400)
RBC: 4.63 MIL/uL (ref 3.87–5.11)
RDW: 12.3 % (ref 11.5–15.5)
WBC Count: 6.3 K/uL (ref 4.0–10.5)
nRBC: 0 % (ref 0.0–0.2)

## 2023-12-17 LAB — CMP (CANCER CENTER ONLY)
ALT: 14 U/L (ref 0–44)
AST: 21 U/L (ref 15–41)
Albumin: 4 g/dL (ref 3.5–5.0)
Alkaline Phosphatase: 97 U/L (ref 38–126)
Anion gap: 6 (ref 5–15)
BUN: 15 mg/dL (ref 8–23)
CO2: 30 mmol/L (ref 22–32)
Calcium: 9 mg/dL (ref 8.9–10.3)
Chloride: 97 mmol/L — ABNORMAL LOW (ref 98–111)
Creatinine: 0.65 mg/dL (ref 0.44–1.00)
GFR, Estimated: >60 mL/min (ref >60)
Glucose, Bld: 93 mg/dL (ref 70–99)
Potassium: 4.3 mmol/L (ref 3.5–5.1)
Sodium: 133 mmol/L — ABNORMAL LOW (ref 135–145)
Total Bilirubin: 0.5 mg/dL (ref 0.0–1.2)
Total Protein: 7 g/dL (ref 6.5–8.1)

## 2023-12-17 LAB — IRON AND IRON BINDING CAPACITY (CC-WL,HP ONLY)
Iron: 160 ug/dL (ref 28–170)
Saturation Ratios: 57 % — ABNORMAL HIGH (ref 10.4–31.8)
TIBC: 279 ug/dL (ref 250–450)
UIBC: 119 ug/dL — ABNORMAL LOW (ref 148–442)

## 2023-12-17 LAB — FERRITIN: Ferritin: 189 ng/mL (ref 11–307)

## 2023-12-24 ENCOUNTER — Inpatient Hospital Stay

## 2023-12-24 NOTE — Progress Notes (Signed)
 Brandy Peterson presents today for phlebotomy per MD orders. Phlebotomy procedure started at 1612 and ended at 1631. Phlebotomy kit used x 2 sticks. Food and fluids given. 537 grams removed. Patient observed for 30 minutes after procedure without any incident. Patient tolerated procedure well. IV needle removed intact.

## 2023-12-24 NOTE — Progress Notes (Signed)
 Patient observed for 30 minutes, vital WNL and patient denies any distress.

## 2023-12-29 ENCOUNTER — Ambulatory Visit (INDEPENDENT_AMBULATORY_CARE_PROVIDER_SITE_OTHER)

## 2023-12-29 ENCOUNTER — Ambulatory Visit: Admitting: Emergency Medicine

## 2023-12-29 ENCOUNTER — Encounter: Payer: Self-pay | Admitting: Emergency Medicine

## 2023-12-29 ENCOUNTER — Ambulatory Visit: Payer: Self-pay | Admitting: Emergency Medicine

## 2023-12-29 VITALS — BP 142/70 | HR 60 | Temp 97.7°F | Ht 61.0 in | Wt 108.8 lb

## 2023-12-29 DIAGNOSIS — J439 Emphysema, unspecified: Secondary | ICD-10-CM | POA: Diagnosis not present

## 2023-12-29 DIAGNOSIS — Z87891 Personal history of nicotine dependence: Secondary | ICD-10-CM | POA: Diagnosis not present

## 2023-12-29 DIAGNOSIS — R0602 Shortness of breath: Secondary | ICD-10-CM | POA: Diagnosis not present

## 2023-12-29 DIAGNOSIS — J40 Bronchitis, not specified as acute or chronic: Secondary | ICD-10-CM | POA: Diagnosis not present

## 2023-12-29 DIAGNOSIS — R059 Cough, unspecified: Secondary | ICD-10-CM | POA: Diagnosis not present

## 2023-12-29 DIAGNOSIS — I700 Atherosclerosis of aorta: Secondary | ICD-10-CM | POA: Diagnosis not present

## 2023-12-29 DIAGNOSIS — J471 Bronchiectasis with (acute) exacerbation: Secondary | ICD-10-CM | POA: Diagnosis not present

## 2023-12-29 MED ORDER — DOXYCYCLINE HYCLATE 100 MG PO TABS: 100.0000 mg | ORAL_TABLET | Freq: Two times a day (BID) | ORAL | 0 refills | Status: DC

## 2023-12-29 NOTE — Assessment & Plan Note (Signed)
 URI symptoms that started about 2 weeks ago, now with increased cough, sputum production.  Suspect a flare of her bronchiectasis.  She has been managing her rhinitis adequately with Flonase .  I will treat her with doxycycline .  Check a chest x-ray today to ensure that she is not developing an overt pneumonia.  She will call us  if she is not improving so we can see her sooner.  Otherwise follow-up in 2 months.    Chest x-ray today Please take doxycycline  as directed until completely gone. Continue your Flonase  nasal spray as you have been taking it Continue your omeprazole  as needed Follow in our office in about 2 months so we can assess your progress. If you are not improving in about 1 week please message us  so we can see you sooner.

## 2023-12-29 NOTE — Progress Notes (Signed)
 Subjective:    Patient ID: Brandy Peterson, female    DOB: [birth year], 89 y.o.   MRN: [id number]  HPI   ROV 01/21/22 -- 89 yo woman, former smoker, suspected mild COPD. Have followed her for globus sensation / cough, RLL pulm nodule and bronchiectasis on serial imaging. She is doing well, no cough currently. She does have some exertional SOB when she is lifting something heavy, resolves w rest. Happens with chores, lifting garbage etc. She still does yard work. No wheeze.   LDCT 12/26/21 reviewed by me shows no suspicious nodules, no change minor fissure small nodule.  She did have aortic and coronary calcifications.   Acute office visit 06/23/23 --89 year old woman with mild COPD, former tobacco use, hemochromatosis, bronchiectasis and history of nodular disease (nodule resolved on lung cancer screening CT 12/2021).  She has a chronic globus sensation, chronic cough. She is on nasal steroid, sometimes uses nasal saline.  She began to have a scratchy throat last week. Then developed some URI sx, head congestion. She does not believe this is her allergy. No fever.  Now cough coughing up clear to white mucous, rarely colored. She is coughing at night, hears noise in her chest   ROV 12/29/2023 --Brandy Peterson is 47 with a history of former tobacco use and suspected mild COPD, upper airway irritation syndrome and chronic cough, bronchiectasis on CT chest with a right lower lobe pulmonary nodule.  I saw her 6 months ago in the setting of suspected community-acquired pneumonia, treated with an extended course of doxycycline .  She reports today that she has been well until about 2 weeks ago when she developed scratchy throat, significant increase in PND, some cough. She thinks she may have caught URI. She was not tested for covid. She feels some low energy, has some increased SOB. She remains on flonase , uses omeprazole  prn.    Review of Systems As per HPi  Past Medical History:  Diagnosis Date   Carcinoma of  breast (HCC)    Right   Complication of anesthesia    was told to never use Sodium Pentothal - mother had reaction. Also had trouble with intubation during mastectomy   COPD (chronic obstructive pulmonary disease) (HCC)    Family history of adverse reaction to anesthesia    GERD (gastroesophageal reflux disease)    Hemochromatosis    History of shingles    Hypertension    Hypothyroidism    IBS (irritable bowel syndrome)    Plantar fasciitis    Pneumonia    Raynaud's syndrome    Spastic colon    hx of    Thyroid  disease    Varicose veins      Family History  Problem Relation Age of Onset   Cancer Mother        metastatic cancer (pancreas vs lung)   Hypertension Mother    Osteoarthritis Mother    Hypothyroidism Mother    Varicose Veins Mother    Bleeding Disorder Mother    Heart disease Father 67       CAD/MI   Hypertension Father    Arthritis Father    Hemochromatosis Father        Possible   Deep vein thrombosis Father    Varicose Veins Father    Heart attack Father    Heart attack Brother 76   Hemochromatosis Brother    Heart disease Brother    Hyperlipidemia Son    Diabetes Neg Hx      Social  History   Socioeconomic History   Marital status: Widowed    Spouse name: Not on file   Number of children: 1   Years of education: 18   Highest education level: 12th grade  Occupational History   Occupation: Retired  Tobacco Use   Smoking status: Former    Current packs/day: 0.00    Types: Cigarettes    Quit date: 04/13/1988    Years since quitting: 35.7   Smokeless tobacco: Never   Tobacco comments:    approximate quit date  Vaping Use   Vaping status: Never Used  Substance and Sexual Activity   Alcohol use: Yes    Alcohol/week: 3.0 standard drinks of alcohol    Types: 3 Glasses of wine per week    Comment: 2oz of wine occasionally   Drug use: No   Sexual activity: Never  Other Topics Concern   Not on file  Social History Narrative   Recruitment consultant, 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. Pt married in 1953 and was widowed in 2002. She now lives alone I-ADLs. Pt has 1 son born 60 and 2 grandchildren.       Pt had a colonoscopy on 05/09/2012 by Dr Celestia.       Pt reports having a pneumo vaccine.      Right-handed.   4-5 cups caffeine daily.   Lives alone.   Social Drivers of Corporate investment banker Strain: Low Risk  (08/18/2023)   Overall Financial Resource Strain (CARDIA)    Difficulty of Paying Living Expenses: Not hard at all  Food Insecurity: No Food Insecurity (08/18/2023)   Hunger Vital Sign    Worried About Running Out of Food in the Last Year: Never true    Ran Out of Food in the Last Year: Never true  Transportation Needs: No Transportation Needs (08/18/2023)   PRAPARE - Administrator, Civil Service (Medical): No    Lack of Transportation (Non-Medical): No  Physical Activity: Unknown (08/18/2023)   Exercise Vital Sign    Days of Exercise per Week: Patient declined    Minutes of Exercise per Session: 20 min  Stress: No Stress Concern Present (08/18/2023)   [employer] of Occupational Health - Occupational Stress Questionnaire    Feeling of Stress : Not at all  Social Connections: Moderately Integrated (08/18/2023)   Social Connection and Isolation Panel    Frequency of Communication with Friends and Family: Three times a week    Frequency of Social Gatherings with Friends and Family: Once a week    Attends Religious Services: More than 4 times per year    Active Member of [employer] or Organizations: Yes    Attends Banker Meetings: More than 4 times per year    Marital Status: Widowed  Intimate Partner Violence: Not on file    Has always lived in [place] 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   Allergies  Allergen Reactions   Contrast Media [Iodinated Contrast Media] Hives, Itching and Other (See Comments)    Felt like she was going to faint    Adhesive [Tape] Other (See  Comments)    Prefers paper tape   Sulfonamide Derivatives Other (See Comments)    Reaction: unknown      Outpatient Medications Prior to Visit  Medication Sig Dispense Refill   fluticasone  (FLONASE ) 50 MCG/ACT nasal spray Place 2 sprays into both nostrils daily. 16 g 6   HYDROcodone -acetaminophen  (NORCO/VICODIN) 5-325 MG tablet 1 tab  by mouth once per day as needed for severe pain 10 tablet 0   levothyroxine  (SYNTHROID ) 88 MCG tablet Take 1 tablet (88 mcg total) by mouth daily before breakfast. 90 tablet 3   metoprolol  succinate (TOPROL -XL) 25 MG 24 hr tablet Take 1 tablet (25 mg total) by mouth daily. 90 tablet 3   omeprazole  (PRILOSEC) 40 MG capsule Take 1 capsule (40 mg total) by mouth daily. 90 capsule 3   triamterene -hydrochlorothiazide  (MAXZIDE -25) 37.5-25 MG tablet Take 1 tablet by mouth daily. 90 tablet 3   hydrALAZINE  (APRESOLINE ) 10 MG tablet Take 1 tablet (10 mg total) by mouth 2 (two) times daily. (Patient not taking: Reported on 08/19/2023) 180 tablet 3   No facility-administered medications prior to visit.         Objective:   Physical Exam Vitals:   12/29/23 1116  BP: (!) 142/70  Pulse: 60  Temp: 97.7 F (36.5 C)  TempSrc: Oral  SpO2: 99%  Weight: 108 lb 12.8 oz (49.4 kg)  Height: 5' 1 (1.549 m)   Gen: Pleasant, thin elderly woman, in no distress,  normal affect  ENT: No lesions,  mouth clear,  oropharynx clear, no postnasal drip  Neck: No JVD, no stridor  Lungs: No use of accessory muscles, she has focal rhonchi on insp and exp L mid lung field.   Cardiovascular: RRR, heart sounds normal, no murmur or gallops, no peripheral edema  Musculoskeletal: No deformities, no cyanosis or clubbing  Neuro: alert, awake, non focal  Skin: Warm, no lesions or rash     Assessment & Plan:  Bronchiectasis with (acute) exacerbation (HCC) URI symptoms that started about 2 weeks ago, now with increased cough, sputum production.  Suspect a flare of her bronchiectasis.   She has been managing her rhinitis adequately with Flonase .  I will treat her with doxycycline .  Check a chest x-ray today to ensure that she is not developing an overt pneumonia.  She will call us  if she is not improving so we can see her sooner.  Otherwise follow-up in 2 months.    Chest x-ray today Please take doxycycline  as directed until completely gone. Continue your Flonase  nasal spray as you have been taking it Continue your omeprazole  as needed Follow in our office in about 2 months so we can assess your progress. If you are not improving in about 1 week please message us  so we can see you sooner.      Lamar Chris, MD, PhD 12/29/2023, 11:38 AM [facility] [phone] or if no answer [phone]

## 2023-12-29 NOTE — Patient Instructions (Signed)
 Chest x-ray today Please take doxycycline  as directed until completely gone. Continue your Flonase  nasal spray as you have been taking it Continue your omeprazole  as needed Follow in our office in about 2 months so we can assess your progress. If you are not improving in about 1 week please message us  so we can see you sooner.

## 2023-12-31 NOTE — Progress Notes (Signed)
 ATC x1. No answer- left vm to call back.

## 2024-01-01 ENCOUNTER — Encounter: Payer: Self-pay | Admitting: Emergency Medicine

## 2024-01-03 NOTE — Progress Notes (Signed)
 Taken care of in Mychart encounter. Nfn

## 2024-02-04 DIAGNOSIS — Z23 Encounter for immunization: Secondary | ICD-10-CM | POA: Diagnosis not present

## 2024-02-21 ENCOUNTER — Ambulatory Visit: Payer: Self-pay | Admitting: Internal Medicine

## 2024-02-21 ENCOUNTER — Ambulatory Visit (INDEPENDENT_AMBULATORY_CARE_PROVIDER_SITE_OTHER): Admitting: Internal Medicine

## 2024-02-21 ENCOUNTER — Encounter: Payer: Self-pay | Admitting: Internal Medicine

## 2024-02-21 VITALS — BP 130/76 | HR 62 | Temp 98.0°F | Ht 61.0 in | Wt 109.0 lb

## 2024-02-21 DIAGNOSIS — E7800 Pure hypercholesterolemia, unspecified: Secondary | ICD-10-CM | POA: Diagnosis not present

## 2024-02-21 DIAGNOSIS — R739 Hyperglycemia, unspecified: Secondary | ICD-10-CM

## 2024-02-21 DIAGNOSIS — H6123 Impacted cerumen, bilateral: Secondary | ICD-10-CM

## 2024-02-21 DIAGNOSIS — E039 Hypothyroidism, unspecified: Secondary | ICD-10-CM | POA: Diagnosis not present

## 2024-02-21 DIAGNOSIS — I10 Essential (primary) hypertension: Secondary | ICD-10-CM

## 2024-02-21 DIAGNOSIS — E559 Vitamin D deficiency, unspecified: Secondary | ICD-10-CM | POA: Diagnosis not present

## 2024-02-21 LAB — URINALYSIS, ROUTINE W REFLEX MICROSCOPIC
Bilirubin Urine: NEGATIVE
Hgb urine dipstick: NEGATIVE
Ketones, ur: NEGATIVE
Leukocytes,Ua: NEGATIVE
Nitrite: NEGATIVE
RBC / HPF: NONE SEEN (ref 0–?)
Specific Gravity, Urine: 1.01 (ref 1.000–1.030)
Total Protein, Urine: NEGATIVE
Urine Glucose: NEGATIVE
Urobilinogen, UA: 0.2 (ref 0.0–1.0)
WBC, UA: NONE SEEN (ref 0–?)
pH: 7.5 (ref 5.0–8.0)

## 2024-02-21 LAB — CBC WITH DIFFERENTIAL/PLATELET
Basophils Absolute: 0 K/uL (ref 0.0–0.1)
Basophils Relative: 0.2 % (ref 0.0–3.0)
Eosinophils Absolute: 0.2 K/uL (ref 0.0–0.7)
Eosinophils Relative: 2.6 % (ref 0.0–5.0)
HCT: 42.9 % (ref 36.0–46.0)
Hemoglobin: 14.8 g/dL (ref 12.0–15.0)
Lymphocytes Relative: 17.5 % (ref 12.0–46.0)
Lymphs Abs: 1.3 K/uL (ref 0.7–4.0)
MCHC: 34.6 g/dL (ref 30.0–36.0)
MCV: 94.8 fl (ref 78.0–100.0)
Monocytes Absolute: 0.6 K/uL (ref 0.1–1.0)
Monocytes Relative: 8.3 % (ref 3.0–12.0)
Neutro Abs: 5.2 K/uL (ref 1.4–7.7)
Neutrophils Relative %: 71.4 % (ref 43.0–77.0)
Platelets: 257 K/uL (ref 150.0–400.0)
RBC: 4.53 Mil/uL (ref 3.87–5.11)
RDW: 12.8 % (ref 11.5–15.5)
WBC: 7.3 K/uL (ref 4.0–10.5)

## 2024-02-21 LAB — HEPATIC FUNCTION PANEL
ALT: 17 U/L (ref 0–35)
AST: 25 U/L (ref 0–37)
Albumin: 4.3 g/dL (ref 3.5–5.2)
Alkaline Phosphatase: 101 U/L (ref 39–117)
Bilirubin, Direct: 0.1 mg/dL (ref 0.0–0.3)
Total Bilirubin: 0.6 mg/dL (ref 0.2–1.2)
Total Protein: 7.2 g/dL (ref 6.0–8.3)

## 2024-02-21 LAB — BASIC METABOLIC PANEL WITH GFR
BUN: 17 mg/dL (ref 6–23)
CO2: 29 meq/L (ref 19–32)
Calcium: 9.4 mg/dL (ref 8.4–10.5)
Chloride: 96 meq/L (ref 96–112)
Creatinine, Ser: 0.69 mg/dL (ref 0.40–1.20)
GFR: 76.03 mL/min (ref >60.00)
Glucose, Bld: 83 mg/dL (ref 70–99)
Potassium: 4.2 meq/L (ref 3.5–5.1)
Sodium: 133 meq/L — ABNORMAL LOW (ref 135–145)

## 2024-02-21 NOTE — Progress Notes (Signed)
 Patient ID: Brandy Peterson, female   DOB: [birth year], 89 y.o.   MRN: [id number]        Chief Complaint: follow up low vit d, low thyroid , hyperglycemia, hld, htn       HPI:  Brandy Peterson is a 89 y.o. female here overall doing ok; Pt denies chest pain, increased sob or doe, wheezing, orthopnea, PND, increased LE swelling, palpitations, dizziness or syncope.   Pt denies polydipsia, polyuria, or new focal neuro s/s.    Pt denies fever, wt loss, night sweats, loss of appetite, or other constitutional symptoms     Recently saw pulm for doxycycline  course, and cxr with copd, biapical scarring.  Brother with lymphoma, recurring melanoma, so more stress recently.  Denies worsening depressive symptoms, suicidal ideation, or panic.  Does have bilateral cerumen impaction noted today, but pt does not want irrigation, prefers referral to ENT.  Also asks for thyroid  panel given recent hair loss and thinning.  Does have mild recurring constipation.  Conts to treat hemochromatosis as indicated with blood donation.    Wt Readings from Last 3 Encounters:  02/21/24 109 lb (49.4 kg)  12/29/23 108 lb 12.8 oz (49.4 kg)  08/19/23 113 lb (51.3 kg)   BP Readings from Last 3 Encounters:  02/21/24 130/76  12/29/23 (!) 142/70  12/24/23 (!) 138/58         Past Medical History:  Diagnosis Date   Carcinoma of breast (HCC)    Right   Complication of anesthesia    was told to never use Sodium Pentothal - mother had reaction. Also had trouble with intubation during mastectomy   COPD (chronic obstructive pulmonary disease) (HCC)    Family history of adverse reaction to anesthesia    GERD (gastroesophageal reflux disease)    Hemochromatosis    History of shingles    Hypertension    Hypothyroidism    IBS (irritable bowel syndrome)    Plantar fasciitis    Pneumonia    Raynaud's syndrome    Spastic colon    hx of    Thyroid  disease    Varicose veins    Past Surgical History:  Procedure Laterality Date   ABDOMINAL  HYSTERECTOMY  1979   PARTIAL   APPENDECTOMY     BONE SPUR  1970   BOTH FEET   CATARACT EXTRACTION  10/2012 and 12/2012   bilateral with lens implants   COLONOSCOPY     KYPHOPLASTY N/A 10/19/2014   Procedure: Lumbar 1 kyphoplasty;  Surgeon: Lamar Peaches, MD;  Location: [facility];  Service: Neurosurgery;  Laterality: N/A;   LUMBAR LAMINECTOMY  1991   L5-S1   MASTECTOMY  1994   RIGHT   MASTECTOMY  1994   Right    reports that she quit smoking about 35 years ago. Her smoking use included cigarettes. She has never used smokeless tobacco. She reports current alcohol use of about 3.0 standard drinks of alcohol per week. She reports that she does not use drugs. family history includes Arthritis in her father; Bleeding Disorder in her mother; Cancer in her mother; Deep vein thrombosis in her father; Heart attack in her father; Heart attack (age of onset: 20) in her brother; Heart disease in her brother; Heart disease (age of onset: 83) in her father; Hemochromatosis in her brother and father; Hyperlipidemia in her son; Hypertension in her father and mother; Hypothyroidism in her mother; Osteoarthritis in her mother; Varicose Veins in her father and mother. Allergies  Allergen Reactions  Contrast Media [Iodinated Contrast Media] Hives, Itching and Other (See Comments)    Felt like she was going to faint    Adhesive [Tape] Other (See Comments)    Prefers paper tape   Sulfonamide Derivatives Other (See Comments)    Reaction: unknown    Current Outpatient Medications on File Prior to Visit  Medication Sig Dispense Refill   fluticasone  (FLONASE ) 50 MCG/ACT nasal spray Place 2 sprays into both nostrils daily. 16 g 6   HYDROcodone -acetaminophen  (NORCO/VICODIN) 5-325 MG tablet 1 tab by mouth once per day as needed for severe pain 10 tablet 0   levothyroxine  (SYNTHROID ) 88 MCG tablet Take 1 tablet (88 mcg total) by mouth daily before breakfast. 90 tablet 3   metoprolol  succinate (TOPROL -XL) 25 MG  24 hr tablet Take 1 tablet (25 mg total) by mouth daily. 90 tablet 3   omeprazole  (PRILOSEC) 40 MG capsule Take 1 capsule (40 mg total) by mouth daily. 90 capsule 3   triamterene -hydrochlorothiazide  (MAXZIDE -25) 37.5-25 MG tablet Take 1 tablet by mouth daily. 90 tablet 3   No current facility-administered medications on file prior to visit.        ROS:  All others reviewed and negative.  Objective        PE:  BP 130/76 (BP Location: Right Arm, Patient Position: Sitting, Cuff Size: Normal)   Pulse 62   Temp 98 F (36.7 C) (Oral)   Ht 5' 1 (1.549 m)   Wt 109 lb (49.4 kg)   SpO2 95%   BMI 20.60 kg/m                 Constitutional: Pt appears in NAD               HENT: Head: NCAT.                Right Ear: External ear normal.                 Left Ear: External ear normal.                Eyes: . Pupils are equal, round, and reactive to light. Conjunctivae and EOM are normal               Nose: without d/c or deformity               Neck: Neck supple. Gross normal ROM               Cardiovascular: Normal rate and regular rhythm.                 Pulmonary/Chest: Effort normal and breath sounds without rales or wheezing.                Abd:  Soft, NT, ND, + BS, no organomegaly               Neurological: Pt is alert. At baseline orientation, motor grossly intact               Skin: Skin is warm. No rashes, no other new lesions, LE edema - none               Psychiatric: Pt behavior is normal without agitation   Micro: none  Cardiac tracings I have personally interpreted today:  none  Pertinent Radiological findings (summarize): none   Lab Results  Component Value Date   WBC 7.3 02/21/2024   HGB 14.8 02/21/2024   HCT 42.9 02/21/2024  PLT 257.0 02/21/2024   GLUCOSE 83 02/21/2024   CHOL 210 (H) 08/19/2023   TRIG 73.0 08/19/2023   HDL 71.50 08/19/2023   LDLDIRECT 93.6 12/26/2010   LDLCALC 124 (H) 08/19/2023   ALT 17 02/21/2024   AST 25 02/21/2024   NA 133 (L) 02/21/2024    K 4.2 02/21/2024   CL 96 02/21/2024   CREATININE 0.69 02/21/2024   BUN 17 02/21/2024   CO2 29 02/21/2024   TSH 2.84 08/19/2023   HGBA1C 5.0 08/19/2023   Assessment/Plan:  Brandy Peterson is a 89 y.o. White or Caucasian [1] female with  has a past medical history of Carcinoma of breast (HCC), Complication of anesthesia, COPD (chronic obstructive pulmonary disease) (HCC), Family history of adverse reaction to anesthesia, GERD (gastroesophageal reflux disease), Hemochromatosis, History of shingles, Hypertension, Hypothyroidism, IBS (irritable bowel syndrome), Plantar fasciitis, Pneumonia, Raynaud's syndrome, Spastic colon, Thyroid  disease, and Varicose veins.  Vitamin D  deficiency Last vitamin D  Lab Results  Component Value Date   VD25OH 37.02 08/19/2023   Low, to start oral replacement   Hypothyroidism Lab Results  Component Value Date   TSH 2.84 08/19/2023   Stable, pt to continue levothyroxine  88 mcg every day, for f/u lab panel today   Hyperglycemia Lab Results  Component Value Date   HGBA1C 5.0 08/19/2023   Stable, pt to continue current medical treatment  - diet, wt control   HLD (hyperlipidemia) Lab Results  Component Value Date   LDLCALC 124 (H) 08/19/2023   Uncontrolled, pt declines statin or other today, for lower chol diet   Essential hypertension BP Readings from Last 3 Encounters:  02/21/24 130/76  12/29/23 (!) 142/70  12/24/23 (!) 138/58   Stable, pt to continue medical treatment toprol  xl 25 every day, maxide 1 every day,    Bilateral impacted cerumen With some reduced hearing likely it seems, now for ENT referral per pt reqeust  Followup: Return in about 6 months (around 08/20/2024).  Lynwood Rush, MD 02/21/2024 8:47 PM [facility]

## 2024-02-21 NOTE — Assessment & Plan Note (Signed)
 Last vitamin D  Lab Results  Component Value Date   VD25OH 37.02 08/19/2023   Low, to start oral replacement

## 2024-02-21 NOTE — Assessment & Plan Note (Signed)
 Lab Results  Component Value Date   LDLCALC 124 (H) 08/19/2023   Uncontrolled, pt declines statin or other today, for lower chol diet

## 2024-02-21 NOTE — Patient Instructions (Signed)
 Please continue all other medications as before, and refills have been done if requested.  Please have the pharmacy call with any other refills you may need.  Please continue your efforts at being more active, low cholesterol diet, and weight control.  You are otherwise up to date with prevention measures today.  Please keep your appointments with your specialists as you may have planned  You will be contacted regarding the referral for: ENT for the ears  Please go to the LAB at the blood drawing area for the tests to be done, including the thyroid  panel  You will be contacted by phone if any changes need to be made immediately.  Otherwise, you will receive a letter about your results with an explanation, but please check with MyChart first.  Please make an Appointment to return in 6 months, or sooner if needed

## 2024-02-21 NOTE — Assessment & Plan Note (Signed)
 With some reduced hearing likely it seems, now for ENT referral per pt reqeust

## 2024-02-21 NOTE — Progress Notes (Signed)
 The test results show that your current treatment is OK, as the tests are stable.  Please continue the same plan.  There is no other need for change of treatment or further evaluation based on these results, at this time.  thanks

## 2024-02-21 NOTE — Assessment & Plan Note (Signed)
 Lab Results  Component Value Date   TSH 2.84 08/19/2023   Stable, pt to continue levothyroxine  88 mcg every day, for f/u lab panel today

## 2024-02-21 NOTE — Assessment & Plan Note (Signed)
 Lab Results  Component Value Date   HGBA1C 5.0 08/19/2023   Stable, pt to continue current medical treatment  - diet, wt control

## 2024-02-21 NOTE — Assessment & Plan Note (Signed)
 BP Readings from Last 3 Encounters:  02/21/24 130/76  12/29/23 (!) 142/70  12/24/23 (!) 138/58   Stable, pt to continue medical treatment toprol  xl 25 every day, maxide 1 every day,

## 2024-02-24 LAB — THYROID PANEL WITH TSH
Free Thyroxine Index: 2.5 (ref 1.4–3.8)
T3 Uptake: 30 % (ref 22–35)
T4, Total: 8.3 ug/dL (ref 5.1–11.9)
TSH: 3.32 m[IU]/L (ref 0.40–4.50)

## 2024-02-28 ENCOUNTER — Encounter: Payer: Self-pay | Admitting: Adult Health

## 2024-02-28 ENCOUNTER — Ambulatory Visit: Admitting: Adult Health

## 2024-02-28 VITALS — BP 160/84 | HR 57 | Temp 97.6°F | Ht 61.0 in | Wt 109.8 lb

## 2024-02-28 DIAGNOSIS — J449 Chronic obstructive pulmonary disease, unspecified: Secondary | ICD-10-CM

## 2024-02-28 DIAGNOSIS — K219 Gastro-esophageal reflux disease without esophagitis: Secondary | ICD-10-CM

## 2024-02-28 DIAGNOSIS — J479 Bronchiectasis, uncomplicated: Secondary | ICD-10-CM

## 2024-02-28 DIAGNOSIS — J309 Allergic rhinitis, unspecified: Secondary | ICD-10-CM | POA: Diagnosis not present

## 2024-02-28 DIAGNOSIS — Z23 Encounter for immunization: Secondary | ICD-10-CM | POA: Diagnosis not present

## 2024-02-28 NOTE — Progress Notes (Signed)
 @Patient  ID: Brandy Peterson, female    DOB: 01-01-1931, 89 y.o.   MRN: [id number]  Chief Complaint  Patient presents with   Follow-up    Bronchitis and Bronchiectasis f/u    Referring provider: Norleen Lynwood ORN, MD  HPI: 89 year old female former smoker followed for suspected mild COPD, chronic cough, bronchiectasis and right lower lobe pulmonary nodule Medical history significant for breast cancer   TEST/EVENTS : Reviewed 02/28/2024  Chest x-ray December 29, 2023 showed emphysema and chronic biapical pleural-parenchymal scarring  Discussed the use of AI scribe software for clinical note transcription with the patient, who gave verbal consent to proceed.  History of Present Illness Brandy Peterson is a 89 year old female with bronchiectasis, and suspected mild  COPD who presents for follow-up of her respiratory conditions.  Her breathing is generally stable, though she does not breathe deeply and experiences shortness of breath with exertion, such as pushing a heavy garbage can. Despite this, she remains active, continues to drive, and lives independently.  Two months ago, she experienced an acute bronchitis that was treated with antibiotics and has since resolved. She now experiences morning hoarseness and throat clearing, which she attributes to allergies and drainage. She uses Flonase  daily and occasionally takes Claritin D. She also uses Prilosec as needed for reflux symptoms.  This developed with significant episode of nasal drainage and cough lasting three weeks, which she attributes to a virus contracted from her grandson. An x-ray at that time ruled out pneumonia, showed chronic emphysema and pleural parenchymal scarring.  She has been vaccinated against the flu and plans to receive the COVID vaccine soon. She has not yet received the RSV vaccine.  She has a history of hypertension and takes medication for fluid retention, which she has been on since her thirties. She notes  difficulty controlling her blood pressure despite medication.     Allergies  Allergen Reactions   Contrast Media [Iodinated Contrast Media] Hives, Itching and Other (See Comments)    Felt like she was going to faint    Adhesive [Tape] Other (See Comments)    Prefers paper tape   Sulfonamide Derivatives Other (See Comments)    Reaction: unknown     Immunization History  Administered Date(s) Administered   Fluad Quad(high Dose 65+) 12/14/2018, 12/12/2019   H1N1 03/21/2008   INFLUENZA, HIGH DOSE SEASONAL PF 01/21/2016, 01/21/2017, 01/07/2018, 02/04/2024   Influenza Split 12/29/2011   Influenza Whole 12/24/2009   Influenza,inj,Quad PF,6+ Mos 01/16/2014   Influenza-Unspecified 01/02/2013, 03/11/2015, 12/14/2018, 01/19/2022, 02/05/2023   Moderna Covid-19 Vaccine  Bivalent Booster 1yrs & up 02/14/2021   Moderna Sars-Covid-2 Vaccination 05/04/2019, 06/09/2019, 02/05/2020, 10/09/2020   Pneumococcal Conjugate-13 05/02/2013   Pneumococcal Polysaccharide-23 01/25/2002   Tdap 04/24/2005, 04/05/2015   Unspecified SARS-COV-2 Vaccination 01/09/2023    Past Medical History:  Diagnosis Date   Carcinoma of breast (HCC)    Right   Complication of anesthesia    was told to never use Sodium Pentothal - mother had reaction. Also had trouble with intubation during mastectomy   COPD (chronic obstructive pulmonary disease) (HCC)    Family history of adverse reaction to anesthesia    GERD (gastroesophageal reflux disease)    Hemochromatosis    History of shingles    Hypertension    Hypothyroidism    IBS (irritable bowel syndrome)    Plantar fasciitis    Pneumonia    Raynaud's syndrome    Spastic colon    hx of    Thyroid  disease  Varicose veins     Tobacco History: Social History   Tobacco Use  Smoking Status Former   Current packs/day: 0.00   Types: Cigarettes   Quit date: 04/13/1988   Years since quitting: 35.9  Smokeless Tobacco Never  Tobacco Comments   approximate quit  date   Counseling given: Not Answered Tobacco comments: approximate quit date   Outpatient Medications Prior to Visit  Medication Sig Dispense Refill   fluticasone  (FLONASE ) 50 MCG/ACT nasal spray Place 2 sprays into both nostrils daily. 16 g 6   HYDROcodone -acetaminophen  (NORCO/VICODIN) 5-325 MG tablet 1 tab by mouth once per day as needed for severe pain 10 tablet 0   levothyroxine  (SYNTHROID ) 88 MCG tablet Take 1 tablet (88 mcg total) by mouth daily before breakfast. 90 tablet 3   metoprolol  succinate (TOPROL -XL) 25 MG 24 hr tablet Take 1 tablet (25 mg total) by mouth daily. 90 tablet 3   omeprazole  (PRILOSEC) 40 MG capsule Take 1 capsule (40 mg total) by mouth daily. 90 capsule 3   triamterene -hydrochlorothiazide  (MAXZIDE -25) 37.5-25 MG tablet Take 1 tablet by mouth daily. 90 tablet 3   No facility-administered medications prior to visit.     Review of Systems:   Constitutional:   No  weight loss, night sweats,  Fevers, chills, fatigue, or  lassitude.  HEENT:   No headaches,  Difficulty swallowing,  Tooth/dental problems, or  Sore throat,                No sneezing, itching, ear ache, +nasal congestion, post nasal drip,   CV:  No chest pain,  Orthopnea, PND, swelling in lower extremities, anasarca, dizziness, palpitations, syncope.   GI  No heartburn, indigestion, abdominal pain, nausea, vomiting, diarrhea, change in bowel habits, loss of appetite, bloody stools.   Resp:   No excess mucus, no productive cough,  No non-productive cough,  No coughing up of blood.  No change in color of mucus.  No wheezing.  No chest wall deformity  Skin: no rash or lesions.  GU: no dysuria, change in color of urine, no urgency or frequency.  No flank pain, no hematuria   MS:  No joint pain or swelling.  No decreased range of motion.  No back pain.    Physical Exam  BP (!) 160/84   Pulse (!) 57   Temp 97.6 F (36.4 C)   Ht 5' 1 (1.549 m) Comment: PER PT  Wt 109 lb 12.8 oz (49.8 kg)    SpO2 96% Comment: RA  BMI 20.75 kg/m   GEN: A/Ox3; pleasant , NAD, well nourished    HEENT:  [place]/AT,   NOSE-clear, THROAT-clear, no lesions, no postnasal drip or exudate noted.   NECK:  Supple w/ fair ROM; no JVD; normal carotid impulses w/o bruits; no thyromegaly or nodules palpated; no lymphadenopathy.    RESP  Clear  P & A; w/o, wheezes/ rales/ or rhonchi. no accessory muscle use, no dullness to percussion  CARD:  RRR, no m/r/g, no peripheral edema, pulses intact, no cyanosis or clubbing.  GI:   Soft & nt; nml bowel sounds; no organomegaly or masses detected.   Musco: Warm bil, no deformities or joint swelling noted.   Neuro: alert, no focal deficits noted.    Skin: Warm, no lesions or rashes    Lab Results:Reviewed 02/28/2024   CBC    Component Value Date/Time   WBC 7.3 02/21/2024 1134   RBC 4.53 02/21/2024 1134   HGB 14.8 02/21/2024 1134  HGB 14.8 12/17/2023 1205   HGB 14.4 03/23/2017 1409   HCT 42.9 02/21/2024 1134   HCT 41.4 03/23/2017 1409   PLT 257.0 02/21/2024 1134   PLT 241 12/17/2023 1205   PLT 232 03/23/2017 1409   MCV 94.8 02/21/2024 1134   MCV 93.7 03/23/2017 1409   MCH 32.0 12/17/2023 1205   MCHC 34.6 02/21/2024 1134   RDW 12.8 02/21/2024 1134   RDW 12.6 03/23/2017 1409   LYMPHSABS 1.3 02/21/2024 1134   LYMPHSABS 1.1 03/23/2017 1409   MONOABS 0.6 02/21/2024 1134   MONOABS 0.6 03/23/2017 1409   EOSABS 0.2 02/21/2024 1134   EOSABS 0.3 03/23/2017 1409   BASOSABS 0.0 02/21/2024 1134   BASOSABS 0.0 03/23/2017 1409    BMET    Component Value Date/Time   NA 133 (L) 02/21/2024 1134   NA 139 03/23/2017 1415   K 4.2 02/21/2024 1134   K 3.4 (L) 03/23/2017 1415   CL 96 02/21/2024 1134   CL 100 05/13/2012 1037   CO2 29 02/21/2024 1134   CO2 27 03/23/2017 1415   GLUCOSE 83 02/21/2024 1134   GLUCOSE 83 03/23/2017 1415   GLUCOSE 88 05/13/2012 1037   BUN 17 02/21/2024 1134   BUN 11.2 03/23/2017 1415   CREATININE 0.69 02/21/2024 1134   CREATININE  0.65 12/17/2023 1205   CREATININE 0.8 03/23/2017 1415   CALCIUM 9.4 02/21/2024 1134   CALCIUM 9.1 03/23/2017 1415   GFRNONAA >60 12/17/2023 1205   GFRAA >60 10/17/2019 1049    BNP    Component Value Date/Time   BNP 138.6 (H) 02/13/2022 1410    ProBNP No results found for: PROBNP  Imaging: No results found.  Administration History     None           No data to display          No results found for: NITRICOXIDE      No data to display              Assessment & Plan:   Assessment and Plan Assessment & Plan Chronic obstructive pulmonary disease (COPD) with emphysema and bronchiectasis   COPD with emphysema and bronchiectasis -recent flare now resolved. Minimal symptoms -hold on inhalers at this time.   A previous CT scan showed bronchiectasis with mucus trapping. Current management focuses on preventing exacerbations and complications such as pneumonia. Continue the current management regimen. Use Robitussin DM as needed for cough during viral infections. Maintain good hand hygiene   Biapical pleural parenchymal scarring of lungs  -stable on recent chest xray.   Recent imaging shows no acute pathology No further imaging is indicated at this time.  Allergic rhinitis  -improved Chronic allergic rhinitis presents with morning hoarseness and drainage. It is managed with Flonase  and occasional Claritin, despite advice against its decongestant component. Continue Flonase  once daily and use Claritin  as needed for symptom relief.  Gastroesophageal reflux disease (GERD)   Intermittent GERD symptoms, particularly at night, are managed with as-needed use of omeprazole . Continue omeprazole  as needed for reflux symptoms.  Hypertension   Chronic hypertension is difficult to control.  Continue the current antihypertensive regimen.  Follow-up with primary care          Patches Mcdonnell, NP 02/28/2024  I spent   minutes dedicated to the care of this patient  on the date of this encounter to include pre-visit review of records, face-to-face time with the patient discussing conditions above, post visit ordering of testing, clinical documentation with  the electronic health record, making appropriate referrals as documented, and communicating necessary findings to members of the patients care team.

## 2024-02-28 NOTE — Patient Instructions (Addendum)
 Continue your Flonase  nasal spray daily  Continue on Claritin daily As needed   Continue your omeprazole  as needed Robitussin DM 1 tsp every 6hr As needed   Covid vaccine and RSV vaccine  as discussed  Follow up with Dr. Shelah  in 1 year and As needed

## 2024-03-03 DIAGNOSIS — R350 Frequency of micturition: Secondary | ICD-10-CM | POA: Diagnosis not present

## 2024-03-03 DIAGNOSIS — N3281 Overactive bladder: Secondary | ICD-10-CM | POA: Diagnosis not present

## 2024-03-03 DIAGNOSIS — R351 Nocturia: Secondary | ICD-10-CM | POA: Diagnosis not present

## 2024-03-08 ENCOUNTER — Encounter (INDEPENDENT_AMBULATORY_CARE_PROVIDER_SITE_OTHER): Payer: Self-pay

## 2024-04-11 ENCOUNTER — Other Ambulatory Visit: Payer: Self-pay | Admitting: Internal Medicine

## 2024-04-11 MED ORDER — HYDROCODONE-ACETAMINOPHEN 5-325 MG PO TABS: ORAL_TABLET | ORAL | 0 refills | Status: AC

## 2024-04-11 NOTE — Telephone Encounter (Signed)
 Copied from CRM #[id number]. Topic: Clinical - Medication Refill >> Apr 11, 2024  2:06 PM Suzen RAMAN wrote: Medication: HYDROcodone -acetaminophen  (NORCO/VICODIN) 5-325 MG tablet   Has the patient contacted their pharmacy? Yes   This is the patient's preferred pharmacy:  [facility] [address] [place] - [address] [place] [address] Phone: [phone] Fax: [phone]  Is this the correct pharmacy for this prescription? Yes If no, delete pharmacy and type the correct one.   Has the prescription been filled recently? No  Is the patient out of the medication? Yes  Has the patient been seen for an appointment in the last year OR does the patient have an upcoming appointment? Yes  Can we respond through MyChart? Yes  Agent: Please be advised that Rx refills may take up to 3 business days. We ask that you follow-up with your pharmacy.

## 2024-04-26 ENCOUNTER — Ambulatory Visit (INDEPENDENT_AMBULATORY_CARE_PROVIDER_SITE_OTHER): Admitting: Otolaryngology

## 2024-04-26 ENCOUNTER — Encounter (INDEPENDENT_AMBULATORY_CARE_PROVIDER_SITE_OTHER): Payer: Self-pay | Admitting: Otolaryngology

## 2024-04-26 VITALS — BP 157/72 | HR 59 | Temp 97.2°F | Ht 61.0 in | Wt 108.0 lb

## 2024-04-26 DIAGNOSIS — R42 Dizziness and giddiness: Secondary | ICD-10-CM | POA: Diagnosis not present

## 2024-04-26 DIAGNOSIS — H6123 Impacted cerumen, bilateral: Secondary | ICD-10-CM

## 2024-04-26 NOTE — Progress Notes (Signed)
 CC: Recurrent dizziness  Discussed the use of AI scribe software for clinical note transcription with the patient, who gave verbal consent to proceed.  History of Present Illness Brandy Peterson is a 89 year old female who presents today complaining of recurrent dizziness and imbalance.  Over the past two weeks, she has developed new symptoms of dizziness and balance disturbance. She describes altered spatial perception, such as feeling as though she was driving downhill on a flat surface and uphill when moving forward. She experienced an episode while crawling on the floor and another episode requiring use of a cane to prevent falling. She denies true vertigo, stating the room did not spin, but endorses a sensation of being off balance and her perception being 'off.' She denies hearing loss, tinnitus, or otorrhea. She recalls a remote episode in [place] when she fell to the floor upon rising, but otherwise has had consistently good balance until the recent onset of symptoms.  She was also referred for ear cleaning due to significant bilateral cerumen impaction. She describes a sensation of fullness in both ears, more pronounced on the left, and notes that her ear felt as if it was pulling her to the left. She reports intermittent pain on the external ear, which she attributes to her sleeping position and use of hair accessories. She has a longstanding history of cerumen accumulation. She attributes her wax buildup to allergies.     Past Medical History:  Diagnosis Date   Carcinoma of breast (HCC)    Right   Complication of anesthesia    was told to never use Sodium Pentothal - mother had reaction. Also had trouble with intubation during mastectomy   COPD (chronic obstructive pulmonary disease) (HCC)    Family history of adverse reaction to anesthesia    GERD (gastroesophageal reflux disease)    Hemochromatosis    History of shingles    Hypertension    Hypothyroidism    IBS (irritable bowel  syndrome)    Plantar fasciitis    Pneumonia    Raynaud's syndrome    Spastic colon    hx of    Thyroid  disease    Varicose veins     Past Surgical History:  Procedure Laterality Date   ABDOMINAL HYSTERECTOMY  1979   PARTIAL   APPENDECTOMY     BONE SPUR  1970   BOTH FEET   CATARACT EXTRACTION  10/2012 and 12/2012   bilateral with lens implants   COLONOSCOPY     KYPHOPLASTY N/A 10/19/2014   Procedure: Lumbar 1 kyphoplasty;  Surgeon: Lamar Peaches, MD;  Location: [facility];  Service: Neurosurgery;  Laterality: N/A;   LUMBAR LAMINECTOMY  1991   L5-S1   MASTECTOMY  1994   RIGHT   MASTECTOMY  1994   Right    Family History  Problem Relation Age of Onset   Cancer Mother        metastatic cancer (pancreas vs lung)   Hypertension Mother    Osteoarthritis Mother    Hypothyroidism Mother    Varicose Veins Mother    Bleeding Disorder Mother    Heart disease Father 79       CAD/MI   Hypertension Father    Arthritis Father    Hemochromatosis Father        Possible   Deep vein thrombosis Father    Varicose Veins Father    Heart attack Father    Heart attack Brother 80   Hemochromatosis Brother  Heart disease Brother    Hyperlipidemia Son    Diabetes Neg Hx     Social History:  reports that she quit smoking about 36 years ago. Her smoking use included cigarettes. She has never used smokeless tobacco. She reports current alcohol use of about 3.0 standard drinks of alcohol per week. She reports that she does not use drugs.  Allergies: Allergies[1]  Prior to Admission medications  Medication Sig Start Date End Date Taking? Authorizing Provider  fluticasone  (FLONASE ) 50 MCG/ACT nasal spray Place 2 sprays into both nostrils daily. 08/19/23   Norleen Lynwood ORN, MD  HYDROcodone -acetaminophen  (NORCO/VICODIN) 5-325 MG tablet 1 tab by mouth once per day as needed for severe pain 04/11/24   Norleen Lynwood ORN, MD  levothyroxine  (SYNTHROID ) 88 MCG tablet Take 1 tablet (88 mcg total) by  mouth daily before breakfast. 08/19/23   Norleen Lynwood ORN, MD  metoprolol  succinate (TOPROL -XL) 25 MG 24 hr tablet Take 1 tablet (25 mg total) by mouth daily. 08/19/23   Norleen Lynwood ORN, MD  omeprazole  (PRILOSEC) 40 MG capsule Take 1 capsule (40 mg total) by mouth daily. 08/19/23   Norleen Lynwood ORN, MD  triamterene -hydrochlorothiazide  (MAXZIDE -25) 37.5-25 MG tablet Take 1 tablet by mouth daily. 08/19/23   Norleen Lynwood ORN, MD    Blood pressure (!) 157/72, pulse (!) 59, temperature (!) 97.2 F (36.2 C), height 5' 1 (1.549 m), weight 108 lb (49 kg), SpO2 96%. Exam: General: Communicates without difficulty, well nourished, no acute distress. Head: Normocephalic, no evidence injury, no tenderness, facial buttresses intact without stepoff. Face/sinus: No tenderness to palpation and percussion. Facial movement is normal and symmetric. Eyes: PERRL, EOMI. No scleral icterus, conjunctivae clear. Neuro: CN II exam reveals vision grossly intact.  No nystagmus at any point of gaze. Ears: Auricles well formed without lesions.  Bilateral cerumen impaction.  Nose: External evaluation reveals normal support and skin without lesions.  Dorsum is intact.  Anterior rhinoscopy reveals congested mucosa over anterior aspect of inferior turbinates and intact septum.  No purulence noted. Oral:  Oral cavity and oropharynx are intact, symmetric, without erythema or edema.  Mucosa is moist without lesions. Neck: Full range of motion without pain.  There is no significant lymphadenopathy.  No masses palpable.  Thyroid  bed within normal limits to palpation.  Parotid glands and submandibular glands equal bilaterally without mass.  Trachea is midline. Neuro:  CN 2-12 grossly intact. Vestibular: No nystagmus at any point of gaze. Dix Hallpike negative. Vestibular: There is no nystagmus with pneumatic pressure on either tympanic membrane or Valsalva. The cerebellar examination is unremarkable.  Discharge  Procedure: Bilateral cerumen  disimpaction Anesthesia: None Description: Under the operating microscope, the cerumen is carefully removed with a combination of cerumen currette, alligator forceps, and suction catheters.  After the cerumen is removed, the TMs are noted to be normal.  No mass, erythema, or lesions. The patient tolerated the procedure well.    Assessment & Plan Recurrent dizziness/balance disturbance New onset balance disturbance without vertigo, hearing loss, or tinnitus. No evidence of otitis media or otitis externa.  Her Dix-Hallpike maneuver is negative. - The possible differential diagnoses include transient BPPV, vestibular migraine, Meniere's disease, peripheral vestibular dysfunction, or other central/systemic causes.   - The pathophysiology of vestibular dysfunction and dizziness are discussed extensively with the patient. The possible differential diagnoses are reviewed. Questions are invited and answered.   - Referred to physical therapy for vestibular rehabilitation and balance exercises.  Impacted cerumen, bilateral - Otomicroscopy with bilateral cerumen  disimpaction. - Advised her to avoid self-removal of cerumen.       Guillermo Nehring W Cosby Proby 04/26/2024, 3:56 PM      [1]  Allergies Allergen Reactions   Contrast Media [Iodinated Contrast Media] Hives, Itching and Other (See Comments)    Felt like she was going to faint    Adhesive [Tape] Other (See Comments)    Prefers paper tape   Sulfonamide Derivatives Other (See Comments)    Reaction: unknown

## 2024-04-28 ENCOUNTER — Telehealth: Payer: Self-pay

## 2024-04-28 NOTE — Telephone Encounter (Signed)
 Copied from CRM [id number]. Topic: Appointments - Scheduling Inquiry for Clinic >> Apr 28, 2024 11:11 AM Brandy Peterson wrote: Reason for CRM:  Patient is requesting to become a patient of DR. Plotnikov, advised that he is not currently accepting new patients. She states she has seen him many times and wants to know if he will consider her request.

## 2024-04-28 NOTE — Telephone Encounter (Signed)
 Ok with me, but would need to be ok with Dr Garald as well.   Thanks!

## 2024-05-10 ENCOUNTER — Ambulatory Visit: Admitting: Physical Therapy

## 2024-05-15 ENCOUNTER — Ambulatory Visit: Payer: Self-pay

## 2024-05-25 ENCOUNTER — Ambulatory Visit: Payer: Self-pay | Admitting: Physical Therapy

## 2024-06-16 ENCOUNTER — Inpatient Hospital Stay: Admitting: Hematology

## 2024-06-16 ENCOUNTER — Inpatient Hospital Stay

## 2024-06-23 ENCOUNTER — Inpatient Hospital Stay

## 2024-06-23 ENCOUNTER — Inpatient Hospital Stay: Admitting: Hematology

## 2024-10-25 ENCOUNTER — Ambulatory Visit (INDEPENDENT_AMBULATORY_CARE_PROVIDER_SITE_OTHER): Admitting: Otolaryngology
